# Patient Record
Sex: Male | Born: 1945 | Race: White | Hispanic: Refuse to answer | Smoking: Never smoker
Health system: Southern US, Community
[De-identification: ages and names within clinical notes are randomized; demographics above are authoritative.]

## PROBLEM LIST (undated history)

## (undated) DIAGNOSIS — C61 Malignant neoplasm of prostate: Secondary | ICD-10-CM

## (undated) DIAGNOSIS — C4490 Unspecified malignant neoplasm of skin, unspecified: Secondary | ICD-10-CM

## (undated) DIAGNOSIS — D0321 Melanoma in situ of right ear and external auricular canal: Secondary | ICD-10-CM

## (undated) HISTORY — PX: MOHS SURGERY: SUR867

## (undated) HISTORY — PX: PROSTATECTOMY: SHX69

---

## 2000-09-12 ENCOUNTER — Encounter (INDEPENDENT_AMBULATORY_CARE_PROVIDER_SITE_OTHER): Payer: Self-pay | Admitting: Specialist

## 2000-09-12 ENCOUNTER — Other Ambulatory Visit: Admission: RE | Admit: 2000-09-12 | Discharge: 2000-09-12 | Payer: Self-pay | Admitting: Urology

## 2003-12-28 ENCOUNTER — Ambulatory Visit (HOSPITAL_COMMUNITY): Admission: RE | Admit: 2003-12-28 | Discharge: 2003-12-28 | Payer: Self-pay | Admitting: Gastroenterology

## 2008-01-26 ENCOUNTER — Ambulatory Visit: Admission: RE | Admit: 2008-01-26 | Discharge: 2008-02-25 | Payer: Self-pay | Admitting: Radiation Oncology

## 2008-05-13 ENCOUNTER — Ambulatory Visit: Admission: RE | Admit: 2008-05-13 | Discharge: 2008-08-11 | Payer: Self-pay | Admitting: Radiation Oncology

## 2008-08-15 ENCOUNTER — Ambulatory Visit: Admission: RE | Admit: 2008-08-15 | Discharge: 2008-08-24 | Payer: Self-pay | Admitting: Radiation Oncology

## 2010-10-18 ENCOUNTER — Other Ambulatory Visit (HOSPITAL_COMMUNITY): Payer: Self-pay | Admitting: Otolaryngology

## 2010-10-18 ENCOUNTER — Encounter (HOSPITAL_COMMUNITY)
Admission: RE | Admit: 2010-10-18 | Discharge: 2010-10-18 | Disposition: A | Discharge status: Home / Self Care | Payer: Medicare Other | Source: Ambulatory Visit | Attending: Otolaryngology | Admitting: Otolaryngology

## 2010-10-18 LAB — URINALYSIS, ROUTINE W REFLEX MICROSCOPIC
Bilirubin Urine: NEGATIVE
Glucose, UA: NEGATIVE mg/dL
Hgb urine dipstick: NEGATIVE
Ketones, ur: NEGATIVE mg/dL
Nitrite: NEGATIVE
Protein, ur: NEGATIVE mg/dL
Specific Gravity, Urine: 1.026 (ref 1.005–1.030)
Urobilinogen, UA: 0.2 mg/dL (ref 0.0–1.0)
pH: 6.5 (ref 5.0–8.0)

## 2010-10-18 LAB — BASIC METABOLIC PANEL
BUN: 19 mg/dL (ref 6–23)
CO2: 27 mEq/L (ref 19–32)
Calcium: 8.8 mg/dL (ref 8.4–10.5)
Chloride: 108 mEq/L (ref 96–112)
Creatinine, Ser: 0.85 mg/dL (ref 0.4–1.5)
GFR calc Af Amer: >60 mL/min (ref >60)
GFR calc non Af Amer: >60 mL/min (ref >60)
Glucose, Bld: 123 mg/dL — ABNORMAL HIGH (ref 70–99)
Potassium: 4 mEq/L (ref 3.5–5.1)
Sodium: 138 mEq/L (ref 135–145)

## 2010-10-18 LAB — SURGICAL PCR SCREEN
MRSA, PCR: NEGATIVE
Staphylococcus aureus: NEGATIVE

## 2010-10-18 LAB — CBC
HCT: 40.4 % (ref 39.0–52.0)
Hemoglobin: 13.7 g/dL (ref 13.0–17.0)
MCH: 32.1 pg (ref 26.0–34.0)
MCHC: 33.9 g/dL (ref 30.0–36.0)
MCV: 94.6 fL (ref 78.0–100.0)
Platelets: 210 10*3/uL (ref 150–400)
RBC: 4.27 MIL/uL (ref 4.22–5.81)
RDW: 12.6 % (ref 11.5–15.5)
WBC: 3.6 10*3/uL — ABNORMAL LOW (ref 4.0–10.5)

## 2010-10-18 IMAGING — CR DG CHEST 2V
2 series · 2 of 2 positions shown · non-contrast
Comparison: None

CLINICAL DATA: Preop for nasal polyp biopsy.

CHEST - 2 VIEW

[view not recorded (1 of 2)]
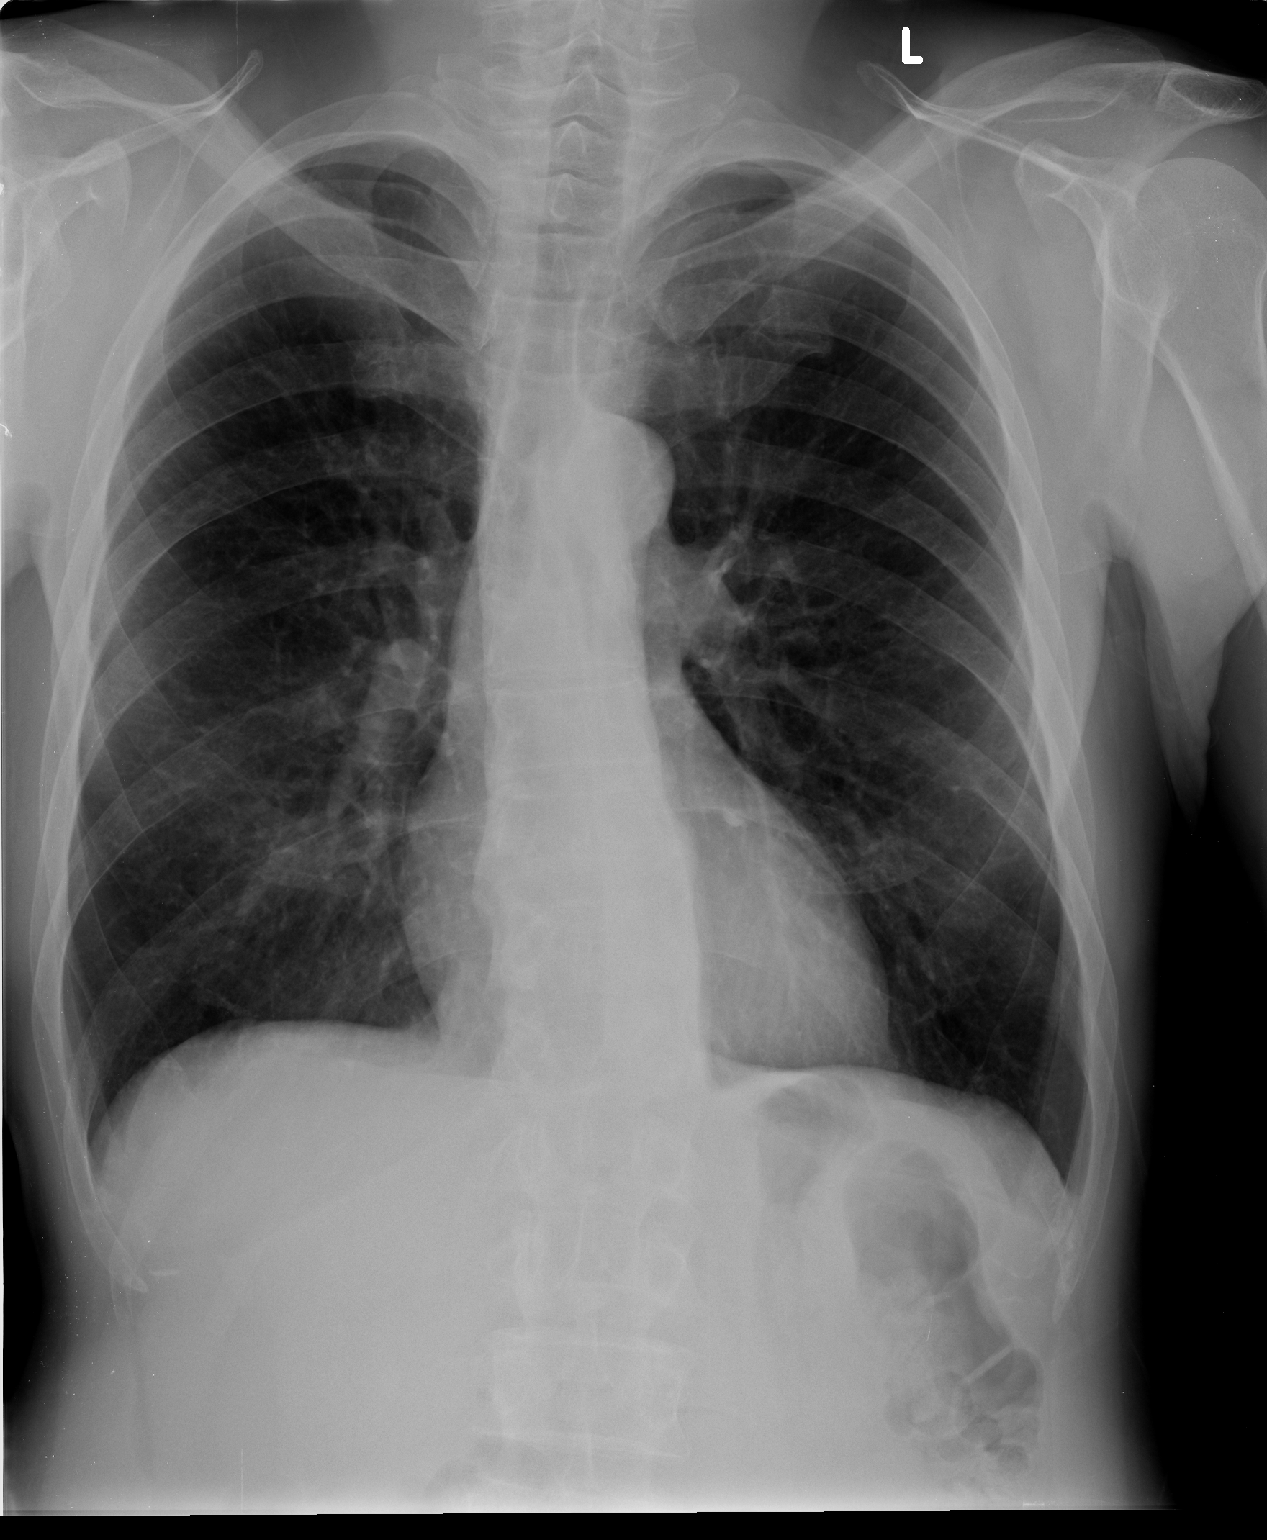

[view not recorded (2 of 2)]
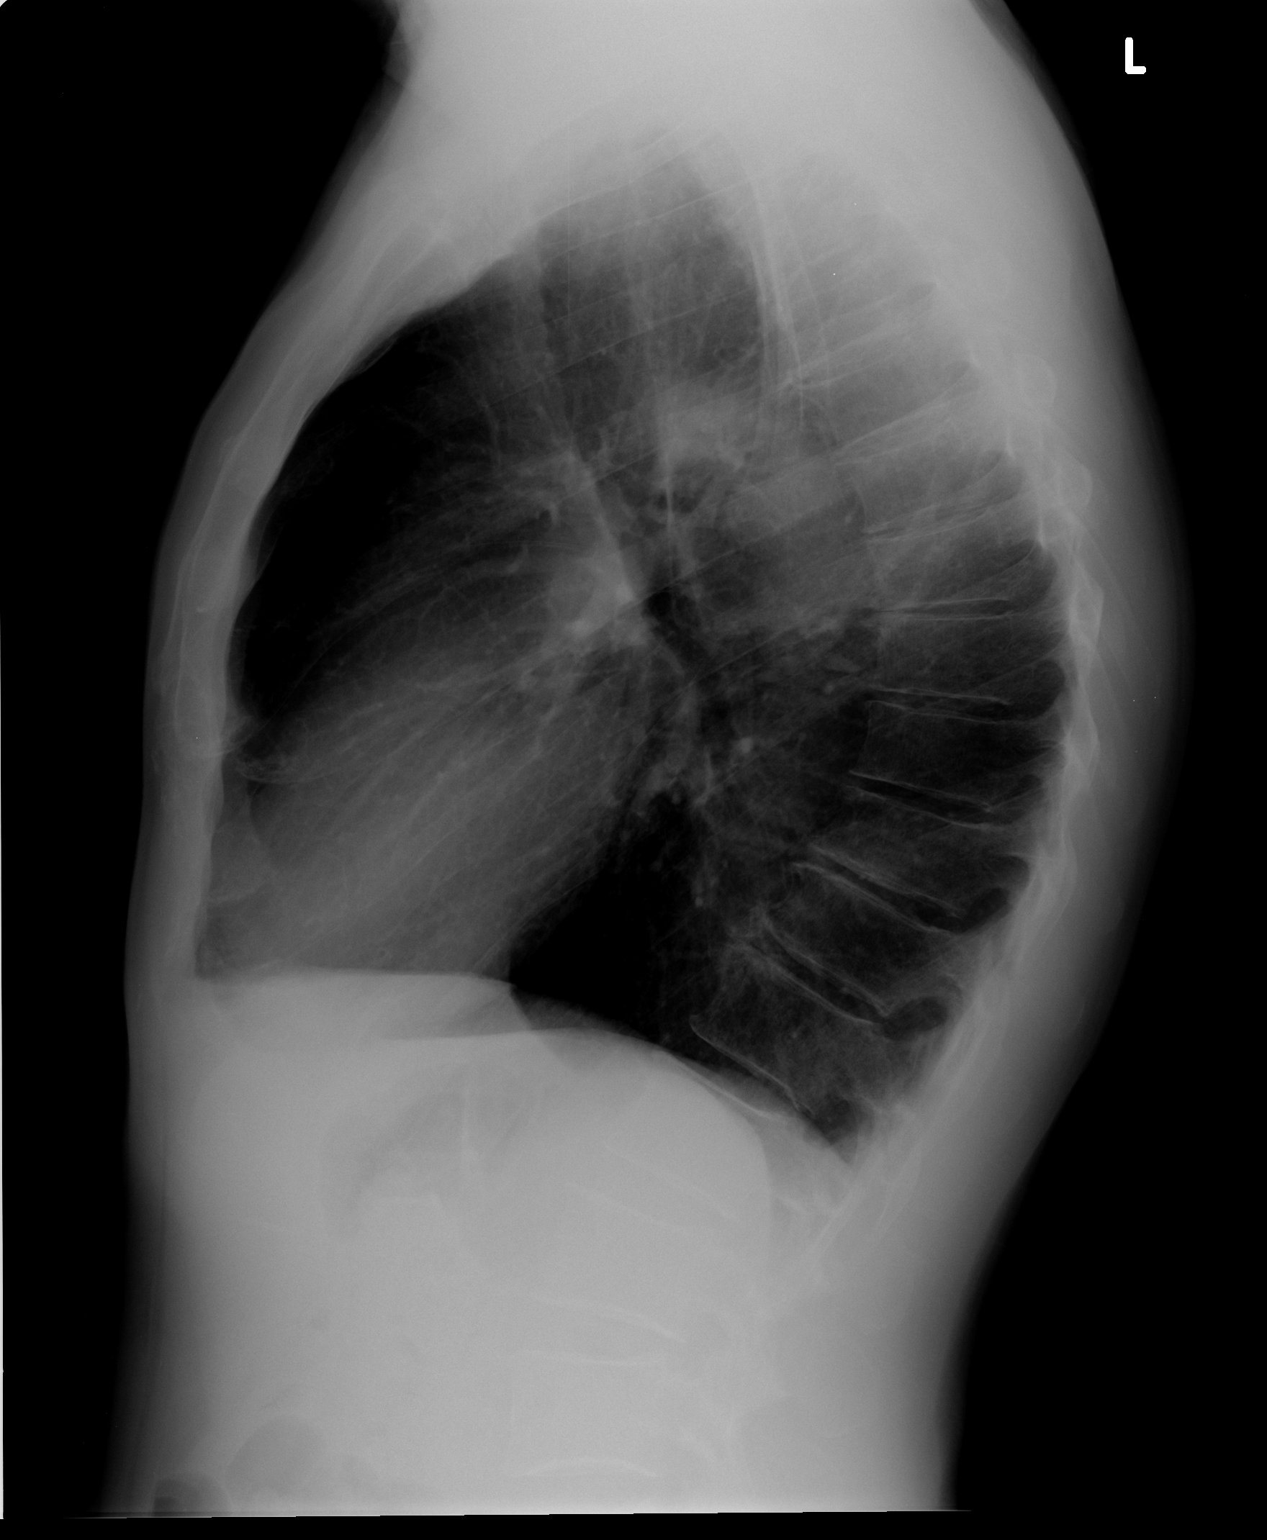

[2 of 2 positions shown; findings below may reference images not displayed]

FINDINGS: The cardiac silhouette, mediastinal and hilar contours
are within normal limits.  The lungs are clear.  The bony thorax is
intact
IMPRESSION: No acute cardiopulmonary findings.

## 2010-10-25 ENCOUNTER — Other Ambulatory Visit: Payer: Self-pay | Admitting: Otolaryngology

## 2010-10-25 ENCOUNTER — Ambulatory Visit (HOSPITAL_COMMUNITY)
Admission: RE | Admit: 2010-10-25 | Discharge: 2010-10-25 | Disposition: A | Discharge status: Home / Self Care | Payer: Medicare Other | Source: Ambulatory Visit | Attending: Otolaryngology | Admitting: Otolaryngology

## 2010-10-25 DIAGNOSIS — Z85840 Personal history of malignant neoplasm of eye: Secondary | ICD-10-CM | POA: Insufficient documentation

## 2010-10-25 DIAGNOSIS — Z01812 Encounter for preprocedural laboratory examination: Secondary | ICD-10-CM | POA: Insufficient documentation

## 2010-10-25 DIAGNOSIS — Z01818 Encounter for other preprocedural examination: Secondary | ICD-10-CM | POA: Insufficient documentation

## 2010-10-25 DIAGNOSIS — J338 Other polyp of sinus: Secondary | ICD-10-CM | POA: Insufficient documentation

## 2010-11-01 NOTE — H&P (Signed)
NAME:  Paul Vasquez, Paul Vasquez NO.:  [id number]  MEDICAL RECORD NO.:  [id number]           PATIENT TYPE:  LOCATION:                                 FACILITY:  PHYSICIAN:  Hermelinda Medicus, M.D.   DATE OF BIRTH:  1945/09/08  DATE OF ADMISSION: DATE OF DISCHARGE:                             HISTORY & PHYSICAL   This patient is a 65 year old male who has had a 2-year history of left maxillary sinusitis.  He had been seen by another ear, nose, and throat physician as well and has been followed there, but he brought his films to me and wanted to see if we can get this problem solved.  He on examination has a very aggressive looking left maxillary ethmoid region, nasal polyp, and on reviewing of his CAT scan, he also had involvement of left maxillary sinus.  There appeared to be erosion of the bone in a 64 unilateral lesion.  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  He did not have a lot of pain, but he was concerned about these sinusitis infections and with the nasal obstruction and with the persistent congestion and sinus infections, I treated him with antibiotics with little improvement, and after reviewing his films, my recommendation is that he would have a left maxillary sinus ostial enlargement with excisional biopsy with frozen section of the nasal maxillary, ethmoid polyp and clearing of this maxillary sinus, would drain it  appropriately.  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  He has not had any other difficulties with this.  He has also had other past surgeries.  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  He also had a prostate surgery for cancer, elbow ORIF on the right, and right hernia. He also has a history of anxiety.  He had apparently some bleeding from his left adrenal gland which has been followed up by his medical  doctor.  PHYSICAL EXAMINATION:  VITAL SIGNS:  Blood pressure of 142/78, pulse 70. The ears are clear. HEENT:  The tympanic membranes look excellent and move well.  External ear canals are also clear.  The right nose is clear.  He has a small spur on the right side, but I felt it was not pertinent to this problem. The left side has the blocked nasal airway with a large irregular- looking nasal polyp which extends into the maxillary sinus causing maxillary sinusitis with the appearance of inverted papilloma.  The nasopharynx is clear of any ulceration, mass, or lesion.  His larynx is clear.  True cords, false cords, epiglottis, base of tongue, lateral pharyngeal walls are completely clear of ulceration, mass, or lesion and true cord mobility, gag reflex, tongue mobility, EOMs, facial nerve are all symmetrical.  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 GENERAL:  Oriented x3. CHEST:  Clear.  No rales, rhonchi, or wheezes. CARDIOVASCULAR:  Normal S1 and S2.  No murmurs or gallops.  INITIAL DIAGNOSES: 1. 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 Plan is to do functional endoscopic sinus surgery, biopsy excision of this lesion, frozen section to rule out and then if it is an inverted papilloma, excise this through endoscopic procedure. 1. Right eye enucleation for melanoma. 2. History of a adrenal tumor, treated. 3. Right elbow and right hernia repair.  Our initial plan is to do the functional endoscopic sinus surgery, biopsy, frozen section, excise the polyp and if it is inverted papilloma, then aggressively remove this via endoscopic.          ______________________________ Hermelinda Medicus, M.D.     JC/MEDQ  D:  10/25/2010  T:  10/26/2010  Job:  [id number]  cc:   Gloriajean Dell. Andrey Campanile, M.D.  Electronically Signed by Hermelinda Medicus M.D. on 11/01/2010 10:23:40 AM

## 2010-11-01 NOTE — Op Note (Signed)
NAME:  Paul Vasquez, Paul Vasquez NO.:  [id number]  MEDICAL RECORD NO.:  [id number]           PATIENT TYPE:  LOCATION:                                 FACILITY:  PHYSICIAN:  Hermelinda Medicus, M.D.   DATE OF BIRTH:  October 24, 1945  DATE OF PROCEDURE: DATE OF DISCHARGE:                              OPERATIVE REPORT   PREOPERATIVE DIAGNOSIS:  Left maxillary sinus polyp involving the nasal vestibule as well as the sinus itself and next to the middle turbinate, ethmoid sinus appears to be clear, and rule-out inverted papilloma.  POSTOPERATIVE DIAGNOSIS:  Left maxillary sinus polyp involving the nasal vestibule as well as the sinus itself and next to the middle turbinate, ethmoid sinus appears to be clear, and rule-out inverted papilloma.  OPERATOR:  Hermelinda Medicus, MD  PROCEDURE:  Functional endoscopic sinus surgery with a frozen section biopsy of left maxillary sinus and nasal polyp with functional endoscopic review of the entire maxillary sinus antrostomy and then excision and removal of this area of the polyp via backbiting forceps and Blakesley-Wilde under the zero 130-degree scope.  ANESTHESIA:  Local MAC, 1% Xylocaine with epinephrine 4 mL topical cocaine 200 mg.  ANESTHESIOLOGIST:  Dr. Jacklynn Bue.  PROCEDURE:  The patient was placed in supine position under local MAC anesthesia, being very careful, and considering he has just one eye the left was functioning and we are operating on this left side.  The nose was anesthetized using 1% Xylocaine with epinephrine and topical cocaine 200 mg.  The polyp was visualized very clearly and this was immediately excised and sent for frozen section to be seen by Dr. Nedra Hai and she called back on frozen section with a diagnosis of inflammatory and fungal, but no evidence of tumor and no evidence of inverted papilloma.  We during this time waiting, we opened the left maxillary sinus ostium using the backbiting forceps and angled  Blakesley-Wilde, we viewed inside the sinus with a zero 130-degree scope and the mucous membrane inside was thickened, but did not appear to have this angry irregular appearance that the medial wall of maxillary sinus showed.  We obviously took more tissue for a permanent section and we did an antrostomy to be able to view the entire sinus and once this was completed we also medialized the middle turbinate on the left and also reduced the both sides of the inferior turbinates and used the awl met at 12 to cauterize the anterior portion of the turbinates.  Once this was completed, we placed a small piece of Gelfoam up by the area of the maxillary sinus and natural ostium and then placed a 12-mm Merocel pack on that left side.  The patient tolerated procedure very well.  His vision is totally normal and he is to be permitted to go home, talk to his wife about the care postop.  He is to return to see me in 1 day and I will remove this packing tomorrow in the office.  This follow up then will be in 1 week, 3 weeks, 6 weeks, 3 months, 6 months, and a year.  ______________________________ Hermelinda Medicus, M.D.     JC/MEDQ  D:  10/25/2010  T:  10/26/2010  Job:  [id number]  cc:   Gloriajean Dell. Andrey Campanile, M.D.  Electronically Signed by Hermelinda Medicus M.D. on 11/01/2010 10:23:44 AM

## 2010-12-28 NOTE — Op Note (Signed)
NAME:  Paul Vasquez, Paul Vasquez                       ACCOUNT NO.:  [id number]   MEDICAL RECORD NO.:  [id number]                   PATIENT TYPE:  AMB   LOCATION:  ENDO                                 FACILITY:  [facility]   PHYSICIAN:  Anselmo Rod, M.D.               DATE OF BIRTH:  [birth year]   DATE OF PROCEDURE:  12/28/2003  DATE OF DISCHARGE:                                 OPERATIVE REPORT   PROCEDURE PERFORMED:  Screening colonoscopy.   ENDOSCOPIST:  Charna Elizabeth, M.D.   INSTRUMENT USED:  Olympus video colonoscope.   INDICATIONS FOR PROCEDURE:  The patient is a 65 year old white male with a  personal history of prostate cancer and melanoma, status post enucleation of  the right eye in the past with a prosthetic right eye at the present time,  undergoing screening colonoscopy to rule out colonic polyps, masses, etc.   PREPROCEDURE PREPARATION:  Informed consent was procured from the patient.  The patient was fasted for eight hours prior to the procedure and prepped  with a bottle of magnesium citrate and a gallon of GoLYTELY the night prior  to the procedure.   PREPROCEDURE PHYSICAL:  The patient had stable vital signs.  Neck supple.  Chest clear to auscultation.  S1 and S2 regular.  Abdomen soft with normal  bowel sounds.   DESCRIPTION OF PROCEDURE:  The patient was placed in left lateral decubitus  position and sedated with 90 mg of Demerol and 9 mg of Versed intravenously.  Once the patient was adequately sedated and maintained on low flow oxygen  and continuous cardiac monitoring, the Olympus video colonoscope was  advanced from the rectum to the cecum.  The appendicular orifice and  ileocecal valve were clearly visualized and photographed.  There was some  residual debris in the colon and multiple washes were done.  The patient's  position was changed from the left lateral to the supine position with  gentle application of abdominal pressure to reach the cecum.  No masses,  polyps, erosions, ulcerations or diverticula were seen.  Small internal  hemorrhoids were seen on retroflexion in the rectum.  The patient tolerated  the procedure well without immediate complications.   IMPRESSION:  Normal colonoscopy up to the cecum except for small internal  hemorrhoids.   RECOMMENDATIONS:  1. Continue high fiber diet with liberal fluid intake.  2. Repeat colorectal cancer screening is recommended in the next five years     unless the patient develops any abnormal symptoms in the interim.  3. Outpatient followup as need arises in the future.                                               Anselmo Rod, M.D.    JNM/MEDQ  D:  12/28/2003  T:  12/28/2003  Job:  [id number]   cc:   Gloriajean Dell. Andrey Campanile, M.D.  [address]  [place]  [place] [address]  Fax: [phone]

## 2015-10-11 DIAGNOSIS — I10 Essential (primary) hypertension: Secondary | ICD-10-CM | POA: Insufficient documentation

## 2015-10-11 DIAGNOSIS — L570 Actinic keratosis: Secondary | ICD-10-CM | POA: Insufficient documentation

## 2015-10-11 DIAGNOSIS — Z0000 Encounter for general adult medical examination without abnormal findings: Secondary | ICD-10-CM | POA: Insufficient documentation

## 2015-10-11 DIAGNOSIS — Q809 Congenital ichthyosis, unspecified: Secondary | ICD-10-CM | POA: Insufficient documentation

## 2015-10-11 DIAGNOSIS — L300 Nummular dermatitis: Secondary | ICD-10-CM | POA: Insufficient documentation

## 2015-10-11 DIAGNOSIS — C61 Malignant neoplasm of prostate: Secondary | ICD-10-CM | POA: Insufficient documentation

## 2015-10-11 DIAGNOSIS — H548 Legal blindness, as defined in USA: Secondary | ICD-10-CM | POA: Insufficient documentation

## 2017-05-22 DIAGNOSIS — Z9001 Acquired absence of eye: Secondary | ICD-10-CM | POA: Insufficient documentation

## 2017-09-08 ENCOUNTER — Other Ambulatory Visit (HOSPITAL_COMMUNITY): Payer: Self-pay | Admitting: Urology

## 2017-09-08 DIAGNOSIS — C61 Malignant neoplasm of prostate: Secondary | ICD-10-CM

## 2017-10-17 ENCOUNTER — Encounter (HOSPITAL_COMMUNITY)
Admission: RE | Admit: 2017-10-17 | Discharge: 2017-10-17 | Disposition: A | Discharge status: Home / Self Care | Payer: Medicare Other | Source: Ambulatory Visit | Attending: Urology | Admitting: Urology

## 2017-10-17 DIAGNOSIS — C61 Malignant neoplasm of prostate: Secondary | ICD-10-CM | POA: Insufficient documentation

## 2017-10-17 IMAGING — NM NM BONE WHOLE BODY
2 series · 2 of 2 positions shown · non-contrast
Comparison: None.

CLINICAL DATA: 71-year-old male with prostate cancer.

EXAM:
NUCLEAR MEDICINE WHOLE BODY BONE SCAN
TECHNIQUE: Whole body anterior and posterior images were obtained approximately
3 hours after intravenous injection of radiopharmaceutical.
RADIOPHARMACEUTICALS:  20.9 mCi [NM] MDP IV

[Series 1: whole body · 2.66mm/px · 1 of 1 slices shown (1 of 2)]
[im 1/1]
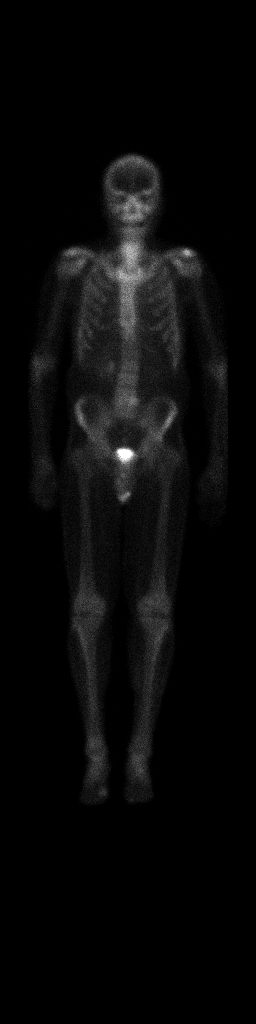

[Series 1: whole body · 2.66mm/px · 1 of 1 slices shown (2 of 2)]
[im 1/1]
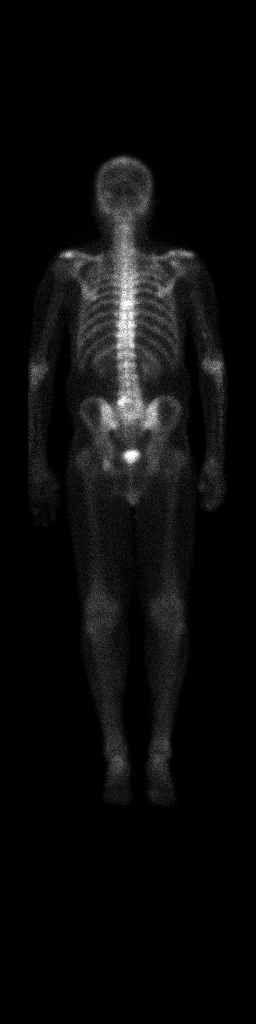

[2 of 2 positions shown; findings below may reference images not displayed]

FINDINGS: Focal increased activity within the posterior LEFT pubis/ischium
noted.

Focal increased activity within the posterior LEFT LOWER lumbar
spine noted.

Focal increased activity in the region of the distal LEFT
clavicle/AC joint noted.

No other focal areas of abnormal bony activity noted.
IMPRESSION: Focal areas of increased activity within the posterior LEFT
pubis/ischium, posterior LEFT LOWER lumbar spine and distal LEFT
clavicle/AC joint. Recommend radiographs of these areas for
evaluation of metastatic disease.

## 2017-10-17 MED ORDER — TECHNETIUM TC 99M MEDRONATE IV KIT
20.9000 | PACK | Freq: Once | INTRAVENOUS | Status: AC | PRN
  Administered 2017-10-17: 20.9 via INTRAVENOUS

## 2018-06-15 ENCOUNTER — Other Ambulatory Visit: Payer: Self-pay | Admitting: Family Medicine

## 2018-06-15 DIAGNOSIS — M810 Age-related osteoporosis without current pathological fracture: Secondary | ICD-10-CM

## 2018-08-20 ENCOUNTER — Ambulatory Visit
Admission: RE | Admit: 2018-08-20 | Discharge: 2018-08-20 | Disposition: A | Discharge status: Home / Self Care | Payer: Medicare Other | Source: Ambulatory Visit | Attending: Family Medicine | Admitting: Family Medicine

## 2018-08-20 DIAGNOSIS — M810 Age-related osteoporosis without current pathological fracture: Secondary | ICD-10-CM

## 2018-10-04 DIAGNOSIS — M810 Age-related osteoporosis without current pathological fracture: Secondary | ICD-10-CM | POA: Insufficient documentation

## 2019-03-31 DIAGNOSIS — D485 Neoplasm of uncertain behavior of skin: Secondary | ICD-10-CM | POA: Insufficient documentation

## 2019-04-29 ENCOUNTER — Encounter: Payer: Self-pay | Admitting: *Deleted

## 2019-04-30 ENCOUNTER — Other Ambulatory Visit: Payer: Self-pay

## 2019-04-30 ENCOUNTER — Ambulatory Visit
Admission: RE | Admit: 2019-04-30 | Discharge: 2019-04-30 | Disposition: A | Discharge status: Home / Self Care | Payer: Medicare Other | Source: Ambulatory Visit | Attending: Radiation Oncology | Admitting: Radiation Oncology

## 2019-04-30 ENCOUNTER — Encounter: Payer: Self-pay | Admitting: Radiation Oncology

## 2019-04-30 VITALS — Ht 67.0 in | Wt 138.0 lb

## 2019-04-30 DIAGNOSIS — C44329 Squamous cell carcinoma of skin of other parts of face: Secondary | ICD-10-CM

## 2019-04-30 DIAGNOSIS — C44320 Squamous cell carcinoma of skin of unspecified parts of face: Secondary | ICD-10-CM

## 2019-04-30 HISTORY — DX: Unspecified malignant neoplasm of skin, unspecified: C44.90

## 2019-04-30 HISTORY — DX: Melanoma in situ of right ear and external auricular canal: D03.21

## 2019-04-30 HISTORY — DX: Malignant neoplasm of prostate: C61

## 2019-04-30 NOTE — Progress Notes (Signed)
See progress note under physician encounter. 

## 2019-04-30 NOTE — Progress Notes (Signed)
Radiation Oncology         [phone] ________________________________  Initial outpatient Consultation - Conducted via Telephone due to current COVID-19 concerns for limiting patient exposure  Name: Paul Vasquez MRN: [id number]  Date: 04/30/2019  DOB: August 04, 1946  CC:[name], MD  [name], MD   REFERRING PHYSICIAN: [name], MD  DIAGNOSIS: 73 y.o. 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 Squamous cell carcinoma of skin of temple region  C44.320   2. Squamous cell carcinoma of face  C44.320     HISTORY OF PRESENT ILLNESS: Paul Vasquez is a 73 y.o. male with a diagnosis of invasive squamous cell skin cancer. He has a history of prostate cancer, s/p radical retroperitoneal prostatectomy with Dr. Cherly Hensen at [facility] in 2003 followed by salvage radiation to the prostate fossa in 2009 here in our cinic.  He has continued in follow up with Dr. Alinda Money since that time and was recently started on Lupron ADT for rising PSA in 05/2018 which he tolerates well. He presented to his PCP with a tender spot on his right temple that continued to flare up over approximately 6 month period. He had a shave biopsy performed on 04/01/2019 which revealed invasive squamous cell carcinoma, extending to deep and lateral margins. He was referred to Dr. Danielle Dess at [place], who performed Mohs surgery on 04/22/2019. Final pathology confirmed pT2b invasive squamous cell carcinoma with perineural invasion but all margins free of tumor.    Of note, the patient reports a history of melanoma behind the right retina approximately 40+ years ago which was treated with surgical removal of the right eye with replacement of a prosthetic eye.  The patient reviewed the biopsy results with his dermatologist and he has kindly been referred today for discussion of potential radiation treatment options.  PREVIOUS RADIATION  THERAPY: Yes  07/2008 - 08/2008: Prostate Fossa / 68.4 Gy in 38 fractions  PAST MEDICAL HISTORY:  Past Medical History:  Diagnosis Date  . Melanoma in situ of ear, right ([place])   . Prostate cancer ([place])   . Skin cancer       PAST SURGICAL HISTORY: Past Surgical History:  Procedure Laterality Date  . MOHS SURGERY    . PROSTATECTOMY      FAMILY HISTORY:  Family History  Problem Relation Age of Onset  . Multiple myeloma Father     SOCIAL HISTORY:  Social History   Socioeconomic History  . Marital status: Married    Spouse name: Not on file  . Number of children: Not on file  . Years of education: Not on file  . Highest education level: Not on file  Occupational History    Comment: retired  Scientific laboratory technician  . Financial resource strain: Not on file  . Food insecurity    Worry: Not on file    Inability: Not on file  . Transportation needs    Medical: Not on file    Non-medical: Not on file  Tobacco Use  . Smoking status: Never Smoker  . Smokeless tobacco: Never Used  Substance and Sexual Activity  . Alcohol use: Not Currently  . Drug use: Never  . Sexual activity: Not Currently  Lifestyle  . Physical activity    Days per week: Not on file    Minutes per session: Not on file  . Stress: Not on file  Relationships  . Social  connections    Talks on phone: Not on file    Gets together: Not on file    Attends religious service: Not on file    Active member of club or organization: Not on file    Attends meetings of clubs or organizations: Not on file    Relationship status: Not on file  . Intimate partner violence    Fear of current or ex partner: Not on file    Emotionally abused: Not on file    Physically abused: Not on file    Forced sexual activity: Not on file  Other Topics Concern  . Not on file  Social History Narrative  . Not on file    ALLERGIES: Amoxicillin and Sulfamethoxazole  MEDICATIONS:  Current Outpatient Medications  Medication Sig  Dispense Refill  . calcium-vitamin D (OSCAL WITH D) 500-200 MG-UNIT tablet Take 1 tablet by mouth.    . NON FORMULARY Raw Calcium daily    . NON FORMULARY Immune stimulator daily    . NON FORMULARY Skeletal strength daily    . NON FORMULARY Growth factor daily    . NON FORMULARY Wheat grass daily    . Probiotic Product (SOLUBLE FIBER/PROBIOTICS PO) Take by mouth.    . vitamin C (ASCORBIC ACID) 500 MG tablet Take 500 mg by mouth 2 (two) times daily.     No current facility-administered medications for this encounter.     REVIEW OF SYSTEMS:  On review of systems, the patient reports that he is doing well overall.  He feels that his surgical incision is healing appropriately and denies pain or active drainage or bleeding.  He does report brow proptosis on the right since the time of surgery.  He denies any chest pain, shortness of breath, cough, fevers, chills, night sweats, or recent unintended weight changes. He denies any bowel disturbances, and denies abdominal pain, nausea or vomiting. He denies any new musculoskeletal or joint aches or pains. A complete review of systems is obtained and is otherwise negative.    PHYSICAL EXAM:  Wt Readings from Last 3 Encounters:  04/30/19 138 lb (62.6 kg)   Temp Readings from Last 3 Encounters:  No data found for Temp   BP Readings from Last 3 Encounters:  No data found for BP   Pulse Readings from Last 3 Encounters:  No data found for Pulse   Pain Assessment Pain Score: 1  Pain Frequency: Occasional Pain Loc: Face(touch of sensitivity right temple s/p  MOH's surgery)/10  Unable to assess due to telephone consult visit format   KPS = 90  100 - Normal; no complaints; no evidence of disease. 90   - Able to carry on normal activity; minor signs or symptoms of disease. 80   - Normal activity with effort; some signs or symptoms of disease. 56   - Cares for self; unable to carry on normal activity or to do active work. 60   - Requires  occasional assistance, but is able to care for most of his personal needs. 50   - Requires considerable assistance and frequent medical care. 30   - Disabled; requires special care and assistance. 36   - Severely disabled; hospital admission is indicated although death not imminent. 73   - Very sick; hospital admission necessary; active supportive treatment necessary. 10   - Moribund; fatal processes progressing rapidly. 0     - Dead  Karnofsky DA, Abelmann WH, Craver LS and Burchenal [facility] [phone]) The use of the nitrogen  mustards in the palliative treatment of carcinoma: with particular reference to bronchogenic carcinoma Cancer 1 [phone]  LABORATORY DATA:  Lab Results  Component Value Date   WBC 3.6 (L) 10/18/2010   HGB 13.7 10/18/2010   HCT 40.4 10/18/2010   MCV 94.6 10/18/2010   PLT 210 10/18/2010   Lab Results  Component Value Date   NA 138 10/18/2010   K 4.0 10/18/2010   CL 108 10/18/2010   CO2 27 10/18/2010   No results found for: ALT, AST, GGT, ALKPHOS, BILITOT   RADIOGRAPHY: No results found.    IMPRESSION/PLAN: 1. 73 y.o. gentleman with Stage T2b invasive, cutaneous squamous cell carcinoma of the right temple.  Today, we talked to the patient about the findings and workup thus far. We discussed the natural history of invasive squamous cell carcinoma and general treatment, highlighting the role of adjuvant radiotherapy in the management. We discussed the available radiation techniques, and focused on the details of logistics and delivery.  The recommendation is to proceed with a 6-week course of daily radiotherapy focused on the surgical site at the right temple once he is 3 to 4 weeks postop.  We reviewed the anticipated acute and late sequelae associated with radiation in this setting. The patient was encouraged to ask questions that were answered to his stated satisfaction.  At the conclusion of our conversation, the patient is interested in proceeding with adjuvant  radiation therapy directed to the surgical site at the right temple. He has provided verbal consent today and is scheduled for CT simulation/treatment planning on 05/11/2019 in anticipation of beginning his daily radiation treatments on 05/18/2019.  He appears to have a good understanding of his disease and our recommendations which are of curative intent.  He is comfortable and in agreement with the stated plan.  We will share our discussion with Dr. Danielle Dess and Dr. Kathryne Eriksson.  He knows to call at anytime with any questions or concerns in the interim.  Given current concerns for patient exposure during the COVID-19 pandemic, this encounter was conducted via telephone. The patient was notified in advance and was offered a MyChart meeting to allow for face to face communication but unfortunately reported that he did not have the appropriate resources/technology to support such a visit and instead preferred to proceed with telephone consult. The patient has given verbal consent for this type of encounter. The time spent during this encounter was 55 minutes. The attendants for this meeting include [name] MD, Ashlyn Bruning PA-C, Katie Larkfield[place], and patient, Camilo "CHARLIE" Lysbeth Galas. During the encounter, [name] MD, Ashlyn Bruning PA-C, and scribe, Wilburn Mylar were located at [place].  Patient, Abanoub "CHARLIE" Malerba was located at home.    [name], PA-C    [name], MD  [facility] Direct Dial: [phone]  Fax: [phone] [email or web address]  Skype  [email or web address]  This document serves as a record of services personally performed by [name], MD and Freeman Caldron, PA-C. It was created on their behalf by Wilburn Mylar, a trained medical scribe. The creation of this record is based on the scribe's personal observations and the provider's statements to them. This document  has been checked and approved by the attending provider.
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
## 2019-05-04 ENCOUNTER — Telehealth: Payer: Self-pay | Admitting: *Deleted

## 2019-05-04 NOTE — Telephone Encounter (Signed)
Faxed copy of consult note to Dr. Janan Ridge of [place] [phone].

## 2019-05-11 ENCOUNTER — Ambulatory Visit
Admission: RE | Admit: 2019-05-11 | Discharge: 2019-05-11 | Disposition: A | Discharge status: Home / Self Care | Payer: Medicare Other | Source: Ambulatory Visit | Attending: Radiation Oncology | Admitting: Radiation Oncology

## 2019-05-11 ENCOUNTER — Other Ambulatory Visit: Payer: Self-pay

## 2019-05-11 DIAGNOSIS — C44320 Squamous cell carcinoma of skin of unspecified parts of face: Secondary | ICD-10-CM | POA: Diagnosis not present

## 2019-05-13 NOTE — Progress Notes (Signed)
  Radiation Oncology         [phone]) [phone] ________________________________  Name: Paul Vasquez MRN: [id number]  Date: 05/11/2019  DOB: 07/06/1946  SIMULATION AND TREATMENT PLANNING NOTE    ICD-10-CM   1. 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  Identity was confirmed.  All relevant records and images related to the planned course of therapy were reviewed.  The patient freely provided informed written consent to proceed with treatment after reviewing the details related to the planned course of therapy. The consent form was witnessed and verified by the simulation staff.  Then, the patient was set-up in a stable reproducible  supine position for radiation therapy.  CT images were obtained.  Surface markings were placed.  The CT images were loaded into the planning software.  Then the target and avoidance structures were contoured.  Treatment planning then occurred.  The radiation prescription was entered and confirmed.  Then, I designed and supervised the construction of a total of 3 medically necessary complex treatment devices with mask, electron cut out and bolus.  I have requested : Isodose Plan.  PLAN:  The patient will receive 60 Gy in 30 fractions.  ________________________________  Sheral Apley Tammi Klippel, M.D.

## 2019-05-17 DIAGNOSIS — C44320 Squamous cell carcinoma of skin of unspecified parts of face: Secondary | ICD-10-CM | POA: Diagnosis not present

## 2019-05-18 ENCOUNTER — Other Ambulatory Visit: Payer: Self-pay

## 2019-05-18 ENCOUNTER — Ambulatory Visit
Admission: RE | Admit: 2019-05-18 | Discharge: 2019-05-18 | Disposition: A | Discharge status: Home / Self Care | Payer: Medicare Other | Source: Ambulatory Visit | Attending: Radiation Oncology | Admitting: Radiation Oncology

## 2019-05-18 DIAGNOSIS — C44320 Squamous cell carcinoma of skin of unspecified parts of face: Secondary | ICD-10-CM | POA: Diagnosis not present

## 2019-05-19 ENCOUNTER — Ambulatory Visit
Admission: RE | Admit: 2019-05-19 | Discharge: 2019-05-19 | Disposition: A | Discharge status: Home / Self Care | Payer: Medicare Other | Source: Ambulatory Visit | Attending: Radiation Oncology | Admitting: Radiation Oncology

## 2019-05-19 ENCOUNTER — Other Ambulatory Visit: Payer: Self-pay

## 2019-05-19 DIAGNOSIS — C44320 Squamous cell carcinoma of skin of unspecified parts of face: Secondary | ICD-10-CM | POA: Diagnosis not present

## 2019-05-20 ENCOUNTER — Ambulatory Visit
Admission: RE | Admit: 2019-05-20 | Discharge: 2019-05-20 | Disposition: A | Discharge status: Home / Self Care | Payer: Medicare Other | Source: Ambulatory Visit | Attending: Radiation Oncology | Admitting: Radiation Oncology

## 2019-05-20 ENCOUNTER — Other Ambulatory Visit: Payer: Self-pay

## 2019-05-20 DIAGNOSIS — C44320 Squamous cell carcinoma of skin of unspecified parts of face: Secondary | ICD-10-CM | POA: Diagnosis not present

## 2019-05-21 ENCOUNTER — Other Ambulatory Visit: Payer: Self-pay

## 2019-05-21 ENCOUNTER — Ambulatory Visit
Admission: RE | Admit: 2019-05-21 | Discharge: 2019-05-21 | Disposition: A | Discharge status: Home / Self Care | Payer: Medicare Other | Source: Ambulatory Visit | Attending: Radiation Oncology | Admitting: Radiation Oncology

## 2019-05-21 DIAGNOSIS — C44320 Squamous cell carcinoma of skin of unspecified parts of face: Secondary | ICD-10-CM | POA: Diagnosis not present

## 2019-05-21 MED ORDER — SONAFINE EX EMUL
1.0000 "application " | Freq: Once | CUTANEOUS | Status: AC
  Administered 2019-05-21: 1 via TOPICAL

## 2019-05-24 ENCOUNTER — Other Ambulatory Visit: Payer: Self-pay

## 2019-05-24 ENCOUNTER — Ambulatory Visit
Admission: RE | Admit: 2019-05-24 | Discharge: 2019-05-24 | Disposition: A | Discharge status: Home / Self Care | Payer: Medicare Other | Source: Ambulatory Visit | Attending: Radiation Oncology | Admitting: Radiation Oncology

## 2019-05-24 DIAGNOSIS — C44320 Squamous cell carcinoma of skin of unspecified parts of face: Secondary | ICD-10-CM | POA: Diagnosis not present

## 2019-05-25 ENCOUNTER — Ambulatory Visit
Admission: RE | Admit: 2019-05-25 | Discharge: 2019-05-25 | Disposition: A | Discharge status: Home / Self Care | Payer: Medicare Other | Source: Ambulatory Visit | Attending: Radiation Oncology | Admitting: Radiation Oncology

## 2019-05-25 ENCOUNTER — Other Ambulatory Visit: Payer: Self-pay

## 2019-05-25 DIAGNOSIS — C44320 Squamous cell carcinoma of skin of unspecified parts of face: Secondary | ICD-10-CM | POA: Diagnosis not present

## 2019-05-26 ENCOUNTER — Other Ambulatory Visit: Payer: Self-pay

## 2019-05-26 ENCOUNTER — Ambulatory Visit
Admission: RE | Admit: 2019-05-26 | Discharge: 2019-05-26 | Disposition: A | Discharge status: Home / Self Care | Payer: Medicare Other | Source: Ambulatory Visit | Attending: Radiation Oncology | Admitting: Radiation Oncology

## 2019-05-26 DIAGNOSIS — C44320 Squamous cell carcinoma of skin of unspecified parts of face: Secondary | ICD-10-CM | POA: Diagnosis not present

## 2019-05-27 ENCOUNTER — Other Ambulatory Visit: Payer: Self-pay

## 2019-05-27 ENCOUNTER — Ambulatory Visit
Admission: RE | Admit: 2019-05-27 | Discharge: 2019-05-27 | Disposition: A | Discharge status: Home / Self Care | Payer: Medicare Other | Source: Ambulatory Visit | Attending: Radiation Oncology | Admitting: Radiation Oncology

## 2019-05-27 DIAGNOSIS — C44320 Squamous cell carcinoma of skin of unspecified parts of face: Secondary | ICD-10-CM | POA: Diagnosis not present

## 2019-05-28 ENCOUNTER — Ambulatory Visit
Admission: RE | Admit: 2019-05-28 | Discharge: 2019-05-28 | Disposition: A | Discharge status: Home / Self Care | Payer: Medicare Other | Source: Ambulatory Visit | Attending: Radiation Oncology | Admitting: Radiation Oncology

## 2019-05-28 ENCOUNTER — Other Ambulatory Visit: Payer: Self-pay

## 2019-05-28 DIAGNOSIS — C44320 Squamous cell carcinoma of skin of unspecified parts of face: Secondary | ICD-10-CM | POA: Diagnosis not present

## 2019-05-31 ENCOUNTER — Other Ambulatory Visit: Payer: Self-pay

## 2019-05-31 ENCOUNTER — Ambulatory Visit
Admission: RE | Admit: 2019-05-31 | Discharge: 2019-05-31 | Disposition: A | Discharge status: Home / Self Care | Payer: Medicare Other | Source: Ambulatory Visit | Attending: Radiation Oncology | Admitting: Radiation Oncology

## 2019-05-31 DIAGNOSIS — C44320 Squamous cell carcinoma of skin of unspecified parts of face: Secondary | ICD-10-CM | POA: Diagnosis not present

## 2019-06-01 ENCOUNTER — Ambulatory Visit
Admission: RE | Admit: 2019-06-01 | Discharge: 2019-06-01 | Disposition: A | Discharge status: Home / Self Care | Payer: Medicare Other | Source: Ambulatory Visit | Attending: Radiation Oncology | Admitting: Radiation Oncology

## 2019-06-01 ENCOUNTER — Other Ambulatory Visit: Payer: Self-pay

## 2019-06-01 DIAGNOSIS — C44320 Squamous cell carcinoma of skin of unspecified parts of face: Secondary | ICD-10-CM | POA: Diagnosis not present

## 2019-06-02 ENCOUNTER — Ambulatory Visit
Admission: RE | Admit: 2019-06-02 | Discharge: 2019-06-02 | Disposition: A | Discharge status: Home / Self Care | Payer: Medicare Other | Source: Ambulatory Visit | Attending: Radiation Oncology | Admitting: Radiation Oncology

## 2019-06-02 ENCOUNTER — Other Ambulatory Visit: Payer: Self-pay

## 2019-06-02 DIAGNOSIS — C44320 Squamous cell carcinoma of skin of unspecified parts of face: Secondary | ICD-10-CM | POA: Diagnosis not present

## 2019-06-03 ENCOUNTER — Other Ambulatory Visit: Payer: Self-pay

## 2019-06-03 ENCOUNTER — Ambulatory Visit
Admission: RE | Admit: 2019-06-03 | Discharge: 2019-06-03 | Disposition: A | Discharge status: Home / Self Care | Payer: Medicare Other | Source: Ambulatory Visit | Attending: Radiation Oncology | Admitting: Radiation Oncology

## 2019-06-03 DIAGNOSIS — C44320 Squamous cell carcinoma of skin of unspecified parts of face: Secondary | ICD-10-CM | POA: Diagnosis not present

## 2019-06-04 ENCOUNTER — Ambulatory Visit
Admission: RE | Admit: 2019-06-04 | Discharge: 2019-06-04 | Disposition: A | Discharge status: Home / Self Care | Payer: Medicare Other | Source: Ambulatory Visit | Attending: Radiation Oncology | Admitting: Radiation Oncology

## 2019-06-04 ENCOUNTER — Other Ambulatory Visit: Payer: Self-pay

## 2019-06-04 DIAGNOSIS — C44320 Squamous cell carcinoma of skin of unspecified parts of face: Secondary | ICD-10-CM | POA: Diagnosis not present

## 2019-06-07 ENCOUNTER — Ambulatory Visit
Admission: RE | Admit: 2019-06-07 | Discharge: 2019-06-07 | Disposition: A | Discharge status: Home / Self Care | Payer: Medicare Other | Source: Ambulatory Visit | Attending: Radiation Oncology | Admitting: Radiation Oncology

## 2019-06-07 ENCOUNTER — Other Ambulatory Visit: Payer: Self-pay

## 2019-06-07 DIAGNOSIS — C44320 Squamous cell carcinoma of skin of unspecified parts of face: Secondary | ICD-10-CM | POA: Diagnosis not present

## 2019-06-08 ENCOUNTER — Ambulatory Visit
Admission: RE | Admit: 2019-06-08 | Discharge: 2019-06-08 | Disposition: A | Discharge status: Home / Self Care | Payer: Medicare Other | Source: Ambulatory Visit | Attending: Radiation Oncology | Admitting: Radiation Oncology

## 2019-06-08 ENCOUNTER — Other Ambulatory Visit: Payer: Self-pay

## 2019-06-08 DIAGNOSIS — C44320 Squamous cell carcinoma of skin of unspecified parts of face: Secondary | ICD-10-CM | POA: Diagnosis not present

## 2019-06-09 ENCOUNTER — Ambulatory Visit
Admission: RE | Admit: 2019-06-09 | Discharge: 2019-06-09 | Disposition: A | Discharge status: Home / Self Care | Payer: Medicare Other | Source: Ambulatory Visit | Attending: Radiation Oncology | Admitting: Radiation Oncology

## 2019-06-09 ENCOUNTER — Other Ambulatory Visit: Payer: Self-pay

## 2019-06-09 DIAGNOSIS — C44320 Squamous cell carcinoma of skin of unspecified parts of face: Secondary | ICD-10-CM | POA: Diagnosis not present

## 2019-06-10 ENCOUNTER — Ambulatory Visit
Admission: RE | Admit: 2019-06-10 | Discharge: 2019-06-10 | Disposition: A | Discharge status: Home / Self Care | Payer: Medicare Other | Source: Ambulatory Visit | Attending: Radiation Oncology | Admitting: Radiation Oncology

## 2019-06-10 ENCOUNTER — Other Ambulatory Visit: Payer: Self-pay

## 2019-06-10 DIAGNOSIS — C44320 Squamous cell carcinoma of skin of unspecified parts of face: Secondary | ICD-10-CM | POA: Diagnosis not present

## 2019-06-11 ENCOUNTER — Other Ambulatory Visit: Payer: Self-pay

## 2019-06-11 ENCOUNTER — Ambulatory Visit
Admission: RE | Admit: 2019-06-11 | Discharge: 2019-06-11 | Disposition: A | Discharge status: Home / Self Care | Payer: Medicare Other | Source: Ambulatory Visit | Attending: Radiation Oncology | Admitting: Radiation Oncology

## 2019-06-11 DIAGNOSIS — C44320 Squamous cell carcinoma of skin of unspecified parts of face: Secondary | ICD-10-CM | POA: Diagnosis not present

## 2019-06-14 ENCOUNTER — Other Ambulatory Visit: Payer: Self-pay

## 2019-06-14 ENCOUNTER — Ambulatory Visit
Admission: RE | Admit: 2019-06-14 | Discharge: 2019-06-14 | Disposition: A | Discharge status: Home / Self Care | Payer: Medicare Other | Source: Ambulatory Visit | Attending: Radiation Oncology | Admitting: Radiation Oncology

## 2019-06-14 DIAGNOSIS — C44320 Squamous cell carcinoma of skin of unspecified parts of face: Secondary | ICD-10-CM | POA: Insufficient documentation

## 2019-06-15 ENCOUNTER — Ambulatory Visit
Admission: RE | Admit: 2019-06-15 | Discharge: 2019-06-15 | Disposition: A | Discharge status: Home / Self Care | Payer: Medicare Other | Source: Ambulatory Visit | Attending: Radiation Oncology | Admitting: Radiation Oncology

## 2019-06-15 ENCOUNTER — Other Ambulatory Visit: Payer: Self-pay

## 2019-06-15 DIAGNOSIS — C44320 Squamous cell carcinoma of skin of unspecified parts of face: Secondary | ICD-10-CM | POA: Diagnosis not present

## 2019-06-16 ENCOUNTER — Other Ambulatory Visit: Payer: Self-pay

## 2019-06-16 ENCOUNTER — Ambulatory Visit
Admission: RE | Admit: 2019-06-16 | Discharge: 2019-06-16 | Disposition: A | Discharge status: Home / Self Care | Payer: Medicare Other | Source: Ambulatory Visit | Attending: Radiation Oncology | Admitting: Radiation Oncology

## 2019-06-16 DIAGNOSIS — C44320 Squamous cell carcinoma of skin of unspecified parts of face: Secondary | ICD-10-CM | POA: Diagnosis not present

## 2019-06-17 ENCOUNTER — Other Ambulatory Visit: Payer: Self-pay

## 2019-06-17 ENCOUNTER — Ambulatory Visit
Admission: RE | Admit: 2019-06-17 | Discharge: 2019-06-17 | Disposition: A | Discharge status: Home / Self Care | Payer: Medicare Other | Source: Ambulatory Visit | Attending: Radiation Oncology | Admitting: Radiation Oncology

## 2019-06-17 DIAGNOSIS — C44320 Squamous cell carcinoma of skin of unspecified parts of face: Secondary | ICD-10-CM | POA: Diagnosis not present

## 2019-06-18 ENCOUNTER — Other Ambulatory Visit: Payer: Self-pay

## 2019-06-18 ENCOUNTER — Ambulatory Visit
Admission: RE | Admit: 2019-06-18 | Discharge: 2019-06-18 | Disposition: A | Discharge status: Home / Self Care | Payer: Medicare Other | Source: Ambulatory Visit | Attending: Radiation Oncology | Admitting: Radiation Oncology

## 2019-06-18 DIAGNOSIS — C44320 Squamous cell carcinoma of skin of unspecified parts of face: Secondary | ICD-10-CM | POA: Diagnosis not present

## 2019-06-21 ENCOUNTER — Ambulatory Visit
Admission: RE | Admit: 2019-06-21 | Discharge: 2019-06-21 | Disposition: A | Discharge status: Home / Self Care | Payer: Medicare Other | Source: Ambulatory Visit | Attending: Radiation Oncology | Admitting: Radiation Oncology

## 2019-06-21 ENCOUNTER — Other Ambulatory Visit: Payer: Self-pay

## 2019-06-21 DIAGNOSIS — C44320 Squamous cell carcinoma of skin of unspecified parts of face: Secondary | ICD-10-CM | POA: Diagnosis not present

## 2019-06-22 ENCOUNTER — Other Ambulatory Visit: Payer: Self-pay

## 2019-06-22 ENCOUNTER — Ambulatory Visit
Admission: RE | Admit: 2019-06-22 | Discharge: 2019-06-22 | Disposition: A | Discharge status: Home / Self Care | Payer: Medicare Other | Source: Ambulatory Visit | Attending: Radiation Oncology | Admitting: Radiation Oncology

## 2019-06-22 DIAGNOSIS — C44320 Squamous cell carcinoma of skin of unspecified parts of face: Secondary | ICD-10-CM | POA: Diagnosis not present

## 2019-06-23 ENCOUNTER — Ambulatory Visit
Admission: RE | Admit: 2019-06-23 | Discharge: 2019-06-23 | Disposition: A | Discharge status: Home / Self Care | Payer: Medicare Other | Source: Ambulatory Visit | Attending: Radiation Oncology | Admitting: Radiation Oncology

## 2019-06-23 ENCOUNTER — Other Ambulatory Visit: Payer: Self-pay

## 2019-06-23 DIAGNOSIS — C44320 Squamous cell carcinoma of skin of unspecified parts of face: Secondary | ICD-10-CM | POA: Diagnosis not present

## 2019-06-24 ENCOUNTER — Ambulatory Visit
Admission: RE | Admit: 2019-06-24 | Discharge: 2019-06-24 | Disposition: A | Discharge status: Home / Self Care | Payer: Medicare Other | Source: Ambulatory Visit | Attending: Radiation Oncology | Admitting: Radiation Oncology

## 2019-06-24 ENCOUNTER — Other Ambulatory Visit: Payer: Self-pay

## 2019-06-24 DIAGNOSIS — C44320 Squamous cell carcinoma of skin of unspecified parts of face: Secondary | ICD-10-CM | POA: Diagnosis not present

## 2019-06-25 ENCOUNTER — Ambulatory Visit
Admission: RE | Admit: 2019-06-25 | Discharge: 2019-06-25 | Disposition: A | Discharge status: Home / Self Care | Payer: Medicare Other | Source: Ambulatory Visit | Attending: Radiation Oncology | Admitting: Radiation Oncology

## 2019-06-25 ENCOUNTER — Telehealth: Payer: Self-pay | Admitting: *Deleted

## 2019-06-25 ENCOUNTER — Other Ambulatory Visit: Payer: Self-pay

## 2019-06-25 DIAGNOSIS — C44320 Squamous cell carcinoma of skin of unspecified parts of face: Secondary | ICD-10-CM | POA: Diagnosis not present

## 2019-06-25 NOTE — Telephone Encounter (Signed)
CALLED PATIENT TO INFORM THAT HIS FU WITH ASHLYN Badger WILL BE IN-PERSON ON 07-28-19 @ 3 PM, SPOKE WITH PATIENT AND HE VERIFIED UNDERSTANDING THIS

## 2019-06-28 ENCOUNTER — Encounter: Payer: Self-pay | Admitting: Radiation Oncology

## 2019-06-28 ENCOUNTER — Other Ambulatory Visit: Payer: Self-pay

## 2019-06-28 ENCOUNTER — Ambulatory Visit
Admission: RE | Admit: 2019-06-28 | Discharge: 2019-06-28 | Disposition: A | Discharge status: Home / Self Care | Payer: Medicare Other | Source: Ambulatory Visit | Attending: Radiation Oncology | Admitting: Radiation Oncology

## 2019-06-28 DIAGNOSIS — C44320 Squamous cell carcinoma of skin of unspecified parts of face: Secondary | ICD-10-CM | POA: Diagnosis not present

## 2019-07-28 ENCOUNTER — Ambulatory Visit: Payer: Medicare Other | Admitting: Radiation Oncology

## 2019-07-28 ENCOUNTER — Telehealth: Payer: Self-pay | Admitting: *Deleted

## 2019-07-28 NOTE — Telephone Encounter (Signed)
CALLED PATIENT TO INFORM THAT FU APPT. HAS BEEN MOVED TO 10 AM ON 07/30/19, LVM FOR A RETURN CALL

## 2019-07-28 NOTE — Telephone Encounter (Signed)
Called patient to inform that fu for 07-28-19 has been moved to 07-30-19 @ 4 pm per Dr. Tammi Klippel request, spoke with patient and he is aware of this appt. change and is good with this appt. change

## 2019-07-30 ENCOUNTER — Ambulatory Visit
Admission: RE | Admit: 2019-07-30 | Discharge: 2019-07-30 | Disposition: A | Discharge status: Home / Self Care | Payer: Medicare Other | Source: Ambulatory Visit | Attending: Radiation Oncology | Admitting: Radiation Oncology

## 2019-07-30 ENCOUNTER — Encounter: Payer: Self-pay | Admitting: Radiation Oncology

## 2019-07-30 ENCOUNTER — Other Ambulatory Visit: Payer: Self-pay

## 2019-07-30 VITALS — BP 137/74 | HR 73 | Temp 98.2°F | Resp 18 | Ht 67.0 in | Wt 145.6 lb

## 2019-07-30 DIAGNOSIS — C44320 Squamous cell carcinoma of skin of unspecified parts of face: Secondary | ICD-10-CM

## 2019-07-30 NOTE — Progress Notes (Signed)
  Radiation Oncology         [phone]) [phone] ________________________________  Name: Paul Vasquez MRN: [id number]  Date: 06/28/2019  DOB: 09-27-1945  End of Treatment Note  Diagnosis:   73 y.o. 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 He experienced hyperpigmentation and alopecia to the treatment field, as well as an area of broken skin toward the back of the treatment field. He was also noted to have right eyelid swelling and drooping. He reported mild fatigue, and denied headaches and pain.   Plan: The patient has completed radiation treatment. The patient will return to radiation oncology clinic for routine followup in one month. I advised him to call or return sooner if he has any questions or concerns related to his recovery or treatment. ________________________________  Sheral Apley. Tammi Klippel, M.D.   This document serves as a record of services personally performed by [name], MD. It was created on his behalf by Wilburn Mylar, a trained medical scribe. The creation of this record is based on the scribe's personal observations and the provider's statements to them. This document has been checked and approved by the attending provider.

## 2019-07-30 NOTE — Progress Notes (Signed)
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 Squamous cell carcinoma of face  C44.320     Interval Since Last Radiation:  1 month 05/18/2019 - 06/28/2019: Right Temple / 60 Gy in 30 fractions  Narrative:  The patient returns today for routine follow-up.  He tolerated his treatment well. He denied pain. He did develop hyperpigmentation and alopecia noted in the treatment field with a 1 cm area of broken skin toward the back of the treatment field managed with neosporin and sonafine. He denied headaches or visual changes. The lid of the right eye was noted to be swollen and drooping.He did have mild fatigue but reported that it did not impair daily activities.  On review of systems, the patient reports that he is doing well overall. He has continued using the Sonafine cream daily and continues with redness and itching in the treatment field but feels this is gradually improving.  He has not had any open skin wounds or drainage/bleeding. In general, he feels well and is pleased with his progress to date.  ALLERGIES:  is allergic to amoxicillin and sulfamethoxazole.  Meds: Current Outpatient Medications  Medication Sig Dispense Refill  . calcium-vitamin D (OSCAL WITH D) 500-200 MG-UNIT tablet Take 1 tablet by mouth.    . NON FORMULARY Raw Calcium daily    . NON FORMULARY Immune stimulator daily    . NON FORMULARY Skeletal strength daily    . NON FORMULARY Growth factor daily    . NON FORMULARY Wheat grass daily    . Probiotic Product (SOLUBLE FIBER/PROBIOTICS PO) Take by mouth.    . vitamin C (ASCORBIC ACID) 500 MG tablet Take 500 mg by mouth 2 (two) times daily.     No current  facility-administered medications for this encounter.    Physical Findings: The patient is in no acute distress. Patient is alert and oriented.  height is 5\' 7"  (1.702 m) and weight is 145 lb 9.6 oz (66 kg). His temperature is 98.2 F (36.8 C). His blood pressure is 137/74 and his pulse is 73. His respiration is 18 and oxygen saturation is 99%. .  In general this is a well appearing caucasian male in no acute distress. He's alert and oriented x4 and appropriate throughout the examination. Cardiopulmonary assessment is negative for acute distress and he exhibits normal effort.  He continues with erythema in the treatment field with only very mild, dry desquamation at the outer edges but overall, no significant changes. Appears to be healing well.  Continues with ptosis of the right eyelid and mild swelling.  Lab Findings: Lab Results  Component Value Date   WBC 3.6 (L) 10/18/2010   HGB 13.7 10/18/2010   HCT 40.4 10/18/2010   PLT 210 10/18/2010    Lab Results  Component Value Date   NA 138 10/18/2010   K 4.0 10/18/2010   CO2 27 10/18/2010   GLUCOSE 123 (H) 10/18/2010   BUN 19 10/18/2010   CREATININE 0.85 10/18/2010   CALCIUM 8.8 10/18/2010    Radiographic Findings: No results found.  Impression:  The patient is recovering from the effects of radiation.    Plan: We discussed increasing  the use of this on a fine cream to the treatment field twice daily and he was given another tube today in the office.  He will continue to keep this area protected from the sun.  We discussed scheduling a follow-up with dermatology to establish care and follow-up going forward.  He is interested in seeing Dr. Wilhemina Bonito in [place] and will call for an appointment.  He is scheduled for a follow-[facility] so we will plan to see him back for follow-up in February 2021.  He is comfortable and in agreement with the stated  plan.  _____________________________________  Sheral Apley. Tammi Klippel, M.D.   This document serves as a record of services personally performed by [name], MD. It was created on his behalf by Wilburn Mylar, a trained medical scribe. The creation of this record is based on the scribe's personal observations and the provider's statements to them. This document has been checked and approved by the attending provider.

## 2019-07-30 NOTE — Progress Notes (Signed)
Received patient in the clinic today for one month follow up s/p radiation to skin of right temple. Weight and vitals stable. Patient denies pain. Hyperpigmentation without desquamation of treatment field noted. Reports he continues to use Sonafine but "probably not as often as I should." Reinforced need of sonafine and he verbalized understanding. Minimal alopecia noted at treatment site. Patient denies headache, dizziness, nausea, vomiting, or tinnitus. Patient scheduled to follow up at the [facility] in [place] the third week of January to have his drooping right eyelid tacked up. Edema noted outter crease of right eye. Patient denies establishing a primary dermatologist to manage him moving forward. With patient's permission image below was obtained.

## 2019-09-01 ENCOUNTER — Ambulatory Visit: Payer: Medicare PPO | Attending: Internal Medicine

## 2019-09-01 DIAGNOSIS — Z23 Encounter for immunization: Secondary | ICD-10-CM

## 2019-09-01 NOTE — Progress Notes (Signed)
   Covid-19 Vaccination Clinic  Name:  Paul Vasquez    MRN: [id number] DOB: 09-24-1945  09/01/2019  [name] was observed post Covid-19 immunization for 15 minutes without incidence. He was provided with Vaccine Information Sheet and instruction to access the V-Safe system.   [name] was instructed to call 911 with any severe reactions post vaccine: Marland Kitchen Difficulty breathing  . Swelling of your face and throat  . A fast heartbeat  . A bad rash all over your body  . Dizziness and weakness    Immunizations Administered    Name Date Dose VIS Date Route   Pfizer COVID-19 Vaccine 09/01/2019 12:01 PM 0.3 mL 07/23/2019 Intramuscular   Manufacturer: [employer]   Lot: [id number]   [place]: [id number]

## 2019-09-07 ENCOUNTER — Encounter: Payer: Self-pay | Admitting: Urology

## 2019-09-20 ENCOUNTER — Ambulatory Visit: Payer: Medicare PPO | Attending: Internal Medicine

## 2019-09-20 DIAGNOSIS — Z23 Encounter for immunization: Secondary | ICD-10-CM

## 2019-09-20 NOTE — Progress Notes (Signed)
   Covid-19 Vaccination Clinic  Name:  Paul Vasquez    MRN: [id number] DOB: 1946-02-01  09/20/2019  Paul Vasquez was observed post Covid-19 immunization for 15 minutes without incidence. He was provided with Vaccine Information Sheet and instruction to access the V-Safe system.   Paul Vasquez was instructed to call 911 with any severe reactions post vaccine: Marland Kitchen Difficulty breathing  . Swelling of your face and throat  . A fast heartbeat  . A bad rash all over your body  . Dizziness and weakness    Immunizations Administered    Name Date Dose VIS Date Route   Pfizer COVID-19 Vaccine 09/20/2019  8:58 AM 0.3 mL 07/23/2019 Intramuscular   Manufacturer: [employer]   Lot: [id number]   [place]: [id number]

## 2019-09-24 ENCOUNTER — Telehealth: Payer: Self-pay | Admitting: *Deleted

## 2019-09-24 NOTE — Telephone Encounter (Signed)
Called patient to inform that fu appt. on 09-28-19 has been moved to 10-01-19 @ 9 am, spoke with patient and he is aware of this appt. change and is good with it.

## 2019-09-28 ENCOUNTER — Ambulatory Visit: Payer: Medicare Other

## 2019-09-28 ENCOUNTER — Ambulatory Visit: Payer: Self-pay | Admitting: Urology

## 2019-10-01 ENCOUNTER — Ambulatory Visit: Payer: Medicare PPO | Admitting: Urology

## 2019-10-08 ENCOUNTER — Other Ambulatory Visit: Payer: Self-pay

## 2019-10-08 ENCOUNTER — Ambulatory Visit
Admission: RE | Admit: 2019-10-08 | Discharge: 2019-10-08 | Disposition: A | Discharge status: Home / Self Care | Payer: Medicare PPO | Source: Ambulatory Visit | Attending: Urology | Admitting: Urology

## 2019-10-08 ENCOUNTER — Encounter: Payer: Self-pay | Admitting: Urology

## 2019-10-08 ENCOUNTER — Encounter: Payer: Self-pay | Admitting: *Deleted

## 2019-10-08 VITALS — BP 135/67 | HR 73 | Temp 98.5°F | Resp 20 | Wt 146.2 lb

## 2019-10-08 DIAGNOSIS — C44320 Squamous cell carcinoma of skin of unspecified parts of face: Secondary | ICD-10-CM | POA: Diagnosis not present

## 2019-10-08 DIAGNOSIS — Z923 Personal history of irradiation: Secondary | ICD-10-CM | POA: Insufficient documentation

## 2019-10-08 NOTE — Progress Notes (Signed)
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 Squamous cell carcinoma of face  C44.320     Interval Since Last Radiation:  3 months 05/18/2019 - 06/28/2019: Right Temple / 60 Gy in 30 fractions  Narrative:  The patient returns today for routine follow-up.  He tolerated his treatment well. He denied pain. He did develop hyperpigmentation and alopecia noted in the treatment field with a 1 cm area of broken skin toward the back of the treatment field managed with neosporin and sonafine. He denied headaches or visual changes. The lid of the right eye was noted to be swollen and drooping and he did experience mild fatigue but reported that it did not impair daily activities. Since we last saw him in 07/2019,  he has had a follow up visit with Dr. 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 He has recovered well from this procedure and is pleased with the results.  He has also established care with Dr. Wilhemina Bonito at [facility] dermatology and was started on Efudex for local treatment of precancerous lesions on his scalp and forehead. He has a follow up visit with Dr. Ronnald Ramp on 11/12/19.  On review of systems, the patient reports that he is doing well overall. He has discontinued use of the Sonafine cream and reports continued gradual improvement in the redness and itching in the treatment field. He is currently using Vitamin E oil in the treatment field and massaging this along the incision.   He has not had any open skin wounds or  drainage/bleeding. 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  In general, he feels well and is pleased with his progress to date.   ALLERGIES:  is allergic to amoxicillin and sulfamethoxazole.  Meds: Current Outpatient Medications  Medication Sig Dispense Refill  . calcium-vitamin D (OSCAL WITH D) 500-200 MG-UNIT tablet Take 1 tablet by mouth.    . NON FORMULARY Raw Calcium daily    . NON FORMULARY Immune stimulator daily    . NON FORMULARY Skeletal strength daily    . NON FORMULARY Growth factor daily    . NON FORMULARY Wheat grass daily    . Probiotic Product (SOLUBLE FIBER/PROBIOTICS PO) Take by mouth.    . vitamin C (ASCORBIC ACID) 500 MG tablet Take 500 mg by mouth 2 (two) times daily.     No current facility-administered medications for this encounter.    Physical Findings:  weight is 146 lb 3.2 oz (66.3 kg). His temperature is 98.5 F (36.9 C). His blood pressure is 135/67 and his pulse is 73. His respiration is 20 and oxygen saturation is 100%. .  In general this is a well appearing caucasian male in no acute distress. He's alert and oriented x4 and appropriate throughout the examination. Cardiopulmonary assessment is negative for acute distress and he exhibits normal  effort.  He continues with mild erythema in the treatment field which is improved from prior exam. Appears to be healing well.  He is s/p right brow pexy with Dr. Levada Dy on 09/06/19 for correction of the ptosis of the right eyelid and the small incision appears well healed and ptosis much improved. There is no palpable nodularity along his incision and throughout the treatment field and no tenderness to palpation. No cervical lymphadenopathy.   Lab Findings: Lab Results  Component Value Date   WBC 3.6 (L) 10/18/2010   HGB 13.7 10/18/2010   HCT 40.4 10/18/2010   PLT 210  10/18/2010    Lab Results  Component Value Date   NA 138 10/18/2010   K 4.0 10/18/2010   CO2 27 10/18/2010   GLUCOSE 123 (H) 10/18/2010   BUN 19 10/18/2010   CREATININE 0.85 10/18/2010   CALCIUM 8.8 10/18/2010    Radiographic Findings: No results found.  Impression:  The patient is recovering from the effects of radiation.    Plan: He appears to be recovering well from the effects of his recent radiotherapy and recent brow pexy procedure.  We discussed the recommendation to see him back in 6 months for physical exam. He will continue in routine follow up under the care and direction of Dr. Wilhemina Bonito at [facility] dermatology for continued close observation and skin survey. He is comfortable and in agreement with the stated plan.   [name], PA-C    [name], MD  [facility] Direct Dial: [phone]  Fax: [phone] [email or web address]  Skype  LinkedIn

## 2019-10-11 ENCOUNTER — Telehealth: Payer: Self-pay | Admitting: *Deleted

## 2019-10-11 NOTE — Telephone Encounter (Signed)
CALLED PATIENT TO INFORM OF FU APPT. ON 04/07/20 @ 10 AM WITH ASHLYN BRUNING, SPOKE WITH PATIENT AND HE IS AWARE OF THIS APPT.

## 2019-10-20 ENCOUNTER — Telehealth: Payer: Self-pay | Admitting: Radiation Oncology

## 2019-10-20 NOTE — Telephone Encounter (Signed)
During follow up appointment patient revealed understandable grief reference the lose of his wife. I emailed his today to tell him about the book series [place] and the book OK NOT TO BE OK in the hope they might help.

## 2019-11-25 ENCOUNTER — Telehealth: Payer: Self-pay | Admitting: Radiation Oncology

## 2019-11-25 NOTE — Telephone Encounter (Signed)
Received voicemail message from patient concerned about continued right temple/treatment field tenderness. Also, patient concerned about pain in jaw joint on the right side when he opens his mouth very wide. Patient requesting a return call.   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 Johny Shears next follow up with this patient won't be until February 2021.

## 2019-11-25 NOTE — Telephone Encounter (Signed)
These symptoms should continue to gradually improve over the next 3-6 months but some of the tenderness in the treatment field may be secondary to some residual nerve damage/irritation from surgery. I'm not entirely sure about the pain in the jaw joint but suspect some of this may be associated with scar tissue formation causing tightness.  He should continue to monitor the symptoms and let us know if they are progressively worsening.  Otherwise, we can reassess at the time of his scheduled follow up. -Alawna Graybeal

## 2019-11-26 ENCOUNTER — Telehealth: Payer: Self-pay | Admitting: Radiation Oncology

## 2019-11-26 NOTE — Telephone Encounter (Signed)
Phoned patient back. Explained the following as requested by Freeman Caldron, PA-C:These symptoms should continue to gradually improve over the next 3-6 months but some of the tenderness in the treatment field may be secondary to some residual nerve damage/irritation from surgery. We aren't entirely sure about the pain in the jaw joint but suspect some of this may be associated with scar tissue formation causing tightness. Please continue to monitor the symptoms and let us know if they are progressively worsening.  Otherwise, we can reassess at the time of his scheduled follow up in August. Patient verbalized understanding. Provided patient with my direct number for future needs.

## 2019-12-02 ENCOUNTER — Other Ambulatory Visit: Payer: Self-pay

## 2019-12-02 ENCOUNTER — Ambulatory Visit
Admission: RE | Admit: 2019-12-02 | Discharge: 2019-12-02 | Disposition: A | Discharge status: Home / Self Care | Payer: Medicare PPO | Source: Ambulatory Visit | Attending: Family Medicine | Admitting: Family Medicine

## 2019-12-02 ENCOUNTER — Other Ambulatory Visit: Payer: Self-pay | Admitting: Family Medicine

## 2019-12-02 DIAGNOSIS — M25471 Effusion, right ankle: Secondary | ICD-10-CM

## 2019-12-02 IMAGING — CR DG ANKLE COMPLETE 3+V*R*
3 series · 3 of 3 positions shown · non-contrast
Comparison: None.

CLINICAL DATA: Right foot and ankle pain and swelling for 1 week.
No known injury.

EXAM:
RIGHT ANKLE - COMPLETE 3+ VIEW

[x ankle ap right]
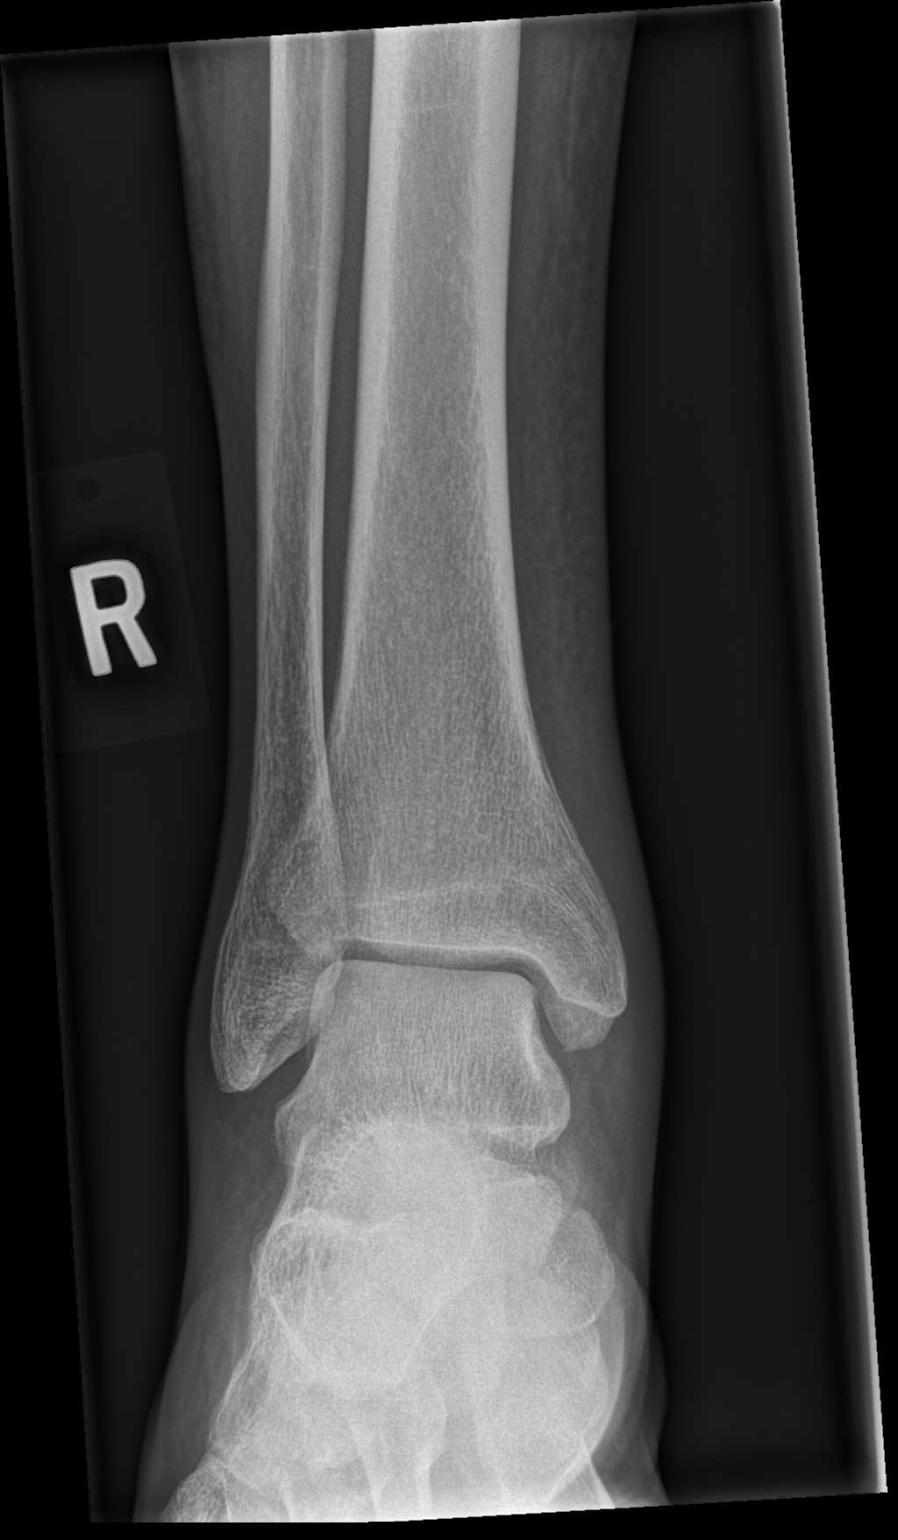

[x ankle obl right]
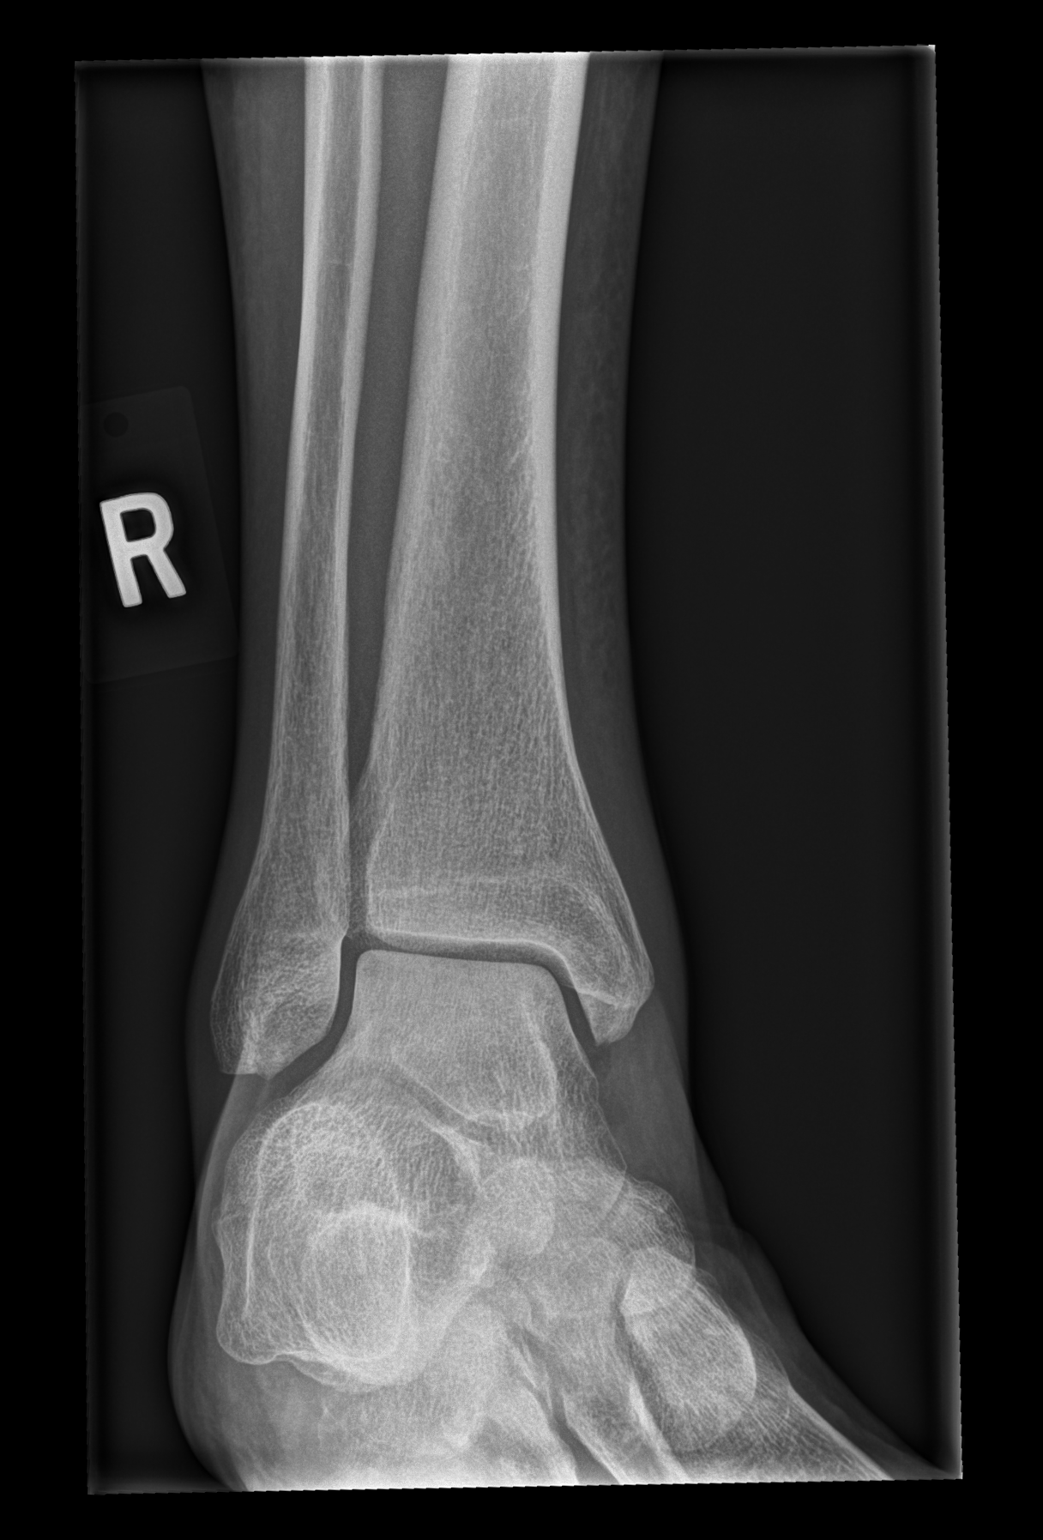

[x ankle lat right]
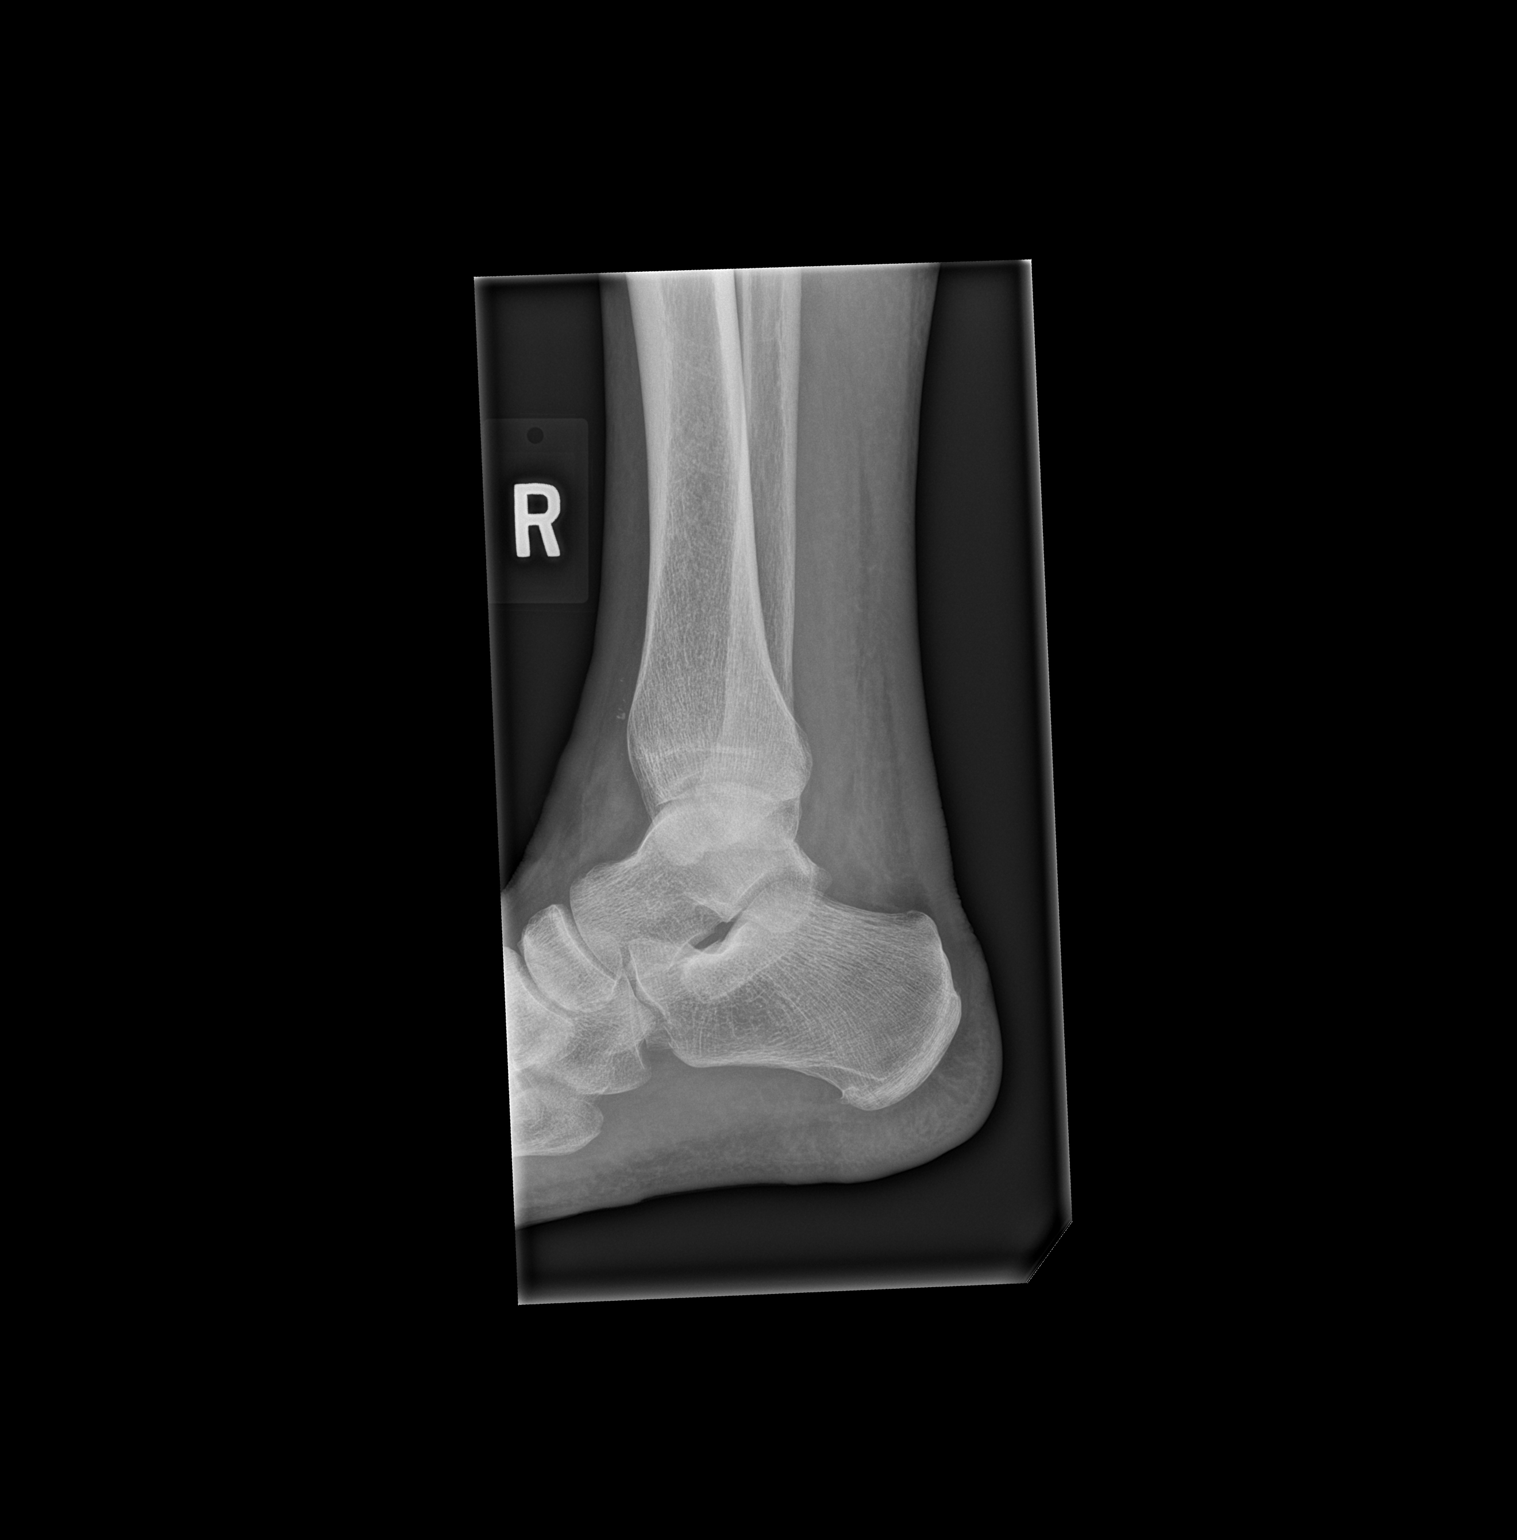

[3 of 3 positions shown; findings below may reference images not displayed]

FINDINGS: There is no evidence of fracture, dislocation, or joint effusion.
There is no evidence of arthropathy or other focal bone abnormality.
Mild soft tissue swelling is seen about the medial aspect of the
ankle.
IMPRESSION: Mild medial soft tissue swelling.  Otherwise negative.

## 2019-12-03 ENCOUNTER — Ambulatory Visit (INDEPENDENT_AMBULATORY_CARE_PROVIDER_SITE_OTHER): Payer: Medicare PPO

## 2019-12-03 ENCOUNTER — Ambulatory Visit: Payer: Medicare PPO | Admitting: Podiatry

## 2019-12-03 ENCOUNTER — Encounter: Payer: Self-pay | Admitting: Podiatry

## 2019-12-03 VITALS — Temp 97.3°F

## 2019-12-03 DIAGNOSIS — M778 Other enthesopathies, not elsewhere classified: Secondary | ICD-10-CM | POA: Diagnosis not present

## 2019-12-03 DIAGNOSIS — M779 Enthesopathy, unspecified: Secondary | ICD-10-CM

## 2019-12-03 DIAGNOSIS — M79671 Pain in right foot: Secondary | ICD-10-CM

## 2019-12-03 DIAGNOSIS — R600 Localized edema: Secondary | ICD-10-CM | POA: Diagnosis not present

## 2019-12-06 NOTE — Progress Notes (Signed)
Subjective:   Patient ID: Paul Vasquez, male   DOB: 74 y.o.   MRN: [id number]   HPI Patient presents with quite a bit of discomfort in the right ankle on the medial side and states that he does not remember specific injury but it is been bothersome and has had quite a bit of swelling associated with it.  Patient has had no other health problems recently associated with this and patient does not currently smoke and would like to be active   Review of Systems  All other systems reviewed and are negative.       Objective:  Physical Exam Vitals and nursing note reviewed.  Constitutional:      Appearance: He is well-developed.  Pulmonary:     Effort: Pulmonary effort is normal.  Musculoskeletal:        General: Normal range of motion.  Skin:    General: Skin is warm.  Neurological:     Mental Status: He is alert.     Neurovascular status was found to be intact with muscle strength adequate.  I did note negative Bevelyn Buckles' sign and range of motion that is mildly diminished but I think it is more from splinting due to the pain he is experiencing with quite a bit of discomfort in the medial ankle along the posterior tibial as it gets close to its insertion navicular but I was unable to determine whether or not there could be a tear of the tendon as it does appear to be functioning normally.  Patient has +1 +2 pitting edema in the midfoot and into the ankle but no edema into the lower leg     Assessment:  Probability for an inflammatory condition with fluid buildup and tendinitis     Plan:  8 NP reviewed condition and at this point I have recommended that this patient have a careful injection with sterile prep applied and injected the posterior tibial tendon 3 mg Dexasone Kenalog 5 mg Xylocaine and due to the edema present in the midfoot and into the ankle I did apply Unna boot and gave strict instructions of any changes were to occur or any pathology he is to let us know  immediately.  Patient will be checked back in the next several weeks or earlier if needed  X-rays indicate there is some depression of the arch with mild arthritis but no acute pathology noted currently or indications of fracture

## 2019-12-16 ENCOUNTER — Ambulatory Visit: Payer: Medicare PPO | Admitting: Podiatry

## 2019-12-16 ENCOUNTER — Other Ambulatory Visit: Payer: Self-pay

## 2019-12-16 ENCOUNTER — Encounter: Payer: Self-pay | Admitting: Podiatry

## 2019-12-16 VITALS — Temp 97.1°F

## 2019-12-16 DIAGNOSIS — L600 Ingrowing nail: Secondary | ICD-10-CM | POA: Diagnosis not present

## 2019-12-16 DIAGNOSIS — R600 Localized edema: Secondary | ICD-10-CM | POA: Diagnosis not present

## 2019-12-16 DIAGNOSIS — M76821 Posterior tibial tendinitis, right leg: Secondary | ICD-10-CM | POA: Diagnosis not present

## 2019-12-16 MED ORDER — NEOMYCIN-POLYMYXIN-HC 3.5-10000-1 OT SOLN: OTIC | 0 refills | Status: DC

## 2019-12-16 NOTE — Patient Instructions (Signed)

## 2019-12-17 ENCOUNTER — Telehealth: Payer: Self-pay | Admitting: *Deleted

## 2019-12-17 DIAGNOSIS — R600 Localized edema: Secondary | ICD-10-CM

## 2019-12-17 NOTE — Telephone Encounter (Signed)
Dr. Paulla Dolly ordered right venous doppler possible DVT due to presents of localized edema. Faxed orders to [facility].

## 2019-12-19 NOTE — Progress Notes (Signed)
Subjective:   Patient ID: Paul Vasquez, male   DOB: 74 y.o.   MRN: [id number]   HPI Patient states his foot is feeling some better but he still gets some swelling in his midfoot and ankle and also states that he is having trouble with an ingrown toenail on his right big toe as it hits against the second toe.  States that the swelling is still bothersome but he has no other systemic signs of issues associated with it   ROS      Objective:  Physical Exam  Neurovascular status intact with patient found to have localized edema around the ankle with significant diminishment of discomfort in the posterior tibial region with negative Bevelyn Buckles' sign noted currently but edema still present with an incurvated right hallux lateral border that is painful     Assessment:  Possibility that we may be dealing with a low level DVT versus a localized process along with chronic ingrown toenail deformity right     Plan:  H&P reviewed all conditions and discussed possibility for DVT.  I did dispensed ankle compression stocking to help control swelling and gave him strict instructions for signs to look for.  At this point working to correct the ingrown toenail that is painful and I explained procedure and allowed him to sign consent form understanding risk.  I infiltrated the hallux 60 mg like Marcaine mixture sterile prep applied and using sterile instrumentation I removed the lateral border exposed matrix applied phenol 3 applications 30 seconds followed by alcohol lavage and sterile dressing.  Gave instructions on soaks and reappoint for [place] to recheck again and encouraged him to call if any changes were to occur or any signs of blood clot or lung issues were to occur

## 2019-12-21 ENCOUNTER — Ambulatory Visit (HOSPITAL_COMMUNITY)
Admission: RE | Admit: 2019-12-21 | Discharge: 2019-12-21 | Disposition: A | Discharge status: Home / Self Care | Payer: Medicare PPO | Source: Ambulatory Visit | Attending: Cardiology | Admitting: Cardiology

## 2019-12-21 ENCOUNTER — Other Ambulatory Visit: Payer: Self-pay

## 2019-12-21 DIAGNOSIS — R600 Localized edema: Secondary | ICD-10-CM

## 2019-12-23 ENCOUNTER — Other Ambulatory Visit: Payer: Self-pay | Admitting: Podiatry

## 2019-12-23 DIAGNOSIS — M779 Enthesopathy, unspecified: Secondary | ICD-10-CM

## 2019-12-28 ENCOUNTER — Telehealth: Payer: Self-pay | Admitting: Podiatry

## 2019-12-28 NOTE — Telephone Encounter (Signed)
Pt has questions regarding treatment and would like to talk to you. Please advise.

## 2019-12-28 NOTE — Telephone Encounter (Signed)
I spoke with pt and told him he should continue the soaking and drops until the scabs in the corners are gone because they are deeper pockets. I told pt not to sleep in the compression sock only wear it during the day, and could try going without if foot or ankle became painful or swollen then could go back in to it. Pt states understanding.

## 2019-12-28 NOTE — Telephone Encounter (Signed)
Pt returned my call states he is continuing to perform the once a day soaking and drops, wanted to know how much longer to continue, has scabby areas in the corners. Pt asked when he could come out of the compression sock, he had slept in it a couple of nights then went without during the day and only had a few areas of tenderness.

## 2019-12-29 ENCOUNTER — Emergency Department (HOSPITAL_COMMUNITY): Payer: Medicare PPO

## 2019-12-29 ENCOUNTER — Other Ambulatory Visit: Payer: Self-pay

## 2019-12-29 ENCOUNTER — Encounter (HOSPITAL_COMMUNITY): Payer: Self-pay | Admitting: Emergency Medicine

## 2019-12-29 ENCOUNTER — Inpatient Hospital Stay (HOSPITAL_COMMUNITY)
Admission: EM | Admit: 2019-12-29 | Discharge: 2019-12-31 | DRG: 066 | Disposition: A | Discharge status: Home Health | Payer: Medicare PPO | Attending: Internal Medicine | Admitting: Internal Medicine

## 2019-12-29 DIAGNOSIS — Z882 Allergy status to sulfonamides status: Secondary | ICD-10-CM

## 2019-12-29 DIAGNOSIS — G8311 Monoplegia of lower limb affecting right dominant side: Secondary | ICD-10-CM | POA: Diagnosis present

## 2019-12-29 DIAGNOSIS — H4920 Sixth [abducent] nerve palsy, unspecified eye: Secondary | ICD-10-CM | POA: Diagnosis present

## 2019-12-29 DIAGNOSIS — R29701 NIHSS score 1: Secondary | ICD-10-CM | POA: Diagnosis present

## 2019-12-29 DIAGNOSIS — I444 Left anterior fascicular block: Secondary | ICD-10-CM | POA: Diagnosis present

## 2019-12-29 DIAGNOSIS — Z8546 Personal history of malignant neoplasm of prostate: Secondary | ICD-10-CM

## 2019-12-29 DIAGNOSIS — Z923 Personal history of irradiation: Secondary | ICD-10-CM

## 2019-12-29 DIAGNOSIS — Z9079 Acquired absence of other genital organ(s): Secondary | ICD-10-CM

## 2019-12-29 DIAGNOSIS — Z825 Family history of asthma and other chronic lower respiratory diseases: Secondary | ICD-10-CM

## 2019-12-29 DIAGNOSIS — R29898 Other symptoms and signs involving the musculoskeletal system: Secondary | ICD-10-CM

## 2019-12-29 DIAGNOSIS — I10 Essential (primary) hypertension: Secondary | ICD-10-CM | POA: Diagnosis present

## 2019-12-29 DIAGNOSIS — I639 Cerebral infarction, unspecified: Secondary | ICD-10-CM | POA: Diagnosis present

## 2019-12-29 DIAGNOSIS — Z880 Allergy status to penicillin: Secondary | ICD-10-CM

## 2019-12-29 DIAGNOSIS — Z20822 Contact with and (suspected) exposure to covid-19: Secondary | ICD-10-CM | POA: Diagnosis present

## 2019-12-29 DIAGNOSIS — R200 Anesthesia of skin: Secondary | ICD-10-CM

## 2019-12-29 DIAGNOSIS — Z85820 Personal history of malignant melanoma of skin: Secondary | ICD-10-CM

## 2019-12-29 DIAGNOSIS — I63312 Cerebral infarction due to thrombosis of left middle cerebral artery: Secondary | ICD-10-CM

## 2019-12-29 DIAGNOSIS — L03031 Cellulitis of right toe: Secondary | ICD-10-CM | POA: Diagnosis present

## 2019-12-29 DIAGNOSIS — Z807 Family history of other malignant neoplasms of lymphoid, hematopoietic and related tissues: Secondary | ICD-10-CM

## 2019-12-29 DIAGNOSIS — E785 Hyperlipidemia, unspecified: Secondary | ICD-10-CM | POA: Diagnosis present

## 2019-12-29 DIAGNOSIS — E876 Hypokalemia: Secondary | ICD-10-CM | POA: Diagnosis present

## 2019-12-29 DIAGNOSIS — Z8249 Family history of ischemic heart disease and other diseases of the circulatory system: Secondary | ICD-10-CM

## 2019-12-29 DIAGNOSIS — Z85828 Personal history of other malignant neoplasm of skin: Secondary | ICD-10-CM

## 2019-12-29 LAB — URINALYSIS, ROUTINE W REFLEX MICROSCOPIC
Bilirubin Urine: NEGATIVE
Glucose, UA: NEGATIVE mg/dL
Hgb urine dipstick: NEGATIVE
Ketones, ur: 5 mg/dL — AB
Leukocytes,Ua: NEGATIVE
Nitrite: NEGATIVE
Protein, ur: NEGATIVE mg/dL
Specific Gravity, Urine: 1.017 (ref 1.005–1.030)
pH: 5 (ref 5.0–8.0)

## 2019-12-29 LAB — BASIC METABOLIC PANEL
Anion gap: 11 (ref 5–15)
BUN: 20 mg/dL (ref 8–23)
CO2: 27 mmol/L (ref 22–32)
Calcium: 9.2 mg/dL (ref 8.9–10.3)
Chloride: 102 mmol/L (ref 98–111)
Creatinine, Ser: 0.86 mg/dL (ref 0.61–1.24)
GFR calc Af Amer: >60 mL/min (ref >60)
GFR calc non Af Amer: >60 mL/min (ref >60)
Glucose, Bld: 143 mg/dL — ABNORMAL HIGH (ref 70–99)
Potassium: 4 mmol/L (ref 3.5–5.1)
Sodium: 140 mmol/L (ref 135–145)

## 2019-12-29 LAB — CBC
HCT: 43.8 % (ref 39.0–52.0)
Hemoglobin: 14.6 g/dL (ref 13.0–17.0)
MCH: 32 pg (ref 26.0–34.0)
MCHC: 33.3 g/dL (ref 30.0–36.0)
MCV: 96.1 fL (ref 80.0–100.0)
Platelets: 239 10*3/uL (ref 150–400)
RBC: 4.56 MIL/uL (ref 4.22–5.81)
RDW: 12.7 % (ref 11.5–15.5)
WBC: 5.3 10*3/uL (ref 4.0–10.5)
nRBC: 0 % (ref 0.0–0.2)

## 2019-12-29 LAB — CBG MONITORING, ED: Glucose-Capillary: 110 mg/dL — ABNORMAL HIGH (ref 70–99)

## 2019-12-29 IMAGING — CT CT HEAD W/O CM
3 series · 16 of 47 positions shown, 19 images · non-contrast
Comparison: None.

CLINICAL DATA: Right lower extremity numbness and weakness

EXAM:
CT HEAD WITHOUT CONTRAST
TECHNIQUE: Contiguous axial images were obtained from the base of the skull
through the vertex without intravenous contrast.

[Series 2: head wo · axial · 0.47mm/px · z∈[-90,+50]mm · 10 of 34 slices shown, 13 images]
[im 3/34  brain]
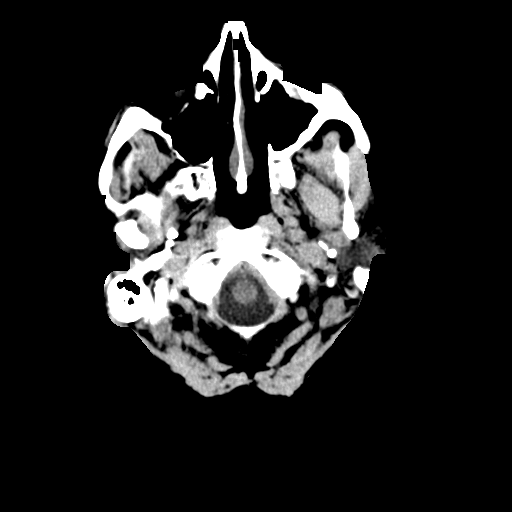
[im 3/34  bone]
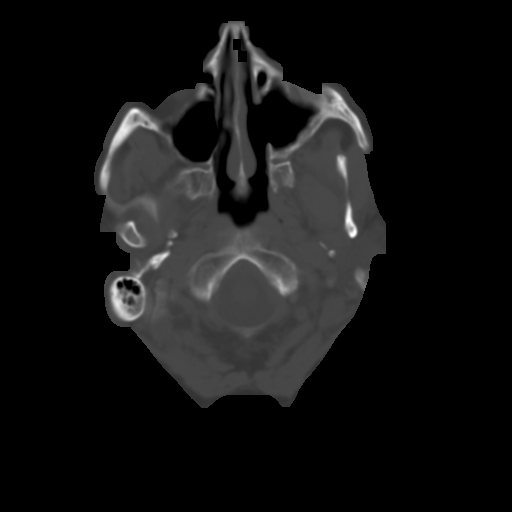
[im 6/34  brain]
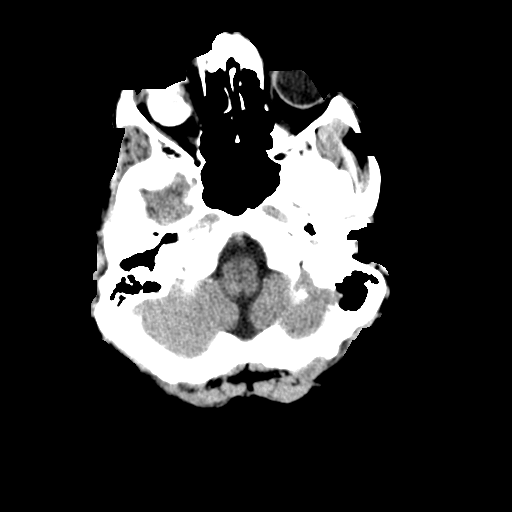
[im 10/34  brain]
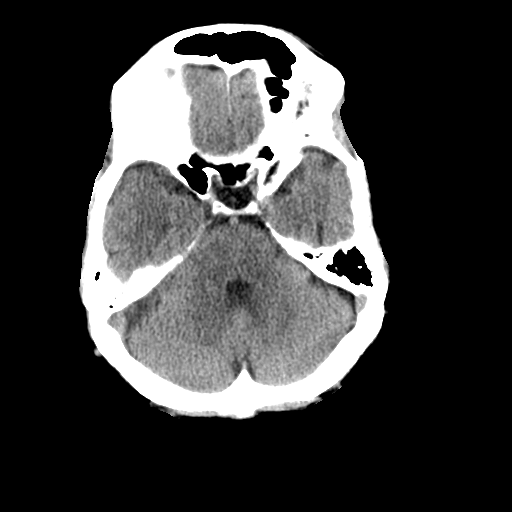
[im 12/34  brain]
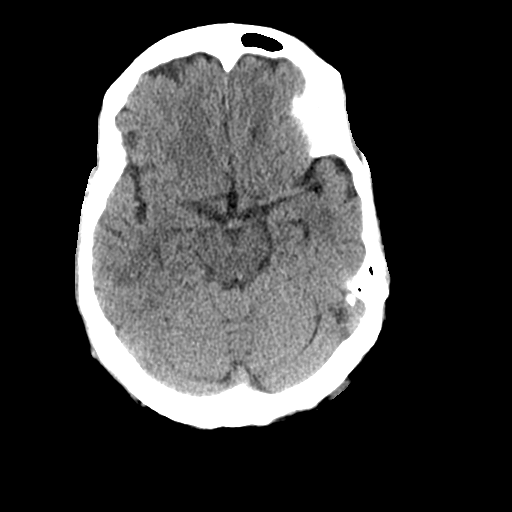
[im 15/34  brain]
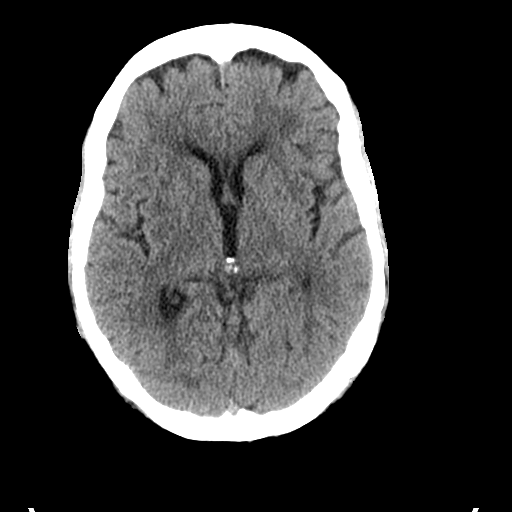
[im 15/34  bone]
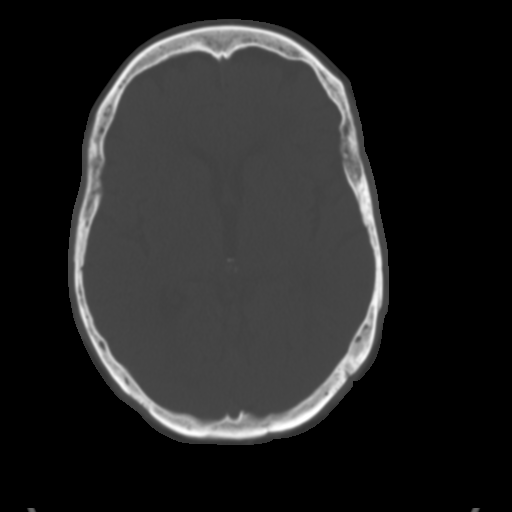
[im 19/34  brain]
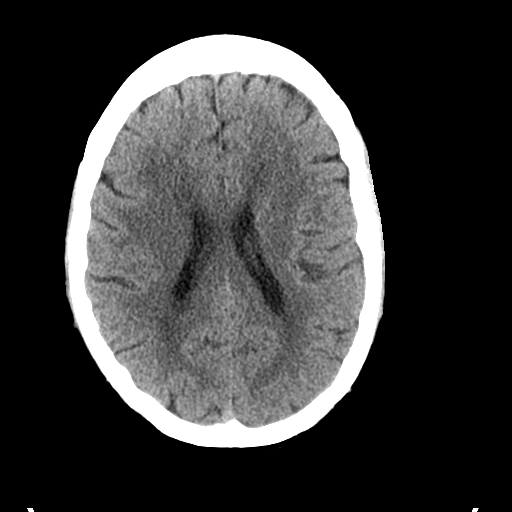
[im 22/34  brain]
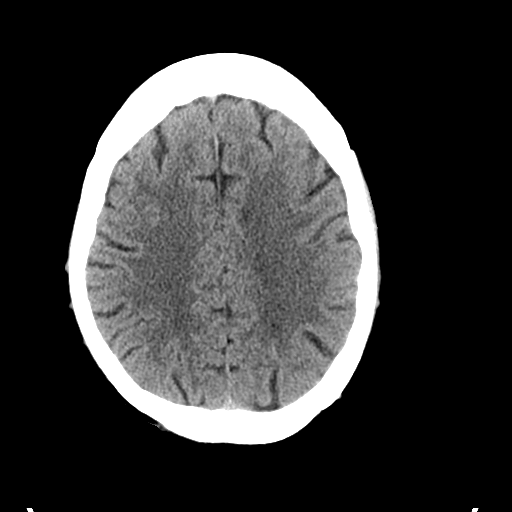
[im 26/34  brain]
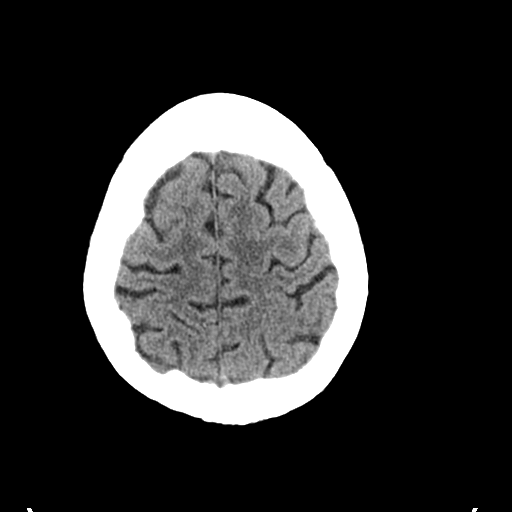
[im 28/34  brain]
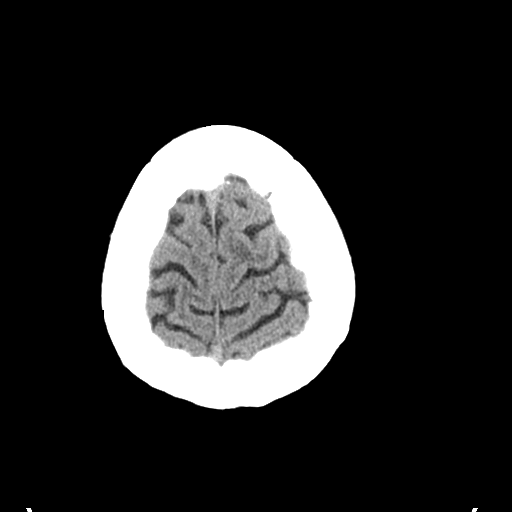
[im 28/34  bone]
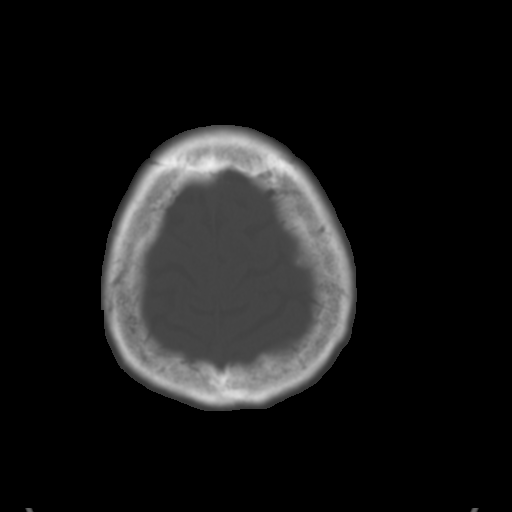
[im 31/34  brain]
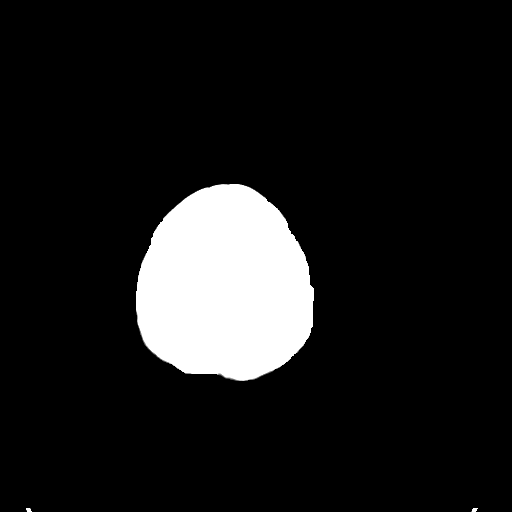

[Series 4: coronal soft tissue · coronal · 0.31mm/px · 3 of 68 slices shown]
[im 23/68  brain]
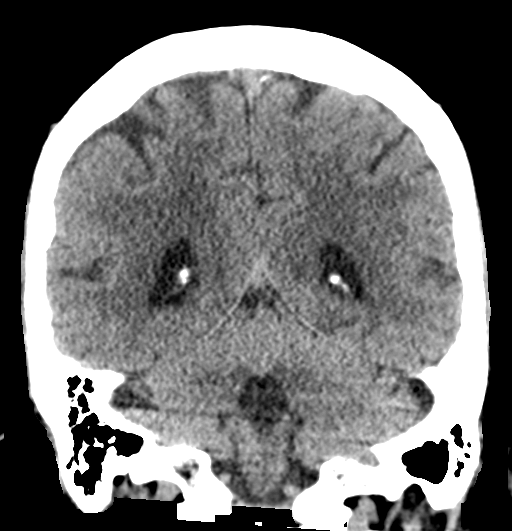
[im 30/68  brain]
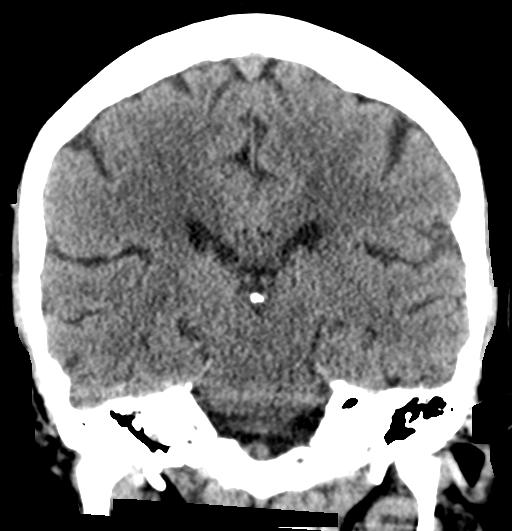
[im 38/68  brain]
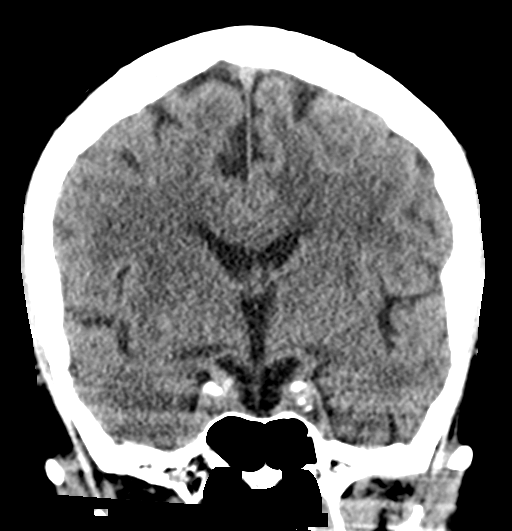

[Series 5: sagittal soft tissue · sagittal · 0.32mm/px · 3 of 53 slices shown]
[im 18/53  brain]
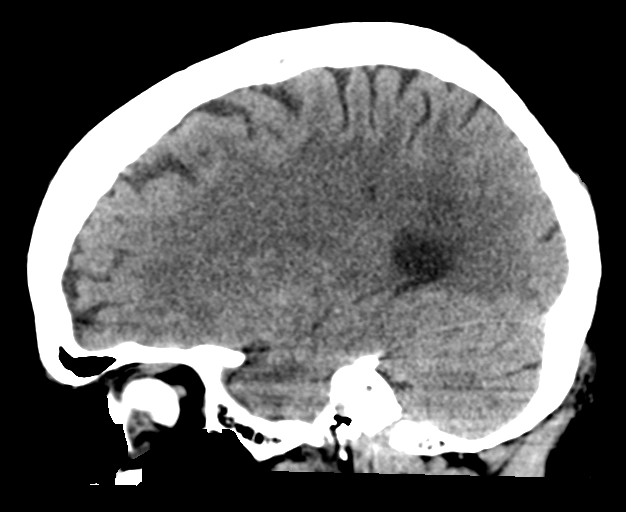
[im 27/53  brain]
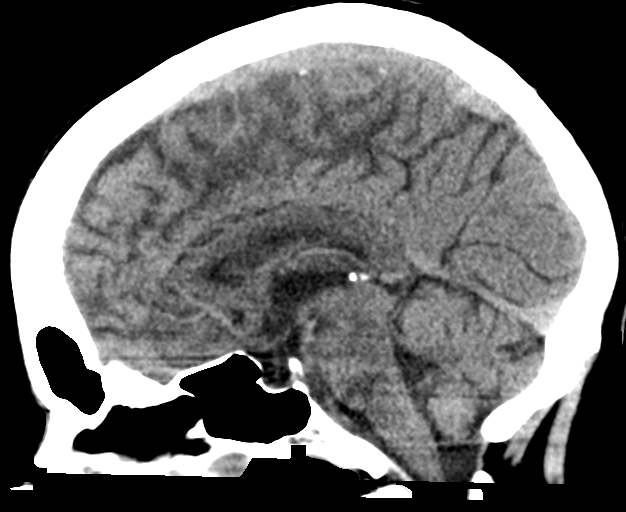
[im 35/53  brain]
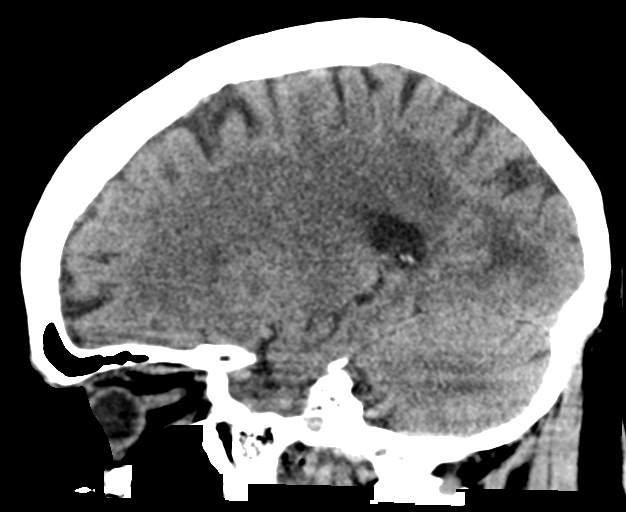

[16 of 47 positions shown; findings below may reference images not displayed]

FINDINGS: Brain: No acute territorial infarction, hemorrhage or intracranial
mass. Scattered hypodense foci within the periventricular,
subcortical and deep white matter, most likely chronic small vessel
ischemic change. Ventricles are nonenlarged.

Vascular: No hyperdense vessels.  No unexpected calcification.

Skull: No fracture. Focal sclerotic lesion in the left superolateral
orbital rim.

Sinuses/Orbits: No acute finding. Mucosal thickening left maxillary
sinus. Postsurgical change of left maxillary sinus

Other: None
IMPRESSION: 1. No definite CT evidence for acute intracranial abnormality.
Atrophy and presumed chronic small vessel ischemic change of the
white matter
2. Small focus of sclerosis in the left superolateral orbital rim,
indeterminate for metastatic focus given history of prostate cancer.

## 2019-12-29 MED ORDER — SODIUM CHLORIDE 0.9% FLUSH: 3.0000 mL | Freq: Once | INTRAVENOUS | Status: DC

## 2019-12-29 NOTE — ED Provider Notes (Signed)
TIME SEEN: 11:35 PM  CHIEF COMPLAINT: Right-sided numbness and weakness  HPI: Patient is a 74 year old male with history of previous melanoma, prostate cancer who presents to the emergency department with right hand numbness that has been intermittent today and right lower extremity weakness and numbness today.  Patient reports was last normal when he went to bed around 9 PM Tuesday, May 18.  States he got up between 2 and 3 AM on the 19th to go to the bathroom and felt like he was "staggering".  He felt like he could not move his right leg normally.  He states he had to purposely pick up his leg to walk.  He denies any headache or head injury.  He is not on blood thinners.  He reports that he has had 2 episodes of numbness in the right hand that have been transient.  No history of previous stroke or intracranial hemorrhage.  Denies history of hypertension, diabetes, hyperlipidemia, A. fib.  He denies any neck or back pain.  ROS: See HPI Constitutional: no fever  Eyes: no drainage  ENT: no runny nose   Cardiovascular:  no chest pain  Resp: no SOB  GI: no vomiting GU: no dysuria Integumentary: no rash  Allergy: no hives  Musculoskeletal: no leg swelling  Neurological: no slurred speech ROS otherwise negative  PAST MEDICAL HISTORY/PAST SURGICAL HISTORY:  Past Medical History:  Diagnosis Date  . Melanoma in situ of ear, right ([place])   . Prostate cancer ([place])   . Skin cancer     MEDICATIONS:  Prior to Admission medications   Medication Sig Start Date End Date Taking? Authorizing Provider  calcium-vitamin D (OSCAL WITH D) 500-200 MG-UNIT tablet Take 1 tablet by mouth.   Yes [provider]  neomycin-polymyxin-hydrocortisone (CORTISPORIN) OTIC solution Apply 1-2 drops to toe after soaking twice a day 12/16/19  Yes Regal, Tamala Fothergill, DPM  NON FORMULARY Raw Calcium daily   Yes [provider]  NON FORMULARY Immune stimulator daily   Yes [provider]  NON FORMULARY  Skeletal strength daily   Yes [provider]  NON FORMULARY Growth factor daily   Yes [provider]  NON FORMULARY Wheat grass daily   Yes [provider]  Probiotic Product (SOLUBLE FIBER/PROBIOTICS PO) Take by mouth.   Yes [provider]  vitamin C (ASCORBIC ACID) 500 MG tablet Take 500 mg by mouth 2 (two) times daily.   Yes [provider]    ALLERGIES:  Allergies  Allergen Reactions  . Amoxicillin Other (See Comments)    un  . Sulfamethoxazole Other (See Comments)    un    SOCIAL HISTORY:  Social History   Tobacco Use  . Smoking status: Never Smoker  . Smokeless tobacco: Never Used  Substance Use Topics  . Alcohol use: Not Currently    FAMILY HISTORY: Family History  Problem Relation Age of Onset  . Multiple myeloma Father     EXAM: BP 125/75   Pulse 60   Temp 97.9 F (36.6 C) (Oral)   Resp 19   Wt 64.5 kg   SpO2 99%   BMI 22.29 kg/m  CONSTITUTIONAL: Alert and oriented and responds appropriately to questions. Well-appearing; well-nourished HEAD: Normocephalic EYES: Conjunctivae clear, patient has a right prosthetic eye, left pupil is 4 mm and reactive, left EOMI ENT: normal nose; moist mucous membranes NECK: Supple, normal ROM CARD: RRR; S1 and S2 appreciated; no murmurs, no clicks, no rubs, no gallops RESP: Normal chest  excursion without splinting or tachypnea; breath sounds clear and equal bilaterally; no wheezes, no rhonchi, no rales, no hypoxia or respiratory distress, speaking full sentences ABD/GI: Normal bowel sounds; non-distended; soft, non-tender, no rebound, no guarding, no peritoneal signs, no hepatosplenomegaly BACK:  The back appears normal, no midline neck or back tenderness, step-off or deformity EXT: Normal ROM in all joints; no deformity noted, no edema; no cyanosis SKIN: Normal color for age and race; warm; no rash on exposed skin NEURO: Moves all extremities equally, reports diminished  sensation throughout the entire right leg compared to the left but otherwise sensation to light touch is intact diffusely in the arms and face, slightly diminished strength in the dorsi and plantar flexion of the right foot but no foot drop, otherwise strength throughout all muscle groups is 5/5, cranial nerves II through XII intact other than ptosis of the right eyelid and lack of movement of the right forehead he reports is chronic after a Mohs procedure to have melanoma removed years, patient seems to have to purposely pick up his right leg to walk normally and is slightly ataxic PSYCH: The patient's mood and manner are appropriate.   MEDICAL DECISION MAKING: Patient here with complaints of right lower extremity numbness, weakness and intermittent right hand numbness.  He states he feels like he is having to pick his foot up to walk.  Do not appreciate a significant foot drop on exam.  He does also report intermittent right hand numbness and this makes me concerned that he may have had a stroke.  Labs reviewed/interpreted and show no significant abnormality.  Will obtain CT of the head.  Differential also includes brain mass given previous history of prostate cancer as well as melanoma, intracranial hemorrhage although hemorrhage felt less likely.  He has no headache at this time.  Anticipate after CT has been completed that we will discuss with neurology on-call for further recommendations but I feel he will likely need transfer to [facility] for MRI of the head and possibly also the lumbar spine.  He is not a TPA candidate given his symptoms have been present for over 24 hours.  EKG reviewed/interpreted and shows normal sinus rhythm, left anterior fascicular block with no significant change compared to previous.  ED PROGRESS: CT of the head shows no acute intracranial abnormality but he does have a small focus of sclerosis within the left perilateral orbital rim that is concerning for possible  metastatic focus.  Discussed the case with Dr. Cheral Marker on-call for neurology.  Given patient's not a TPA candidate, he feels it is reasonable to obtain MRI of the brain with and without contrast here in the morning and admit to the hospitalist service.  If MRI brain does not show anything to explain patient's symptoms he recommends at that time obtaining MRI of the thoracic and lumbar spine with and without contrast.  He request that the hospitalist service place a formal consult to neurologist if there is an acute abnormality seen on imaging.   3:19 AM Discussed patient's case with hospitalist, Dr. Vallery Ridge.  I have recommended admission and patient (and family if present) agree with this plan. Admitting physician will place admission orders.   I reviewed all nursing notes, vitals, pertinent previous records and reviewed/interpreted all EKGs, lab and urine results, imaging (as available).    EKG Interpretation  Date/Time:  Wednesday Dec 29 2019 17:23:24 EDT Ventricular Rate:  97 PR Interval:  138 QRS Duration: 80 QT Interval:  340 QTC  Calculation: 431 R Axis:   -68 Text Interpretation: Normal sinus rhythm Left anterior fascicular block Cannot rule out Anterior infarct , age undetermined Abnormal ECG No significant change since last tracing Artifact Confirmed by Pryor Curia [phone]) on 12/29/2019 11:35:46 PM         Paul Vasquez was evaluated in Emergency Department on 12/29/2019 for the symptoms described in the history of present illness. He was evaluated in the context of the global COVID-19 pandemic, which necessitated consideration that the patient might be at risk for infection with the SARS-CoV-2 virus that causes COVID-19. Institutional protocols and algorithms that pertain to the evaluation of patients at risk for COVID-19 are in a state of rapid change based on information released by regulatory bodies including the CDC and federal and state organizations. These policies and  algorithms were followed during the patient's care in the ED.      Kashayla Ungerer, Delice Bison, DO 12/30/19 [phone]

## 2019-12-29 NOTE — ED Triage Notes (Signed)
Pt states that when he woke up today around 7am he noticed his balance was off when ambulating and right leg was weak. reports has ankle problem and didn't have his compression sock on that right leg this morning. Pt adds that he is grieving the loss of his wife (died 2019-02-24) and friends brought him here and dropped him off.

## 2019-12-30 ENCOUNTER — Observation Stay (HOSPITAL_COMMUNITY): Payer: Medicare PPO

## 2019-12-30 ENCOUNTER — Observation Stay (HOSPITAL_COMMUNITY)
Admit: 2019-12-30 | Discharge: 2019-12-30 | Disposition: A | Discharge status: Home / Self Care | Payer: Medicare PPO | Attending: Internal Medicine | Admitting: Internal Medicine

## 2019-12-30 ENCOUNTER — Inpatient Hospital Stay (HOSPITAL_COMMUNITY): Payer: Medicare PPO

## 2019-12-30 ENCOUNTER — Encounter (HOSPITAL_COMMUNITY): Payer: Self-pay | Admitting: Internal Medicine

## 2019-12-30 DIAGNOSIS — G8311 Monoplegia of lower limb affecting right dominant side: Secondary | ICD-10-CM | POA: Diagnosis present

## 2019-12-30 DIAGNOSIS — Z9079 Acquired absence of other genital organ(s): Secondary | ICD-10-CM | POA: Diagnosis not present

## 2019-12-30 DIAGNOSIS — R29898 Other symptoms and signs involving the musculoskeletal system: Secondary | ICD-10-CM | POA: Diagnosis present

## 2019-12-30 DIAGNOSIS — I6389 Other cerebral infarction: Secondary | ICD-10-CM

## 2019-12-30 DIAGNOSIS — I639 Cerebral infarction, unspecified: Principal | ICD-10-CM

## 2019-12-30 DIAGNOSIS — L03031 Cellulitis of right toe: Secondary | ICD-10-CM | POA: Diagnosis present

## 2019-12-30 DIAGNOSIS — Z85828 Personal history of other malignant neoplasm of skin: Secondary | ICD-10-CM | POA: Diagnosis not present

## 2019-12-30 DIAGNOSIS — Z880 Allergy status to penicillin: Secondary | ICD-10-CM | POA: Diagnosis not present

## 2019-12-30 DIAGNOSIS — I63312 Cerebral infarction due to thrombosis of left middle cerebral artery: Secondary | ICD-10-CM

## 2019-12-30 DIAGNOSIS — Z807 Family history of other malignant neoplasms of lymphoid, hematopoietic and related tissues: Secondary | ICD-10-CM | POA: Diagnosis not present

## 2019-12-30 DIAGNOSIS — Z20822 Contact with and (suspected) exposure to covid-19: Secondary | ICD-10-CM | POA: Diagnosis present

## 2019-12-30 DIAGNOSIS — I444 Left anterior fascicular block: Secondary | ICD-10-CM | POA: Diagnosis present

## 2019-12-30 DIAGNOSIS — E876 Hypokalemia: Secondary | ICD-10-CM | POA: Diagnosis present

## 2019-12-30 DIAGNOSIS — R29701 NIHSS score 1: Secondary | ICD-10-CM | POA: Diagnosis present

## 2019-12-30 DIAGNOSIS — I10 Essential (primary) hypertension: Secondary | ICD-10-CM | POA: Diagnosis present

## 2019-12-30 DIAGNOSIS — H4920 Sixth [abducent] nerve palsy, unspecified eye: Secondary | ICD-10-CM | POA: Diagnosis present

## 2019-12-30 DIAGNOSIS — Z882 Allergy status to sulfonamides status: Secondary | ICD-10-CM | POA: Diagnosis not present

## 2019-12-30 DIAGNOSIS — Z8249 Family history of ischemic heart disease and other diseases of the circulatory system: Secondary | ICD-10-CM | POA: Diagnosis not present

## 2019-12-30 DIAGNOSIS — Z85820 Personal history of malignant melanoma of skin: Secondary | ICD-10-CM | POA: Diagnosis not present

## 2019-12-30 DIAGNOSIS — Z8546 Personal history of malignant neoplasm of prostate: Secondary | ICD-10-CM | POA: Diagnosis not present

## 2019-12-30 DIAGNOSIS — Z825 Family history of asthma and other chronic lower respiratory diseases: Secondary | ICD-10-CM | POA: Diagnosis not present

## 2019-12-30 DIAGNOSIS — E785 Hyperlipidemia, unspecified: Secondary | ICD-10-CM | POA: Diagnosis present

## 2019-12-30 DIAGNOSIS — Z923 Personal history of irradiation: Secondary | ICD-10-CM | POA: Diagnosis not present

## 2019-12-30 LAB — RAPID URINE DRUG SCREEN, HOSP PERFORMED
Amphetamines: NOT DETECTED
Barbiturates: NOT DETECTED
Benzodiazepines: NOT DETECTED
Cocaine: NOT DETECTED
Opiates: NOT DETECTED
Tetrahydrocannabinol: NOT DETECTED

## 2019-12-30 LAB — LIPID PANEL
Cholesterol: 169 mg/dL (ref 0–200)
HDL: 57 mg/dL (ref >40)
LDL Cholesterol: 105 mg/dL — ABNORMAL HIGH (ref 0–99)
Total CHOL/HDL Ratio: 3 RATIO
Triglycerides: 35 mg/dL (ref <150)
VLDL: 7 mg/dL (ref 0–40)

## 2019-12-30 LAB — ECHOCARDIOGRAM COMPLETE
Height: 67 in
Weight: 2197.55 oz

## 2019-12-30 LAB — COMPREHENSIVE METABOLIC PANEL
ALT: 13 U/L (ref 0–44)
AST: 14 U/L — ABNORMAL LOW (ref 15–41)
Albumin: 3.4 g/dL — ABNORMAL LOW (ref 3.5–5.0)
Alkaline Phosphatase: 56 U/L (ref 38–126)
Anion gap: 9 (ref 5–15)
BUN: 15 mg/dL (ref 8–23)
CO2: 25 mmol/L (ref 22–32)
Calcium: 8 mg/dL — ABNORMAL LOW (ref 8.9–10.3)
Chloride: 105 mmol/L (ref 98–111)
Creatinine, Ser: 0.64 mg/dL (ref 0.61–1.24)
GFR calc Af Amer: >60 mL/min (ref >60)
GFR calc non Af Amer: >60 mL/min (ref >60)
Glucose, Bld: 102 mg/dL — ABNORMAL HIGH (ref 70–99)
Potassium: 3.4 mmol/L — ABNORMAL LOW (ref 3.5–5.1)
Sodium: 139 mmol/L (ref 135–145)
Total Bilirubin: 0.9 mg/dL (ref 0.3–1.2)
Total Protein: 5.5 g/dL — ABNORMAL LOW (ref 6.5–8.1)

## 2019-12-30 LAB — HEMOGLOBIN A1C
Hgb A1c MFr Bld: 6.1 % — ABNORMAL HIGH (ref 4.8–5.6)
Mean Plasma Glucose: 128.37 mg/dL

## 2019-12-30 LAB — ETHANOL: Alcohol, Ethyl (B): <10 mg/dL (ref <10)

## 2019-12-30 LAB — PROTIME-INR
INR: 1 (ref 0.8–1.2)
Prothrombin Time: 13.1 seconds (ref 11.4–15.2)

## 2019-12-30 LAB — APTT: aPTT: 29 seconds (ref 24–36)

## 2019-12-30 LAB — SARS CORONAVIRUS 2 BY RT PCR (HOSPITAL ORDER, PERFORMED IN ~~LOC~~ HOSPITAL LAB): SARS Coronavirus 2: NEGATIVE

## 2019-12-30 LAB — PSA: Prostatic Specific Antigen: 0.1 ng/mL (ref 0.00–4.00)

## 2019-12-30 LAB — TSH: TSH: 1.605 u[IU]/mL (ref 0.350–4.500)

## 2019-12-30 IMAGING — MR MR HEAD WO/W CM
11 of 13 series · 33 of 48 positions shown · IV contrast (gadavist)
Comparison: Head CT same day

CLINICAL DATA: Right lower extremity weakness and numbness.
Intermittent right hand numbness. History of prostate cancer and
melanoma.

EXAM:
MRI HEAD WITHOUT AND WITH CONTRAST
TECHNIQUE: Multiplanar, multiecho pulse sequences of the brain and surrounding
structures were obtained without and with intravenous contrast.
CONTRAST:  6mL GADAVIST GADOBUTROL 1 MMOL/ML IV SOLN

[Series 8: T2 · sagittal · 5.0mm · 0.47mm/px · 3 of 24 slices shown (1 of 2)]
[im 1/24]
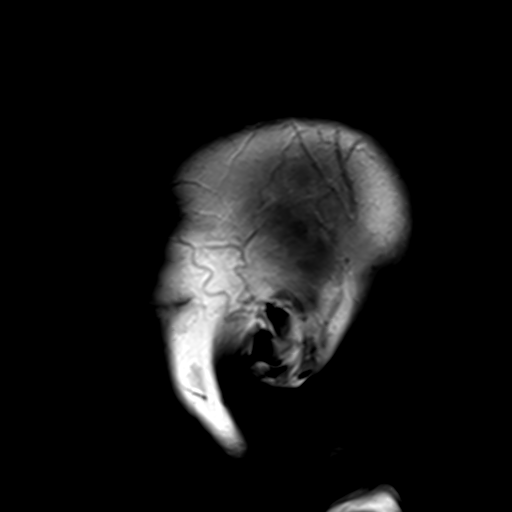
[im 12/24]
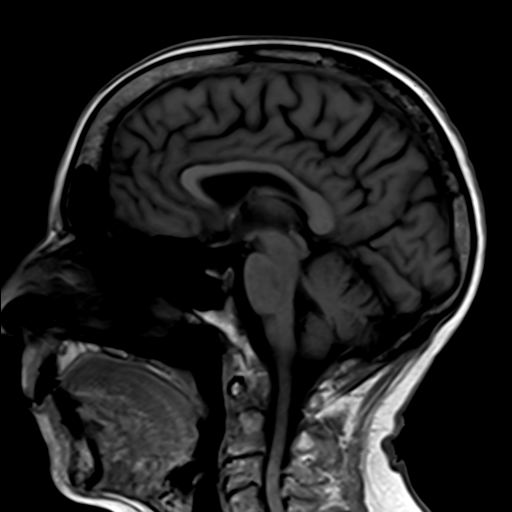
[im 24/24]
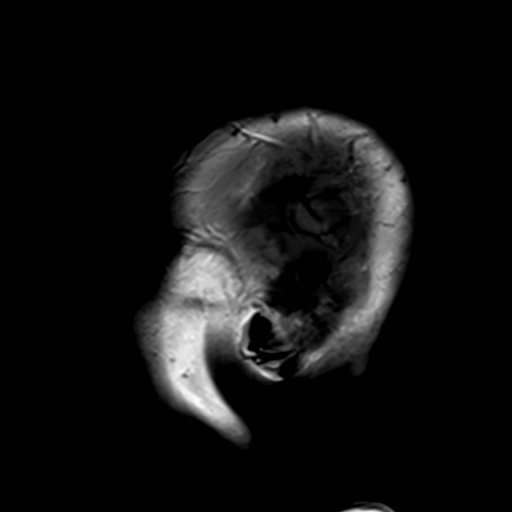

[Series 9: T2 · axial · 5.0mm · 0.45mm/px · z∈[-21,+155]mm · 2 of 29 slices shown (2 of 2)]
[im 1/29]
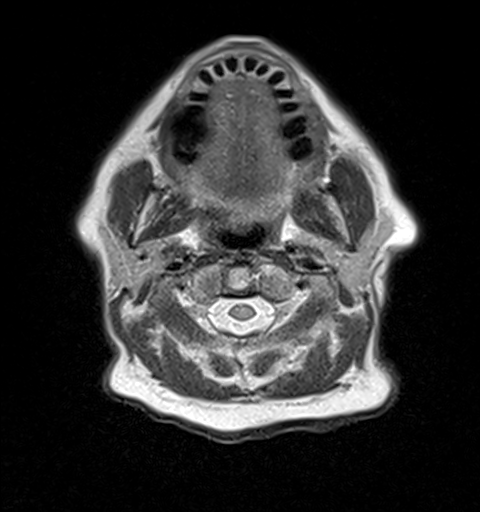
[im 29/29]
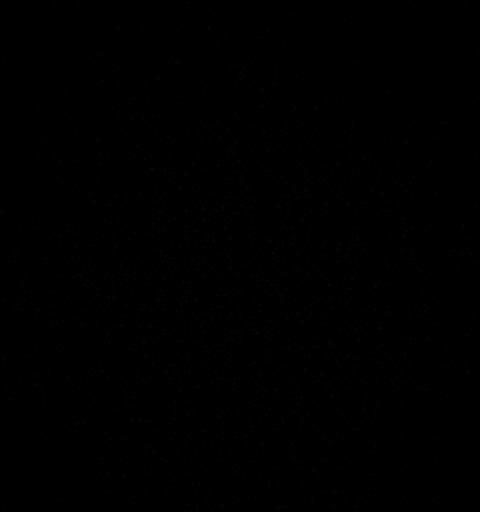

[Series 10: GRE · axial · 3.0mm · 0.45mm/px · z∈[-14,+137]mm · 4 of 53 slices shown]
[im 1/53]
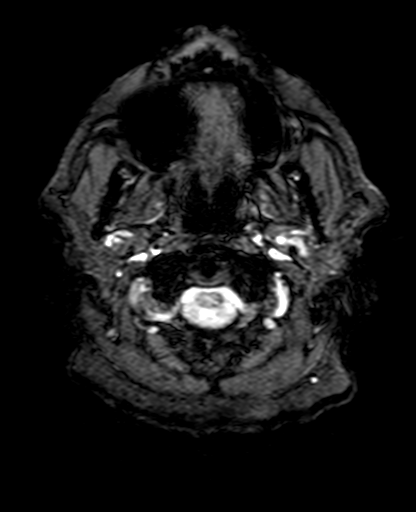
[im 18/53]
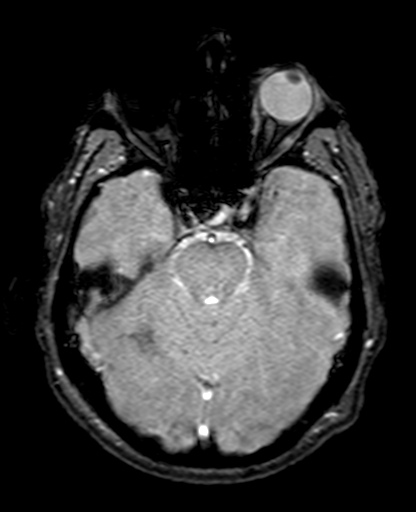
[im 35/53]
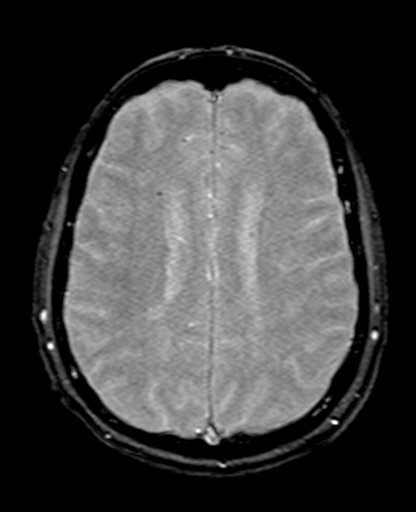
[im 53/53]
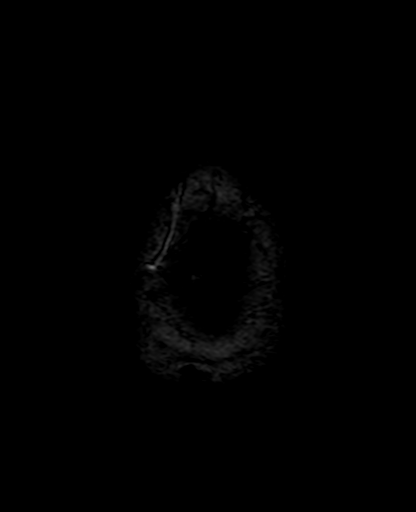

[Series 11: FLAIR · axial · 3.0mm · 0.86mm/px · z∈[-12,+142]mm · 4 of 54 slices shown]
[im 1/54]
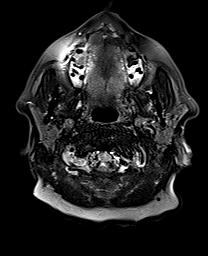
[im 18/54]
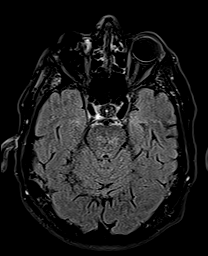
[im 36/54]
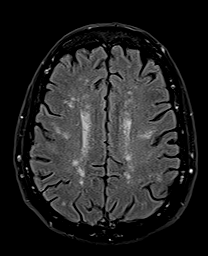
[im 54/54]
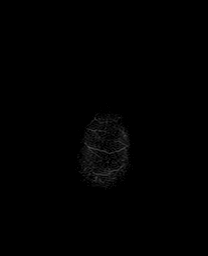

[Series 12: T1 · axial · 3.0mm · 0.45mm/px · z∈[-16,+90]mm · 3 of 55 slices shown]
[im 1/55]
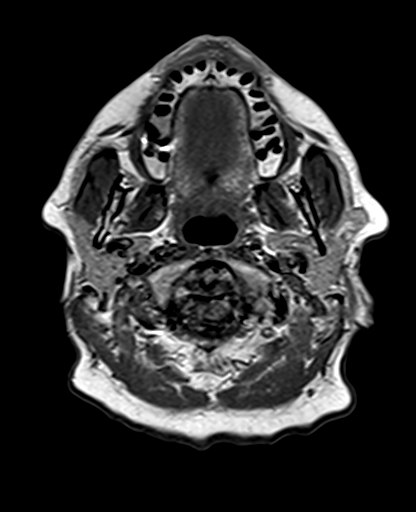
[im 19/55]
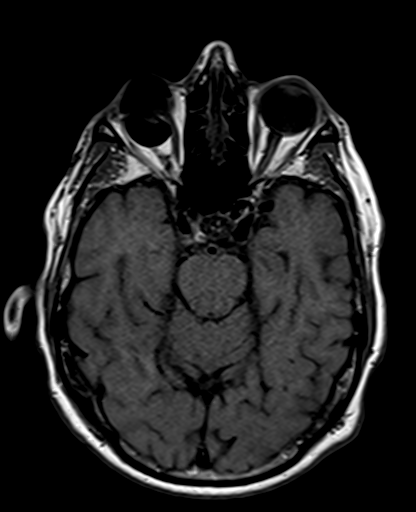
[im 37/55]
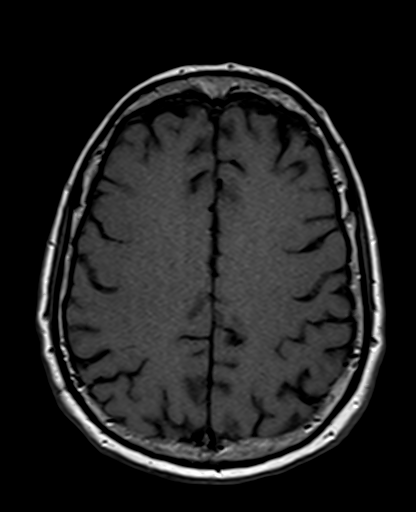

[Series 13: DWI · coronal · 5.0mm · 1.31mm/px · 5 of 60 slices shown (1 of 2)]
[im 1/60]
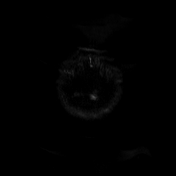
[im 15/60]
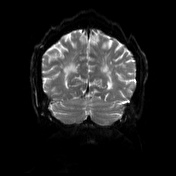
[im 30/60]
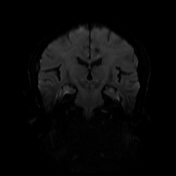
[im 45/60]
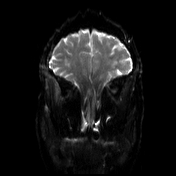
[im 60/60]
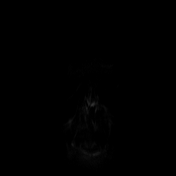

[Series 14: DWI · coronal · 5.0mm · 1.31mm/px · 2 of 30 slices shown (2 of 2)]
[im 1/30]
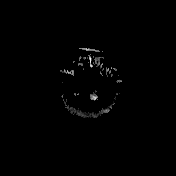
[im 30/30]
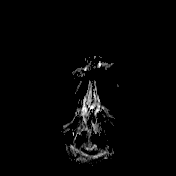

[Series 15: T2 post-contrast · coronal · 5.0mm · 0.86mm/px · 2 of 30 slices shown]
[im 1/30]
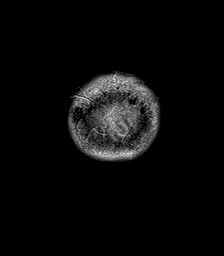
[im 30/30]
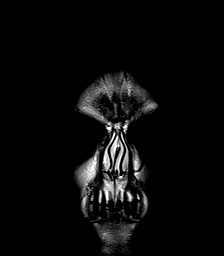

[Series 16: T1 post-contrast · axial · 3.0mm · 0.45mm/px · z∈[-12,+135]mm · 4 of 51 slices shown (1 of 3)]
[im 1/51]
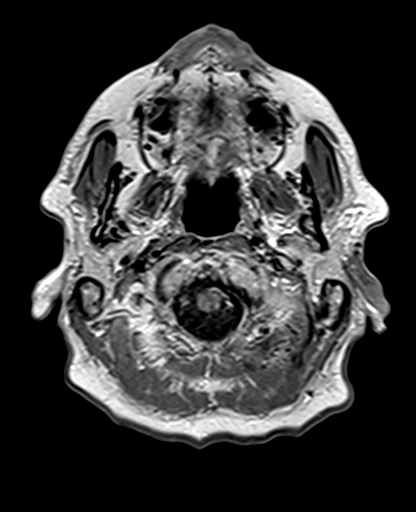
[im 17/51]
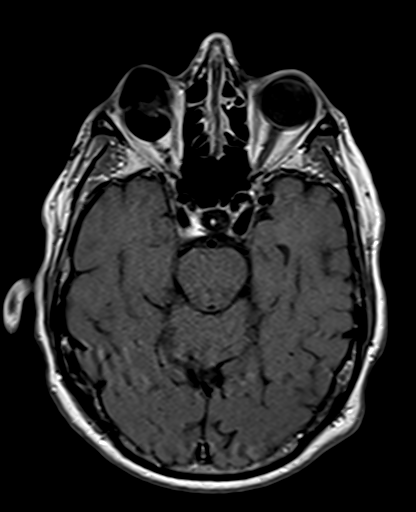
[im 34/51]
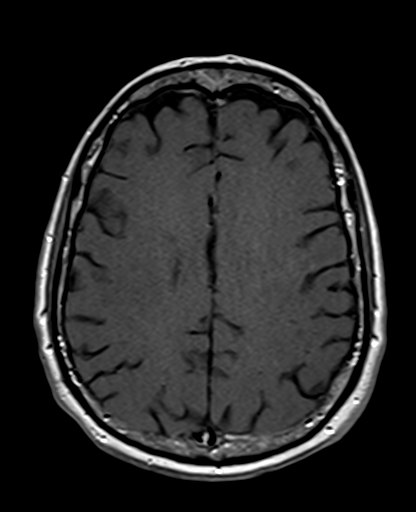
[im 51/51]
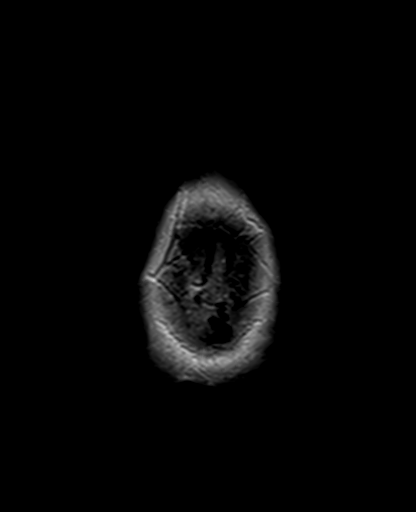

[Series 17: T1 post-contrast · coronal · 5.0mm · 0.43mm/px · 2 of 30 slices shown (2 of 3)]
[im 1/30]
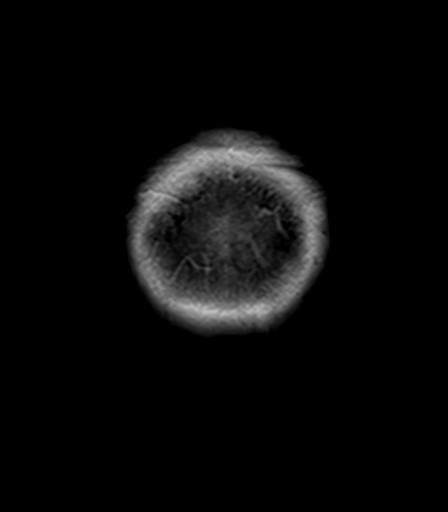
[im 30/30]
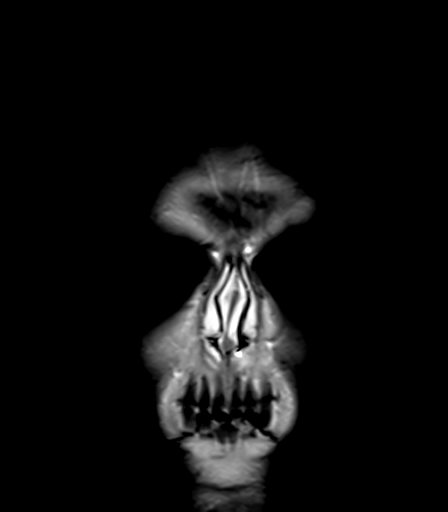

[Series 18: T1 post-contrast · sagittal · 5.0mm · 0.94mm/px · 2 of 24 slices shown (3 of 3)]
[im 1/24]
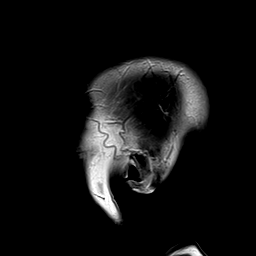
[im 24/24]
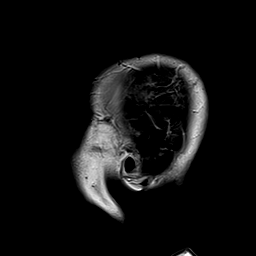

[33 of 48 positions shown; findings below may reference images not displayed]

FINDINGS: Brain: Diffusion imaging shows a 6 mm acute infarction in the left
posterior basal ganglia or posterior limb internal capsule. No other
acute infarction. Chronic small-vessel ischemic changes affect the
pons. Extensive chronic small-vessel ischemic changes are present
elsewhere throughout the cerebral hemispheric white matter. No
cortical or large vessel territory infarction. No mass lesion,
recent hemorrhage, hydrocephalus or extra-axial collection. Few foci
of hemosiderin deposition related to some of the old small vessel
infarctions.

Vascular: Major vessels at the base of the brain show flow.

Skull and upper cervical spine: Negative. No notable finding related
to the sclerotic focus in the left supraorbital region described by
CT. This remains indeterminate.

Sinuses/Orbits: Sinuses are clear. Phthisis bulbi on the right with
prosthesis.

Other: None
IMPRESSION: 6 mm acute infarction affecting the left posterior basal
ganglia/posterior limb internal capsule. No mass effect or
hemorrhage in that location.

Pronounced chronic small-vessel ischemic changes elsewhere
throughout the cerebral hemispheric white matter pons.

## 2019-12-30 IMAGING — CT CT ANGIO NECK
2 of 7 series · 8 of 33 positions shown · IV contrast (OMNI 350)
Comparison: Previous CT and MRI from earlier same day.

CLINICAL DATA: Follow-up examination for acute stroke.

EXAM:
CT ANGIOGRAPHY HEAD AND NECK
TECHNIQUE: Multidetector CT imaging of the head and neck was performed using
the standard protocol during bolus administration of intravenous
contrast. Multiplanar CT image reconstructions and MIPs were
obtained to evaluate the vascular anatomy. Carotid stenosis
measurements (when applicable) are obtained utilizing NASCET
criteria, using the distal internal carotid diameter as the
denominator.
CONTRAST:  75mL OMNIPAQUE IOHEXOL 350 MG/ML SOLN

[Series 5: cta neck · axial · 0.43mm/px · z∈[+348,+468]mm · 2 of 181 slices shown]
[im 61/181  soft-tissue]
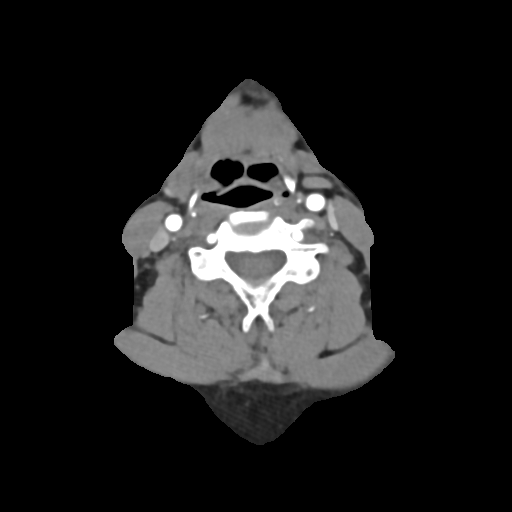
[im 121/181  soft-tissue]
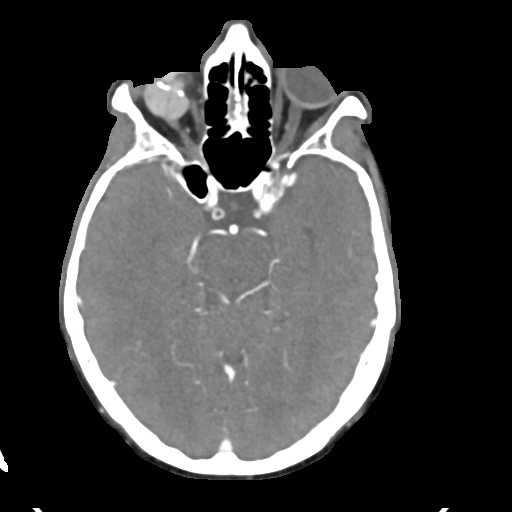

[Series 7: cta neck axial · axial · 0.39mm/px · z∈[+272,+527]mm · 6 of 358 slices shown]
[im 52/358  soft-tissue]
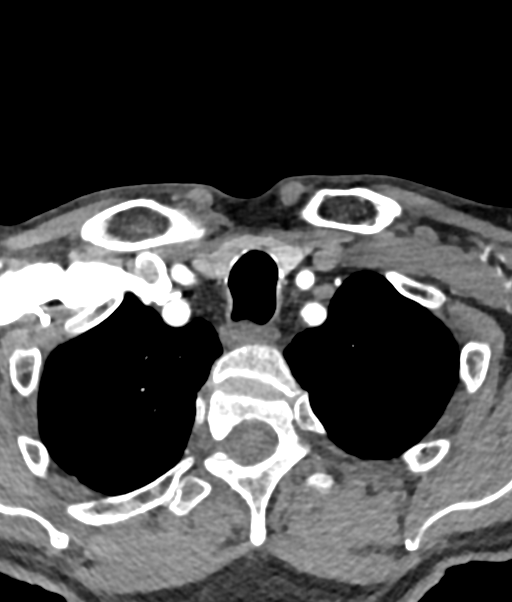
[im 103/358  bone]
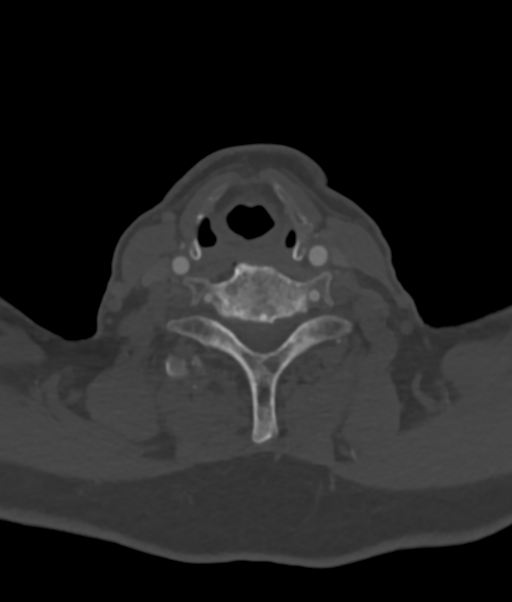
[im 154/358  soft-tissue]
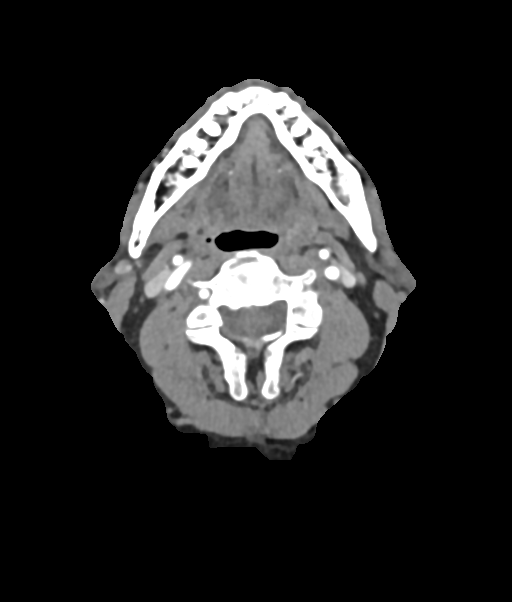
[im 205/358  bone]
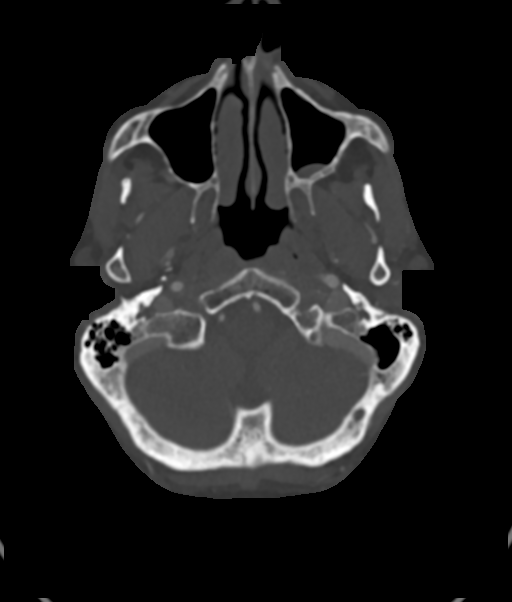
[im 256/358  soft-tissue]
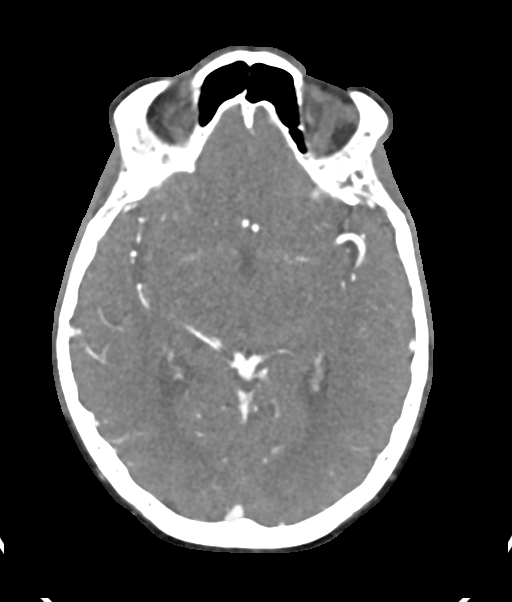
[im 307/358  bone]
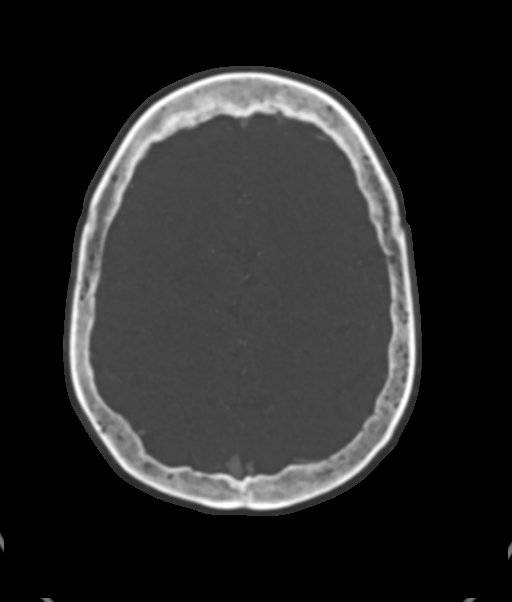

[8 of 33 positions shown; findings below may reference images not displayed]

FINDINGS: CTA NECK FINDINGS

Aortic arch: Visualized aortic arch of normal caliber with normal
branch pattern. No hemodynamically significant stenosis seen about
the origin of the great vessels. Partially visualized subclavian
arteries widely patent.

Right carotid system: Right common and internal carotid arteries
widely patent without stenosis, dissection or occlusion.

Left carotid system: Left common and internal carotid arteries
widely patent without stenosis, dissection or occlusion.

Vertebral arteries: Both vertebral arteries arise from the
subclavian arteries. Vertebral arteries widely patent without
stenosis, dissection or occlusion.

Skeleton: No acute osseous abnormality. No discrete or worrisome
osseous lesions. Mild cervical spondylosis present at C5-6 and C6-7.

Other neck: No other acute soft tissue abnormality within the neck.
No mass lesion or adenopathy.

Upper chest: Visualized upper chest demonstrates no acute finding.

Review of the MIP images confirms the above findings

CTA HEAD FINDINGS

Anterior circulation: Petrous segments widely patent. Mild scattered
atheromatous plaque within the left carotid siphon without
flow-limiting stenosis. Right ICA widely patent to the terminus. A1
segments widely patent. Normal anterior communicating artery.
Anterior cerebral arteries widely patent to their distal aspects
without stenosis. No M1 stenosis or occlusion. Normal MCA
bifurcations. Distal MCA branches well perfused and symmetric.

Posterior circulation: Both vertebral arteries widely patent to the
vertebrobasilar junction without stenosis. Neither PICA well
visualized. Basilar widely patent to its distal aspect without
stenosis. Superior cerebellar and posterior cerebral arteries widely
patent bilaterally.

Venous sinuses: Patent.

Anatomic variants: None significant.

Review of the MIP images confirms the above findings
IMPRESSION: 1. Negative CTA of the head and neck. No large vessel occlusion,
hemodynamically significant stenosis, or other abnormality.
2. Mild for age atheromatous change within the left carotid siphon.
No other significant atherosclerotic disease about the major
arterial vasculature of the head and neck.

## 2019-12-30 MED ORDER — ASPIRIN EC 81 MG PO TBEC
81.0000 mg | DELAYED_RELEASE_TABLET | Freq: Every day | ORAL | Status: DC
  Administered 2019-12-30 – 2019-12-31 (×2): 81 mg via ORAL
  Filled 2019-12-30 (×2): qty 1

## 2019-12-30 MED ORDER — LABETALOL HCL 5 MG/ML IV SOLN: 10.0000 mg | INTRAVENOUS | Status: DC | PRN

## 2019-12-30 MED ORDER — IOHEXOL 350 MG/ML SOLN
75.0000 mL | Freq: Once | INTRAVENOUS | Status: AC | PRN
  Administered 2019-12-30: 75 mL via INTRAVENOUS

## 2019-12-30 MED ORDER — ACETAMINOPHEN 160 MG/5ML PO SOLN: 650.0000 mg | ORAL | Status: DC | PRN

## 2019-12-30 MED ORDER — STROKE: EARLY STAGES OF RECOVERY BOOK
Freq: Once | Status: AC
  Filled 2019-12-30 (×2): qty 1

## 2019-12-30 MED ORDER — GADOBUTROL 1 MMOL/ML IV SOLN
6.0000 mL | Freq: Once | INTRAVENOUS | Status: AC | PRN
  Administered 2019-12-30: 6 mL via INTRAVENOUS

## 2019-12-30 MED ORDER — ATORVASTATIN CALCIUM 40 MG PO TABS
40.0000 mg | ORAL_TABLET | Freq: Every day | ORAL | Status: DC
  Administered 2019-12-30 – 2019-12-31 (×2): 40 mg via ORAL
  Filled 2019-12-30 (×2): qty 1

## 2019-12-30 MED ORDER — HEPARIN SODIUM (PORCINE) 5000 UNIT/ML IJ SOLN
5000.0000 [IU] | Freq: Three times a day (TID) | INTRAMUSCULAR | Status: DC
  Administered 2019-12-30 – 2019-12-31 (×5): 5000 [IU] via SUBCUTANEOUS
  Filled 2019-12-30 (×5): qty 1

## 2019-12-30 MED ORDER — POTASSIUM CHLORIDE CRYS ER 20 MEQ PO TBCR
40.0000 meq | EXTENDED_RELEASE_TABLET | Freq: Once | ORAL | Status: AC
  Administered 2019-12-30: 40 meq via ORAL
  Filled 2019-12-30: qty 2

## 2019-12-30 MED ORDER — LORAZEPAM 0.5 MG PO TABS
0.5000 mg | ORAL_TABLET | ORAL | Status: DC | PRN
  Administered 2019-12-30: 0.5 mg via ORAL
  Filled 2019-12-30: qty 1

## 2019-12-30 MED ORDER — ACETAMINOPHEN 325 MG PO TABS: 650.0000 mg | ORAL_TABLET | ORAL | Status: DC | PRN

## 2019-12-30 MED ORDER — ACETAMINOPHEN 650 MG RE SUPP: 650.0000 mg | RECTAL | Status: DC | PRN

## 2019-12-30 NOTE — Progress Notes (Signed)
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 On presentation he was hemodynamically stable.  CT head did not show any acute intracranial abnormalities. MRI of the brain showed 6 mm acute infarction affecting the left posterior basal ganglia/posterior limb internal capsule. Neurology consulted.  Stroke work-up initiated.  Started on aspirin and Lipitor.  Hemoglobin A1c of 6.1.  Lipid panel will be checked. Patient seen and examined at the bedside this morning.  He is currently hemodynamically stable.  Examination findings: General exam: Appears calm and comfortable ,Not in distress,average built HEENT: Artificial right eye, absence of skin folds on the right supraorbital area Respiratory system: Bilateral equal air entry, normal vesicular breath sounds, no wheezes or crackles  Cardiovascular system: S1 & S2 heard, RRR. No JVD, murmurs, rubs, gallops or clicks. Gastrointestinal system: Abdomen is nondistended, soft and nontender. No organomegaly or masses felt. Normal bowel sounds heard. Central nervous system: Alert and oriented.  Motor: 5/5 on LUE,LLE 4+/5 on RLE, 5/5 on RUE Extremities: No edema, no clubbing ,no cyanosis Skin: No rashes, lesions or ulcers,no icterus ,no pallor  A/P:  Acute nonhemorrhagic stroke: MRI findings above. I have discussed with neurology and he has been transferred to [facility] for further work-up.  TTE and carotid Doppler has been ordered.  PT/OT/speech evaluation has been ordered. Started on aspirin 81 mg, Lipitor 40 mg daily.  Hypertension: Currently blood pressure stable.  Allow permissive hypertension.  Normalize  blood pressure better in 5 to 7 days.  As needed blood pressures medications for severe HTN.  Prostate cancer: Status post prostatectomy, radiation, on Lupron shots Follows with Dr. Alinda Money, urology CT showed small focus of sclerosis in left superior lateral orbital rim Indeterminate for metastatic focus given history of prostate CA.Monitor as an outpatient.  Hypokalemia: Supplemented with potassium.

## 2019-12-30 NOTE — Evaluation (Signed)
Speech Language Pathology Evaluation Patient Details Name: Paul Vasquez MRN: [id number] DOB: [birth year] Today's Date: 12/30/2019 Time: [id number] SLP Time Calculation (min) (ACUTE ONLY): 33 min  Problem List:  Patient Active Problem List   Diagnosis Date Noted  . Right leg weakness 12/30/2019  . Transient weakness of right lower extremity 12/30/2019  . 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 Neoplasm of uncertain behavior of skin of face 03/31/2019  . Age-related osteoporosis without current pathological fracture 10/04/2018  . H/O enucleation of right eyeball 05/22/2017  . Actinic keratoses 10/11/2015  . Benign essential hypertension 10/11/2015  . Encounter for general adult medical examination without abnormal findings 10/11/2015  . 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 Malignant neoplasm of prostate ([place]) 10/11/2015  . 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 Melanoma in situ of ear, right ([place])   . Prostate cancer ([place])   . Skin cancer    Past Surgical History:  Past Surgical History:  Procedure Laterality Date  . MOHS SURGERY    . PROSTATECTOMY     HPI:  Paul Vasquez is a 74 y.o. male with HTN, history of prostate cancer status post prostatectomy/radiation on Lupron shots, history of melanoma status post enucleation of the right eye, SCC of right temple status post Mohs repair/radiation who presented to ED for right lower extremity numbness. Brain MRI reveals: 6 mm acute infarction affecting the left posterior basalganglia/posterior limb internal capsule.   Assessment / Plan / Recommendation Clinical Impression  [name] demonstrates cognitive-linguistic abilities WNL. Pt reports feeling he intermittently has brain fog, but attributes this to his depression after losing his wife last summer. He is seeing a Social worker for this weekly. He denies any difficulty caring for himself  and handles all IADLs baseline. He completed the [facility] Cognitive Assessment (MoCA) with a perfect score (30/30), demonstrating functional memory, visual processing, speech/language, attention, abstraction, and orientation. Given informal judgment questions, pt was able to state functional solutions for problems. He answered medication management questions with acc'y in 5/5. Pt appears safe to return to independent living/handling IADLs from a cognitive standpoint. No further ST indicated at this time.   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 This is NOT a new facial droop and does not impact oral components.  MoCA: Visuospatial/Executive: 5/5 Naming: 3/3 Attention: 6/6 Language: 3/3 Abstraction: 2/2 Delayed Recall: 5/5 Orientation: 6/6 Total: 30/30     SLP Assessment  SLP Recommendation/Assessment: Patient does not need any further Speech Lanaguage Pathology Services SLP Visit Diagnosis: Cognitive communication deficit (R41.841)    Follow Up Recommendations  None       SLP Evaluation Cognition  Overall Cognitive Status: Within Functional Limits for tasks assessed Arousal/Alertness: Awake/alert Orientation Level: Oriented X4 Attention: Focused;Sustained Focused Attention: Appears intact Sustained Attention: Appears intact Memory: Appears intact Awareness: Appears intact Problem Solving: Appears intact Executive Function: Reasoning;Sequencing;Organizing;Decision Making;Initiating;Self Monitoring;Self Correcting Reasoning: Appears intact Sequencing: Appears intact Organizing: Appears intact Decision Making: Appears intact Initiating: Appears intact Self Monitoring: Appears intact Self Correcting: Appears intact Safety/Judgment: Appears intact       Comprehension  Auditory Comprehension Overall Auditory Comprehension: Appears within functional limits for tasks assessed Yes/No Questions: Within Functional Limits Commands: Within Functional  Limits Visual Recognition/Discrimination Discrimination: Within Function Limits Reading Comprehension Reading Status: Within funtional limits    Expression Expression Primary Mode of Expression: Verbal Verbal Expression Overall Verbal Expression: Appears within functional  limits for tasks assessed Written Expression Dominant Hand: Right Written Expression: Within Functional Limits   Oral / Motor  Oral Motor/Sensory Function Overall Oral Motor/Sensory Function: Other (comment)(R sided upper facial paralysis 2/2 CA tx/surgery) Motor Speech Overall Motor Speech: Appears within functional limits for tasks assessed Respiration: Within functional limits Phonation: Normal Resonance: Within functional limits Articulation: Within functional limitis Intelligibility: Intelligible Motor Planning: Witnin functional limits Motor Speech Errors: Not applicable              Ormond Lazo P. Chyrel Taha, M.S., [place] Pathologist Acute Rehabilitation Services Pager: [place] 12/30/2019, 11:10 AM

## 2019-12-30 NOTE — Progress Notes (Signed)
Carotid duplex       has been completed. Preliminary results can be found under CV proc through chart review. Paul Vasquez, BS, RDMS, RVT   

## 2019-12-30 NOTE — Progress Notes (Signed)
Pt arrived to the unit via [facility] transport. Pt sitting up in bed, alert and oriented. Pt has no complaints of pain or discomfort noted. Pt on telemetry with SR on the monitor. Call button is within reach. Bed at lowest position and bed alarm set.

## 2019-12-30 NOTE — Care Management Obs Status (Signed)
[place] NOTIFICATION   Patient Details  Name: Paul Vasquez MRN: [id number] Date of Birth: 1945/10/05   Medicare Observation Status Notification Given:  Yes    MahabirJuliann Pulse, RN 12/30/2019, 12:00 PM

## 2019-12-30 NOTE — Progress Notes (Signed)
Pt transferred to [facility] today per Dr. Tawanna Solo. Pt's IV site patent and site WDL. Pt's VSS. Report given to Onalee Hua, RN with [facility] and receiving RN Levada Dy on 3W at [facility] Verbalized understanding. Pt left floor in stable condition via stretcher accompanied by [employer]  staff.

## 2019-12-30 NOTE — H&P (Addendum)
History and Physical    Paul Vasquez GEX:[id number] DOB: [birth year] DOA: 12/29/2019  PCP: [name], MD   Patient coming from:   I have personally briefly reviewed patient's old medical records in [place]  Chief Complaint: Right lower extremity weakness.  HPI: Paul Vasquez is a 74 y.o. 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  He reports that woke up at 2 AM and noticed staggered gait bumping into stuff.  Subsequently went back to sleep however woke up with pain and was persistently unsteady.  He got concerned about his symptoms but noticed it was difficult for him to get in and out of the car and the right leg would not move as expected.  Later in the course of the day the right hand became numb therefore decided come to the ED for further evaluation.  He denies any trouble speaking or swallowing.  Denies any back or neck pain.  Denies any fever chills or cough.  No chest pain palpitations.  Patient prescribed cefadroxil 500 twice daily for 7 days for acute paronychia of right toe.   Personal social history: The patient is currently widowed.  Never smoked .  Occasional EtOH.  Denies any illicit drug use. He works as a IT consultant.  Family history: Father with CAD and multiple myeloma.  Mother with asthma.    ED Course:  Blood pressure 126/73-149/79, HR 61, RR 20, SPO2 99% room air  -Labs with sodium 140, potassium 4, chloride 102, bicarb 27. -BUN 20, creatinine 0.86 WBC 5, Hb 14.6, platelets 239 UA unremarkable. -UDS and EtOH level unremarkable -COVID-19 PCR negative.  -Head CT unremarkable except for small focus of sclerosis in left superior lateral orbital rim.  Indeterminate for  metastatic focus given history of prostate cancer.  -EKG with NSR at 97, LAD, left anterior fascicular block.  Review of Systems: As per HPI otherwise 10 point review of systems negative.   Review of Systems  Constitutional: Negative.   HENT: Positive for hearing loss. Negative for congestion, ear discharge, ear pain, nosebleeds, sinus pain and tinnitus.   Eyes: Negative.   Respiratory: Negative.   Cardiovascular: Negative for chest pain, palpitations, orthopnea, claudication, leg swelling and PND.  Gastrointestinal: Negative.   Genitourinary: Negative.   Musculoskeletal: Negative.   Skin: Negative.   Neurological: Positive for sensory change and weakness.  Endo/Heme/Allergies: Negative.   Psychiatric/Behavioral: Negative.     Past Medical History:  Diagnosis Date  . Melanoma in situ of ear, right ([place])   . Prostate cancer ([place])   . Skin cancer     Past Surgical History:  Procedure Laterality Date  . MOHS SURGERY    . PROSTATECTOMY       reports that he has never smoked. He has never used smokeless tobacco. He reports previous alcohol use. He reports that he does not use drugs.  Allergies  Allergen Reactions  . Amoxicillin Other (See Comments)    un  . Sulfamethoxazole Other (See Comments)    un    Family History  Problem Relation Age of Onset  . Multiple myeloma Father      Prior to Admission medications   Medication Sig Start Date End Date Taking? Authorizing Provider  calcium-vitamin D (OSCAL WITH D) 500-200 MG-UNIT  tablet Take 1 tablet by mouth.   Yes [provider]  neomycin-polymyxin-hydrocortisone (CORTISPORIN) OTIC solution Apply 1-2 drops to toe after soaking twice a day 12/16/19  Yes Regal, Tamala Fothergill, DPM  NON FORMULARY Raw Calcium daily   Yes [provider]  NON FORMULARY Immune stimulator daily   Yes [provider]  NON FORMULARY Skeletal strength daily   Yes [provider]  NON FORMULARY Growth factor daily    Yes [provider]  NON FORMULARY Wheat grass daily   Yes [provider]  Probiotic Product (SOLUBLE FIBER/PROBIOTICS PO) Take by mouth.   Yes [provider]  vitamin C (ASCORBIC ACID) 500 MG tablet Take 500 mg by mouth 2 (two) times daily.   Yes [provider]    Physical Exam: Vitals:   12/29/19 2315 12/30/19 0017 12/30/19 0100 12/30/19 0215  BP: 126/73 140/87 (!) 149/79 119/68  Pulse: 61 64 65 (!) 55  Resp: '20 19 16 16  ' Temp:      TempSrc:      SpO2: 99% 99% 98% 97%  Weight:        Constitutional: NAD, calm, comfortable Vitals:   12/29/19 2315 12/30/19 0017 12/30/19 0100 12/30/19 0215  BP: 126/73 140/87 (!) 149/79 119/68  Pulse: 61 64 65 (!) 55  Resp: '20 19 16 16  ' Temp:      TempSrc:      SpO2: 99% 99% 98% 97%  Weight:       Eyes: PERRL, lids and conjunctivae normal ENMT: Mucous membranes are moist. Posterior pharynx clear of any exudate or lesions.Normal dentition.  Neck: normal, supple, no masses, no thyromegaly Respiratory: clear to auscultation bilaterally, no wheezing, no crackles. Normal respiratory effort. No accessory muscle use.  Cardiovascular: Regular rate and rhythm, no murmurs / rubs / gallops. No extremity edema. 2+ pedal pulses. No carotid bruits.  Abdomen: no tenderness, no masses palpated. No hepatosplenomegaly. Bowel sounds positive.  Musculoskeletal: no clubbing / cyanosis. No joint deformity upper and lower extremities. Good ROM, no contractures. Normal muscle tone.  Skin: no rashes, lesions, ulcers. No induration Neurologic: CN 2-12 grossly intact. Sensation intact, DTR normal. Strength 5/5 in all 4.  Psychiatric: Normal judgment and insight. Alert and oriented x 3. Normal mood.   Labs on Admission: I have personally reviewed following labs and imaging studies  CBC: Recent Labs  Lab 12/29/19 2003  WBC 5.3  HGB 14.6  HCT 43.8  MCV 96.1  PLT 585   Basic Metabolic Panel: Recent Labs  Lab 12/29/19 2003    NA 140  K 4.0  CL 102  CO2 27  GLUCOSE 143*  BUN 20  CREATININE 0.86  CALCIUM 9.2   GFR: Estimated Creatinine Clearance: 66.7 mL/min (by C-G formula based on SCr of 0.86 mg/dL). Liver Function Tests: No results for input(s): AST, ALT, ALKPHOS, BILITOT, PROT, ALBUMIN in the last 168 hours. No results for input(s): LIPASE, AMYLASE in the last 168 hours. No results for input(s): AMMONIA in the last 168 hours. Coagulation Profile: Recent Labs  Lab 12/29/19 2336  INR 1.0   Cardiac Enzymes: No results for input(s): CKTOTAL, CKMB, CKMBINDEX, TROPONINI in the last 168 hours. BNP (last 3 results) No results for input(s): PROBNP in the last 8760 hours. HbA1C: No results for input(s): HGBA1C in the last 72 hours. CBG: Recent Labs  Lab 12/29/19 2223  GLUCAP 110*   Lipid Profile: No results for input(s): CHOL, HDL, LDLCALC, TRIG, CHOLHDL, LDLDIRECT in the last 72  hours. Thyroid Function Tests: No results for input(s): TSH, T4TOTAL, FREET4, T3FREE, THYROIDAB in the last 72 hours. Anemia Panel: No results for input(s): VITAMINB12, FOLATE, FERRITIN, TIBC, IRON, RETICCTPCT in the last 72 hours. Urine analysis:    Component Value Date/Time   COLORURINE YELLOW 12/29/2019 2100   APPEARANCEUR CLEAR 12/29/2019 2100   LABSPEC 1.017 12/29/2019 2100   PHURINE 5.0 12/29/2019 2100   GLUCOSEU NEGATIVE 12/29/2019 2100   HGBUR NEGATIVE 12/29/2019 2100   BILIRUBINUR NEGATIVE 12/29/2019 2100   KETONESUR 5 (A) 12/29/2019 2100   PROTEINUR NEGATIVE 12/29/2019 2100   UROBILINOGEN 0.2 10/18/2010 1501   NITRITE NEGATIVE 12/29/2019 2100   LEUKOCYTESUR NEGATIVE 12/29/2019 2100    Radiological Exams on Admission: CT HEAD WO CONTRAST  Result Date: 12/30/2019 CLINICAL DATA:  Right lower extremity numbness and weakness EXAM: CT HEAD WITHOUT CONTRAST TECHNIQUE: Contiguous axial images were obtained from the base of the skull through the vertex without intravenous contrast. COMPARISON:  None.  FINDINGS: Brain: No acute territorial infarction, hemorrhage or intracranial mass. Scattered hypodense foci within the periventricular, subcortical and deep white matter, most likely chronic small vessel ischemic change. Ventricles are nonenlarged. Vascular: No hyperdense vessels.  No unexpected calcification. Skull: No fracture. Focal sclerotic lesion in the left superolateral orbital rim. Sinuses/Orbits: No acute finding. Mucosal thickening left maxillary sinus. Postsurgical change of left maxillary sinus Other: None IMPRESSION: 1. No definite CT evidence for acute intracranial abnormality. Atrophy and presumed chronic small vessel ischemic change of the white matter 2. Small focus of sclerosis in the left superolateral orbital rim, indeterminate for metastatic focus given history of prostate cancer. Electronically Signed   By: Donavan Foil M.D.   On: 12/30/2019 00:23    EKG: Independently reviewed.  Results as above. Assessment/Plan Active Problems:   Right leg weakness   #Right lower extremity weakness/ataxia -Awaiting MRI pain with and without contrast  to rule out CVA. -Also duplex carotids, TTE. -ED spoke with Kerney Elbe from Neurology.  -PT/OT -ASA/Lipitor.  #Small focus of sclerosis in left superior lateral orbital rim -Indeterminate for metastatic focus given history of prostate CA. -PSA pending. -We will refer to outpatient urologist [facility]  DVT prophylaxis: Subcu heparin.    Code Status: Full code Family Communication: None   disposition Plan: Home PT versus SNF.    Consults called none. Admission status: Observation.   Kasmira Cacioppo MD [facility]   If 7PM-7AM, please contact night-coverage [email or web address] Password [facility]  12/30/2019, 2:49 AM

## 2019-12-30 NOTE — Consult Note (Addendum)
Neurology Consultation  Reason for Consult: Stroke Referring Physician: Shelly Coss, MD  CC: Right leg weakness  History is obtained from: Patient  HPI: Paul Vasquez is a 74 y.o. 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  Patient states that on Tuesday night he was in bed around 930.  He was reading the paper and went to sleep about 10 PM.  He got up around 2 AM to go to the bathroom and noted that he was unsteady with his gait and bumped into the door frame.  He later got up the next morning noticed he had difficulty controlling his right foot and leg.  At one point he was walking up the stairs and noted that his right leg did not lift all the way and he stubbed his toe on the stairs.  Later that day he was walking around a graveyard with his friends and noticed that his right leg was not working properly.  He also noticed that he had a foggy feeling in his head.  The next day on Wednesday he was driving his car and felt "off".  At that time he denies any paresthesias or numbness.  Yesterday on 12/29/2019 he did note some tingling in his right hand from his wrist down but this dissipated in a few minutes however it did return.  For that reason he did go to the ED at 1630 hrs. yesterday.  Currently he is not feeling any significant symptoms.  Patient states he does not take aspirin and or any cholesterol-lowering drugs.  He also states that he has not had any symptoms like this in the past.  ED course  Relevant labs include -HbA1c 6.1, glucose 102  CT head shows-scan of the brain-no definite CT evidence of acute intracranial abnormality.  Atrophy and presumed chronic small vessel ischemic change of the white matter.  Preliminary carotid ultrasound showed no significant carotid stenosis   LKW: 10 PM on  12/27/2019 tpa given?: no, out of window Premorbid modified Rankin scale (mRS): 0 NIH stroke score of 1 for right nasolabial fold decrease  Past Medical History:  Diagnosis Date  . Melanoma in situ of ear, right ([place])   . Prostate cancer ([place])   . Skin cancer     Family History  Problem Relation Age of Onset  . Multiple myeloma Father    Social History:   reports that he has never smoked. He has never used smokeless tobacco. He reports previous alcohol use. He reports that he does not use drugs.  Medications  Current Facility-Administered Medications:  .  acetaminophen (TYLENOL) tablet 650 mg, 650 mg, Oral, Q4H PRN **OR** acetaminophen (TYLENOL) 160 MG/5ML solution 650 mg, 650 mg, Per Tube, Q4H PRN **OR** acetaminophen (TYLENOL) suppository 650 mg, 650 mg, Rectal, Q4H PRN, Mujtaba, Mohammadtokir, MD .  aspirin EC tablet 81 mg, 81 mg, Oral, Daily, Mujtaba, Mohammadtokir, MD, 81 mg at 12/30/19 1034 .  atorvastatin (LIPITOR) tablet 40 mg, 40 mg, Oral, Daily, Mujtaba, Mohammadtokir, MD, 40 mg at 12/30/19 1033 .  heparin injection 5,000 Units, 5,000 Units, Subcutaneous, Q8H, Mujtaba, Mohammadtokir, MD, 5,000 Units at 12/30/19 1508 .  labetalol (NORMODYNE) injection 10 mg, 10 mg, Intravenous, Q2H PRN, Adhikari, Amrit, MD .  LORazepam (ATIVAN) tablet 0.5 mg, 0.5 mg, Oral, Q4H PRN, Shelly Coss, MD, 0.5 mg at 12/30/19  2536 .  sodium chloride flush (NS) 0.9 % injection 3 mL, 3 mL, Intravenous, Once, Mujtaba, Mohammadtokir, MD  ROS:   General ROS: negative for - chills, fatigue, fever, night sweats, weight gain or weight loss Psychological ROS: negative for - behavioral disorder, hallucinations, memory difficulties, mood swings or suicidal ideation Ophthalmic ROS: negative for - blurry vision, double vision, eye pain or loss of vision ENT ROS: negative for - epistaxis, nasal discharge, oral lesions, sore throat, tinnitus or vertigo Allergy and Immunology ROS: negative for - hives or  itchy/watery eyes Hematological and Lymphatic ROS: negative for - bleeding problems, bruising or swollen lymph nodes Endocrine ROS: negative for - galactorrhea, hair pattern changes, polydipsia/polyuria or temperature intolerance Respiratory ROS: negative for - cough, hemoptysis, shortness of breath or wheezing Cardiovascular ROS: negative for - chest pain, dyspnea on exertion, edema or irregular heartbeat Gastrointestinal ROS: negative for - abdominal pain, diarrhea, hematemesis, nausea/vomiting or stool incontinence Genito-Urinary ROS: negative for - dysuria, hematuria, incontinence or urinary frequency/urgency Musculoskeletal ROS: Positive for -  muscular weakness Neurological ROS: as noted in HPI Dermatological ROS: negative for rash and skin lesion changes  Exam: Current vital signs: BP 118/67 (BP Location: Right Arm)   Pulse 69   Temp 98.6 F (37 C) (Oral) Comment: Patient transferred from [facility] to 3W06  Resp 18   Ht _0  (1.702 m)   Wt 62.3 kg   SpO2 100%   BMI 21.51 kg/m  Vital signs in last 24 hours: Temp:  [97.5 F (36.4 C)-98.6 F (37 C)] 98.6 F (37 C) (05/20 1428) Pulse Rate:  [54-100] 69 (05/20 1428) Resp:  [15-27] 18 (05/20 1428) BP: (113-164)/(61-103) 118/67 (05/20 1428) SpO2:  [97 %-100 %] 100 % (05/20 1428) Weight:  [62.3 kg-64.5 kg] 62.3 kg (05/20 0820)   Constitutional: Appears well-developed and well-nourished.  Psych: Affect appropriate to situation Eyes: No scleral injection HENT: No OP obstrucion Head: Normocephalic.  Cardiovascular: Normal rate and regular rhythm.  Respiratory: Effort normal, non-labored breathing GI: Soft.  No distension. There is no tenderness.  Skin: WDI  Neuro: Mental Status: Patient is awake, alert, oriented to person, place, month, year, and situation.  Shows no dysarthria or aphasia.  Able to name, repeat and comprehend.  Gives a good clear coherent history.  Follows all commands Cranial Nerves: II: Visual Fields are  full.  III,IV, VI: Right eye is prosthetic.  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  Left pupil 2 mm and reactive extraocular motions intact V: Facial sensation is symmetric to temperature VII: Right nasolabial fold decrease VIII: hearing is intact to voice X: Palat elevates symmetrically XI: Shoulder shrug is symmetric. XII: tongue is midline without atrophy or fasciculations.  Motor: Tone is normal. Bulk is normal. 5/5 strength was present in all four extremities.  Sensory: Sensation is symmetric to light touch and temperature in the arms and legs. Deep Tendon Reflexes: 2+ and symmetric in the biceps brisk 2+ knee jerk Plantars: Toes are downgoing bilaterally.  Cerebellar: FNF and HKS are intact bilaterally  Labs I have reviewed labs in epic and the results pertinent to this consultation are:   CBC    Component Value Date/Time   WBC 5.3 12/29/2019 2003   RBC 4.56 12/29/2019 2003   HGB 14.6 12/29/2019 2003   HCT 43.8 12/29/2019 2003   PLT 239 12/29/2019 2003   MCV 96.1 12/29/2019 2003   MCH 32.0 12/29/2019 2003   MCHC 33.3 12/29/2019 2003   RDW 12.7 12/29/2019 2003  CMP     Component Value Date/Time   NA 139 12/30/2019 0512   K 3.4 (L) 12/30/2019 0512   CL 105 12/30/2019 0512   CO2 25 12/30/2019 0512   GLUCOSE 102 (H) 12/30/2019 0512   BUN 15 12/30/2019 0512   CREATININE 0.64 12/30/2019 0512   CALCIUM 8.0 (L) 12/30/2019 0512   PROT 5.5 (L) 12/30/2019 0512   ALBUMIN 3.4 (L) 12/30/2019 0512   AST 14 (L) 12/30/2019 0512   ALT 13 12/30/2019 0512   ALKPHOS 56 12/30/2019 0512   BILITOT 0.9 12/30/2019 0512   GFRNONAA >60 12/30/2019 0512   GFRAA >60 12/30/2019 0512    Lipid Panel     Component Value Date/Time   CHOL 169 12/30/2019 0412   TRIG 35 12/30/2019 0412   HDL 57 12/30/2019 0412   CHOLHDL 3.0 12/30/2019 0412   VLDL 7 12/30/2019 0412   LDLCALC 105 (H) 12/30/2019 0412     Imaging I have reviewed the images obtained:  CT-scan of the brain-no  definite CT evidence of acute intracranial abnormality.  Atrophy and presumed chronic small vessel ischemic change of the white matter.  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  Etta Quill PA-C [facility] [phone]  M-F  (9:00 am- 5:00 PM)  12/30/2019, 4:59 PM     Assessment:  This is a 74 year old male who presented to the hospital with a 3-day history of right leg weakness and sensation of foggy head.  MRI confirmed 6 mm acute infarction affecting the left posterior basal ganglia/posterior limb of internal capsule.  On exam today patient shows improved symptoms with both weakness in the leg and "head fogginess".  At present time patient is receiving stroke work-up.  Impression: -Small vessel etiology  acute stroke  Recommend -CT head and neck -Pending final reading of echo -Start patient on ASA 369m daily -Start or continue Atorvastatin 80 mg/other high intensity statin -BP goal: Systolic blood pressure less than 140 -Lipid profile -Telemetry monitoring -Frequent neuro checks -NPO until passes stroke swallow screen -PT/OT # please page stroke NP  Or  PA  Or MD from 8am -4 pm  as this patient from this time will be  followed by the stroke.   You can look them up on [email or web address]  [place] Attending Neurohospitalist Addendum Patient seen and examined with APP/Resident. Agree with the history and physical as documented above. Agree with the plan as documented, which I helped formulate. I have independently reviewed the chart, obtained history, review of systems and examined the patient.I have personally reviewed pertinent head/neck/spine imaging (CT/MRI). Please feel free to call with any questions. --- AAmie Portland MD [facility] Pager: [phone] If 7pm to 7am, please call on call as listed on AMION.

## 2019-12-30 NOTE — Progress Notes (Signed)
  Echocardiogram 2D Echocardiogram has been performed.  Paul Vasquez 12/30/2019, 12:53 PM

## 2019-12-30 NOTE — TOC Initial Note (Signed)
Transition of Care [facility]) - Initial/Assessment Note    Patient Details  Name: Paul Vasquez MRN: [id number] Date of Birth: [birth year]  [facility] of [facility]) CM/SW Contact:    Dessa Phi, RN Phone Number: 12/30/2019, 12:04 PM  Clinical Narrative: d/c plan home,lives alone,recent death of spouse-he has grievance support,& friend support. PT eval await recc.                  Expected Discharge Plan: Home/Self Care Barriers to Discharge: Continued Medical Work up   Patient Goals and CMS Choice Patient states their goals for this hospitalization and ongoing recovery are:: go home   Choice offered to / list presented to : NA  Expected Discharge Plan and Services Expected Discharge Plan: Home/Self Care   Discharge Planning Services: CM Consult   Living arrangements for the past 2 months: Single Family Home                                      Prior Living Arrangements/Services Living arrangements for the past 2 months: Single Family Home Lives with:: Self Patient language and need for interpreter reviewed:: Yes Do you feel safe going back to the place where you live?: Yes      Need for Family Participation in Patient Care: No (Comment) Care giver support system in place?: Yes (comment)   Criminal Activity/Legal Involvement Pertinent to Current Situation/Hospitalization: No - Comment as needed  Activities of Daily Living Home Assistive Devices/Equipment: Other (Comment), Cane (specify quad or straight)(compression stockings) ADL Screening (condition at time of admission) Patient's cognitive ability adequate to safely complete daily activities?: Yes Is the patient deaf or have difficulty hearing?: No Does the patient have difficulty seeing, even when wearing glasses/contacts?: 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) Does the patient have difficulty concentrating, remembering, or making decisions?: No Patient able to express need for assistance with  ADLs?: Yes Does the patient have difficulty dressing or bathing?: No Independently performs ADLs?: Yes (appropriate for developmental age) Does the patient have difficulty walking or climbing stairs?: Yes(secondary to weakness in right leg) Weakness of Legs: Right Weakness of Arms/Hands: None  Permission Sought/Granted Permission sought to share information with : Case Manager Permission granted to share information with : Yes, Verbal Permission Granted  Share Information with NAME: Case Manager           Emotional Assessment Appearance:: Appears stated age Attitude/Demeanor/Rapport: Gracious Affect (typically observed): Accepting Orientation: : Oriented to Self, Oriented to Place, Oriented to  Time, Oriented to Situation Alcohol / Substance Use: Not Applicable Psych Involvement: No (comment)  Admission diagnosis:  Weakness of right lower extremity [R29.898] Numbness of right lower extremity [R20.0] Transient weakness of right lower extremity [R29.898] Patient Active Problem List   Diagnosis Date Noted  . Right leg weakness 12/30/2019  . Transient weakness of right lower extremity 12/30/2019  . 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 Neoplasm of uncertain behavior of skin of face 03/31/2019  . Age-related osteoporosis without current pathological fracture 10/04/2018  . H/O enucleation of right eyeball 05/22/2017  . Actinic keratoses 10/11/2015  . Benign essential hypertension 10/11/2015  . Encounter for general adult medical examination without abnormal findings 10/11/2015  . 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 Malignant neoplasm of prostate ([place]) 10/11/2015  . 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

## 2019-12-30 NOTE — Progress Notes (Signed)
PT Cancellation Note  Patient Details Name: Paul Vasquez MRN: [id number] DOB: July 11, 1946   Cancelled Treatment:    Reason Eval/Treat Not Completed: Medical issues which prohibited therapy, transfer to [facility] for Stroke.   Claretha Cooper 12/30/2019, 2:01 PM [place] Pager [phone] Office [phone]

## 2019-12-31 DIAGNOSIS — I63312 Cerebral infarction due to thrombosis of left middle cerebral artery: Secondary | ICD-10-CM

## 2019-12-31 LAB — BASIC METABOLIC PANEL
Anion gap: 8 (ref 5–15)
BUN: 18 mg/dL (ref 8–23)
CO2: 24 mmol/L (ref 22–32)
Calcium: 8.6 mg/dL — ABNORMAL LOW (ref 8.9–10.3)
Chloride: 108 mmol/L (ref 98–111)
Creatinine, Ser: 0.8 mg/dL (ref 0.61–1.24)
GFR calc Af Amer: >60 mL/min (ref >60)
GFR calc non Af Amer: >60 mL/min (ref >60)
Glucose, Bld: 97 mg/dL (ref 70–99)
Potassium: 3.7 mmol/L (ref 3.5–5.1)
Sodium: 140 mmol/L (ref 135–145)

## 2019-12-31 LAB — CBC WITH DIFFERENTIAL/PLATELET
Abs Immature Granulocytes: 0.02 10*3/uL (ref 0.00–0.07)
Basophils Absolute: 0 10*3/uL (ref 0.0–0.1)
Basophils Relative: 1 %
Eosinophils Absolute: 0.1 10*3/uL (ref 0.0–0.5)
Eosinophils Relative: 3 %
HCT: 40.1 % (ref 39.0–52.0)
Hemoglobin: 13.4 g/dL (ref 13.0–17.0)
Immature Granulocytes: 1 %
Lymphocytes Relative: 23 %
Lymphs Abs: 0.9 10*3/uL (ref 0.7–4.0)
MCH: 32.2 pg (ref 26.0–34.0)
MCHC: 33.4 g/dL (ref 30.0–36.0)
MCV: 96.4 fL (ref 80.0–100.0)
Monocytes Absolute: 0.5 10*3/uL (ref 0.1–1.0)
Monocytes Relative: 12 %
Neutro Abs: 2.5 10*3/uL (ref 1.7–7.7)
Neutrophils Relative %: 60 %
Platelets: 210 10*3/uL (ref 150–400)
RBC: 4.16 MIL/uL — ABNORMAL LOW (ref 4.22–5.81)
RDW: 12.8 % (ref 11.5–15.5)
WBC: 4 10*3/uL (ref 4.0–10.5)
nRBC: 0 % (ref 0.0–0.2)

## 2019-12-31 LAB — LIPID PANEL
Cholesterol: 175 mg/dL (ref 0–200)
HDL: 62 mg/dL (ref >40)
LDL Cholesterol: 103 mg/dL — ABNORMAL HIGH (ref 0–99)
Total CHOL/HDL Ratio: 2.8 RATIO
Triglycerides: 51 mg/dL (ref <150)
VLDL: 10 mg/dL (ref 0–40)

## 2019-12-31 MED ORDER — ATORVASTATIN CALCIUM 40 MG PO TABS: 40.0000 mg | ORAL_TABLET | Freq: Every day | ORAL | 0 refills | Status: DC

## 2019-12-31 MED ORDER — ASPIRIN 81 MG PO TBEC: 81.0000 mg | DELAYED_RELEASE_TABLET | Freq: Every day | ORAL | 0 refills | Status: AC

## 2019-12-31 MED ORDER — CLOPIDOGREL BISULFATE 75 MG PO TABS
75.0000 mg | ORAL_TABLET | Freq: Every day | ORAL | Status: DC
  Administered 2019-12-31: 75 mg via ORAL
  Filled 2019-12-31: qty 1

## 2019-12-31 MED ORDER — CLOPIDOGREL BISULFATE 75 MG PO TABS: 75.0000 mg | ORAL_TABLET | Freq: Every day | ORAL | 0 refills | Status: AC

## 2019-12-31 MED FILL — ATORVASTATIN CALCIUM 40 MG: 40 | 90 days supply | Qty: 90 | Fill #0

## 2019-12-31 MED FILL — CLOPIDOGREL 75 MG TABLET: 75 | 21 days supply | Qty: 21 | Fill #0

## 2019-12-31 MED FILL — ASPIRIN LOW DOSE 81 MG TBEC: 81 | 90 days supply | Qty: 90 | Fill #0

## 2019-12-31 NOTE — Discharge Summary (Signed)
Physician Discharge Summary  Paul Vasquez [id number] DOB: 24-Dec-1945 DOA: 12/29/2019  PCP: [name], MD  Admit date: 12/29/2019 Discharge date: 12/31/2019  Admitted From: Home Discharge disposition: Home   Code Status: Full Code  Diet Recommendation: Cardiac diet  Recommendations for Outpatient Follow-Up:   1. Follow-up with neurology as an outpatient 2. 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 Patient presented to the ED on 5/19 for the evaluation of right lower extremity weakness, numbness and tingling of the right arm.  On 5/18, patient went to bed in his usual state of health.  He got up around 2 AM to go to bathroom and noted that his gait was unsteady, he bumped into the door frame.  Next morning he also realized that he had weakness in his right leg.  He felt foggy in his head. He felt off while driving his car.  He also noted some tingling in his right hand from his wrist down but this dissipated in a few minutes however it did return.  He then presented to the ED after several hours of symptom onset.  In the ED, he was hemodynamically stable. CT head did not show any acute intracranial abnormalities. MRI of the brain showed6 mm acute infarction affecting the left posterior basal ganglia/posterior limb internal capsule. Neurology was consulted.  Patient was admitted under hospitalist service for stroke work-up.   Hospital Course:  Acute left-sided stroke -Patient with right leg weakness, out of TPA window at  presentation. -MRI of the brain showed6 mm acute infarction affecting the left posterior basal ganglia/posterior limb internal capsule. -CT angio of head and neck negative for any large vessel occlusion. -Lipid panel with LDL 62, LDL 103,  -A1c 6.1 -TSH normal -Echo with EF 60 to 123456, grade 1 diastolic dysfunction -Neurology consult appreciated. -Patient has been started on aspirin 81mg  daily and Plavix 75 mg daily along with Lipitor 40 mg daily.  Per neurology, after 3 weeks of DAPT, patient can stop Plavix and continue aspirin 81 mg daily only. -PT/OT evaluation treated.  Home health PT recommended.  History of hypertension -I do not see any blood pressure medicine in his home list. -Blood pressure is currently stable without meds.  Continue to monitor at home and follow-up with primary care provider.  Prostate cancer Status post prostatectomy, radiation, on Lupron shots Follows with Dr. Alinda Money, urology CT showed small focus of sclerosis in left superior lateral orbital rim Indeterminate for metastatic focus given history of prostate CA. Monitor as an outpatient.  Hypokalemia:Supplemented with potassium.  Mobility:  PT/OT eval obtained. Code Status:Full code Stable for discharge to home today with home PT  Subjective:  Patient was seen and examined this morning.  Pleasant elderly Caucasian male.  Not in distress.  Symptoms are improving. Chart reviewed No fever, heart rate mostly in 60s with dips down to 50s, blood pressure normal range, breathing comfortably on room air Lipid panel with LDL 62, LDL 103, A1c 6.1  Discharge Exam:   Vitals:   12/30/19 2350 12/31/19 0324 12/31/19 0800 12/31/19 1210  BP: 139/70 Marland Kitchen)  111/58 (!) 155/74 (!) 141/83  Pulse: (!) 58 (!) 52 67 67  Resp: 17 16 18 18   Temp: 98.4 F (36.9 C) 97.8 F (36.6 C) 98.4 F (36.9 C) 98.2 F (36.8 C)  TempSrc: Oral  Oral Oral  SpO2: 99% 99% 99% 99%  Weight:      Height:        Body mass index is  21.51 kg/m.  General exam: Appears calm and comfortable.  Skin: No rashes, lesions or ulcers. HEENT: Atraumatic, normocephalic, supple neck, no obvious bleeding Lungs: Clear to auscultation bilaterally CVS: Regular rate and rhythm, no murmur GI/Abd soft, nontender, nondistended, bowel sound present CNS: Alert, awake, oriented x3 Psychiatry: Mood appropriate Extremities: No pedal edema, no calf tenderness  Discharge Instructions:  Wound care: None Discharge Instructions    Diet - low sodium heart healthy   Complete by: As directed    Increase activity slowly   Complete by: As directed      Follow-up Information    [facility] Follow up.   Specialty: Home Health Services Why: The home health agency will contact you for the first home visit. Contact information: [address] [place] [address] [phone]        [name], MD Follow up.   Specialty: Family Medicine Contact information: [place] [address] [phone]          Allergies as of 12/31/2019      Reactions   Amoxicillin Other (See Comments)   un   Sulfamethoxazole Other (See Comments)   un      Medication List    TAKE these medications   aspirin 81 MG EC tablet Take 1 tablet (81 mg total) by mouth daily. Start taking on: Jan 01, 2020   atorvastatin 40 MG tablet Commonly known as: LIPITOR Take 1 tablet (40 mg total) by mouth daily. Start taking on: Jan 01, 2020   calcium-vitamin D 500-200 MG-UNIT tablet Commonly known as: OSCAL WITH D Take 1 tablet by mouth.   clopidogrel 75 MG tablet Commonly known as: PLAVIX Take 1 tablet (75 mg total) by mouth daily for 21 days. Start taking on: Jan 01, 2020   neomycin-polymyxin-hydrocortisone OTIC solution Commonly known as: CORTISPORIN Apply 1-2 drops to toe after soaking twice a day   NON FORMULARY Raw Calcium daily   NON FORMULARY Immune stimulator daily   NON FORMULARY Skeletal  strength daily   NON FORMULARY Growth factor daily   NON FORMULARY Wheat grass daily   SOLUBLE FIBER/PROBIOTICS PO Take by mouth.   vitamin C 500 MG tablet Commonly known as: ASCORBIC ACID Take 500 mg by mouth 2 (two) times daily.     Time coordinating discharge: 35 minutes  The results of significant diagnostics from this hospitalization (including imaging, microbiology, ancillary and laboratory) are listed below for reference.    Procedures and Diagnostic Studies:   CT ANGIO HEAD W OR WO CONTRAST  Result Date: 12/31/2019 CLINICAL DATA:  Follow-up examination for acute stroke. EXAM: CT ANGIOGRAPHY HEAD AND NECK TECHNIQUE: Multidetector CT imaging of the head and neck was performed using the standard protocol during bolus administration of intravenous contrast. Multiplanar CT image reconstructions and MIPs were obtained to evaluate the vascular anatomy. Carotid stenosis measurements (when applicable) are obtained utilizing NASCET criteria, using the distal internal carotid diameter as the denominator. CONTRAST:  43mL OMNIPAQUE IOHEXOL 350 MG/ML SOLN COMPARISON:  Previous CT and MRI from earlier same day. FINDINGS:  CTA NECK FINDINGS Aortic arch: Visualized aortic arch of normal caliber with normal branch pattern. No hemodynamically significant stenosis seen about the origin of the great vessels. Partially visualized subclavian arteries widely patent. Right carotid system: Right common and internal carotid arteries widely patent without stenosis, dissection or occlusion. Left carotid system: Left common and internal carotid arteries widely patent without stenosis, dissection or occlusion. Vertebral arteries: Both vertebral arteries arise from the subclavian arteries. Vertebral arteries widely patent without stenosis, dissection or occlusion. Skeleton: No acute osseous abnormality. No discrete or worrisome osseous lesions. Mild cervical spondylosis present at C5-6 and C6-7. Other neck: No other  acute soft tissue abnormality within the neck. No mass lesion or adenopathy. Upper chest: Visualized upper chest demonstrates no acute finding. Review of the MIP images confirms the above findings CTA HEAD FINDINGS Anterior circulation: Petrous segments widely patent. Mild scattered atheromatous plaque within the left carotid siphon without flow-limiting stenosis. Right ICA widely patent to the terminus. A1 segments widely patent. Normal anterior communicating artery. Anterior cerebral arteries widely patent to their distal aspects without stenosis. No M1 stenosis or occlusion. Normal MCA bifurcations. Distal MCA branches well perfused and symmetric. Posterior circulation: Both vertebral arteries widely patent to the vertebrobasilar junction without stenosis. Neither PICA well visualized. Basilar widely patent to its distal aspect without stenosis. Superior cerebellar and posterior cerebral arteries widely patent bilaterally. Venous sinuses: Patent. Anatomic variants: None significant. Review of the MIP images confirms the above findings IMPRESSION: 1. Negative CTA of the head and neck. No large vessel occlusion, hemodynamically significant stenosis, or other abnormality. 2. Mild for age atheromatous change within the left carotid siphon. No other significant atherosclerotic disease about the major arterial vasculature of the head and neck. Electronically Signed   By: Jeannine Boga M.D.   On: 12/31/2019 00:21   CT HEAD WO CONTRAST  Result Date: 12/30/2019 CLINICAL DATA:  Right lower extremity numbness and weakness EXAM: CT HEAD WITHOUT CONTRAST TECHNIQUE: Contiguous axial images were obtained from the base of the skull through the vertex without intravenous contrast. COMPARISON:  None. FINDINGS: Brain: No acute territorial infarction, hemorrhage or intracranial mass. Scattered hypodense foci within the periventricular, subcortical and deep white matter, most likely chronic small vessel ischemic change.  Ventricles are nonenlarged. Vascular: No hyperdense vessels.  No unexpected calcification. Skull: No fracture. Focal sclerotic lesion in the left superolateral orbital rim. Sinuses/Orbits: No acute finding. Mucosal thickening left maxillary sinus. Postsurgical change of left maxillary sinus Other: None IMPRESSION: 1. No definite CT evidence for acute intracranial abnormality. Atrophy and presumed chronic small vessel ischemic change of the white matter 2. Small focus of sclerosis in the left superolateral orbital rim, indeterminate for metastatic focus given history of prostate cancer. Electronically Signed   By: Donavan Foil M.D.   On: 12/30/2019 00:23   CT ANGIO NECK W OR WO CONTRAST  Result Date: 12/31/2019 CLINICAL DATA:  Follow-up examination for acute stroke. EXAM: CT ANGIOGRAPHY HEAD AND NECK TECHNIQUE: Multidetector CT imaging of the head and neck was performed using the standard protocol during bolus administration of intravenous contrast. Multiplanar CT image reconstructions and MIPs were obtained to evaluate the vascular anatomy. Carotid stenosis measurements (when applicable) are obtained utilizing NASCET criteria, using the distal internal carotid diameter as the denominator. CONTRAST:  53mL OMNIPAQUE IOHEXOL 350 MG/ML SOLN COMPARISON:  Previous CT and MRI from earlier same day. FINDINGS: CTA NECK FINDINGS Aortic arch: Visualized aortic arch of normal caliber with normal branch pattern. No hemodynamically significant stenosis seen about  the origin of the great vessels. Partially visualized subclavian arteries widely patent. Right carotid system: Right common and internal carotid arteries widely patent without stenosis, dissection or occlusion. Left carotid system: Left common and internal carotid arteries widely patent without stenosis, dissection or occlusion. Vertebral arteries: Both vertebral arteries arise from the subclavian arteries. Vertebral arteries widely patent without stenosis,  dissection or occlusion. Skeleton: No acute osseous abnormality. No discrete or worrisome osseous lesions. Mild cervical spondylosis present at C5-6 and C6-7. Other neck: No other acute soft tissue abnormality within the neck. No mass lesion or adenopathy. Upper chest: Visualized upper chest demonstrates no acute finding. Review of the MIP images confirms the above findings CTA HEAD FINDINGS Anterior circulation: Petrous segments widely patent. Mild scattered atheromatous plaque within the left carotid siphon without flow-limiting stenosis. Right ICA widely patent to the terminus. A1 segments widely patent. Normal anterior communicating artery. Anterior cerebral arteries widely patent to their distal aspects without stenosis. No M1 stenosis or occlusion. Normal MCA bifurcations. Distal MCA branches well perfused and symmetric. Posterior circulation: Both vertebral arteries widely patent to the vertebrobasilar junction without stenosis. Neither PICA well visualized. Basilar widely patent to its distal aspect without stenosis. Superior cerebellar and posterior cerebral arteries widely patent bilaterally. Venous sinuses: Patent. Anatomic variants: None significant. Review of the MIP images confirms the above findings IMPRESSION: 1. Negative CTA of the head and neck. No large vessel occlusion, hemodynamically significant stenosis, or other abnormality. 2. Mild for age atheromatous change within the left carotid siphon. No other significant atherosclerotic disease about the major arterial vasculature of the head and neck. Electronically Signed   By: Jeannine Boga M.D.   On: 12/31/2019 00:21   MR Brain W and Wo Contrast  Result Date: 12/30/2019 CLINICAL DATA:  Right lower extremity weakness and numbness. Intermittent right hand numbness. History of prostate cancer and melanoma. EXAM: MRI HEAD WITHOUT AND WITH CONTRAST TECHNIQUE: Multiplanar, multiecho pulse sequences of the brain and surrounding structures were  obtained without and with intravenous contrast. CONTRAST:  22mL GADAVIST GADOBUTROL 1 MMOL/ML IV SOLN COMPARISON:  Head CT same day FINDINGS: Brain: Diffusion imaging shows a 6 mm acute infarction in the left posterior basal ganglia or posterior limb internal capsule. No other acute infarction. Chronic small-vessel ischemic changes affect the pons. Extensive chronic small-vessel ischemic changes are present elsewhere throughout the cerebral hemispheric white matter. No cortical or large vessel territory infarction. No mass lesion, recent hemorrhage, hydrocephalus or extra-axial collection. Few foci of hemosiderin deposition related to some of the old small vessel infarctions. Vascular: Major vessels at the base of the brain show flow. Skull and upper cervical spine: Negative. No notable finding related to the sclerotic focus in the left supraorbital region described by CT. This remains indeterminate. Sinuses/Orbits: Sinuses are clear. 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 Other: None IMPRESSION: 6 mm acute infarction affecting the left posterior basal ganglia/posterior limb internal capsule. No mass effect or hemorrhage in that location. Pronounced chronic small-vessel ischemic changes elsewhere throughout the cerebral hemispheric white matter pons. Electronically Signed   By: Nelson Chimes M.D.   On: 12/30/2019 08:31   ECHOCARDIOGRAM COMPLETE  Result Date: 12/30/2019    ECHOCARDIOGRAM REPORT   Patient Name:   MIKLOS STEELY Date of Exam: 12/30/2019 Medical Rec #:  [id number]             Height:       67.0 in Accession #:    [id number]  Weight:       137.3 lb Date of Birth:  08/17/1945              BSA:          1.724 m Patient Age:    57 years              BP:           152/87 mmHg Patient Gender: M                     HR:           88 bpm. Exam Location:  Inpatient Procedure: 2D Echo Indications:   TIA  History:       Patient has no prior history of Echocardiogram examinations.                 Cancer.  Sonographer:   Jannett Celestine RDCS (AE) Referring      [phone] [facility] MUJTABA Phys:  Sonographer Comments: Suboptimal parasternal window and Technically difficult study due to poor echo windows. limited patient mobility IMPRESSIONS  1. Left ventricular ejection fraction, by estimation, is 60 to 65%. The left ventricle has normal function. Left ventricular endocardial border not optimally defined to evaluate regional wall motion. Left ventricular diastolic parameters are consistent with Grade I diastolic dysfunction (impaired relaxation).  2. Right ventricular systolic function is normal. The right ventricular size is mildly enlarged.  3. The mitral valve is normal in structure. No evidence of mitral valve regurgitation. No evidence of mitral stenosis.  4. The aortic valve is normal in structure. Aortic valve regurgitation is not visualized. No aortic stenosis is present.  5. The inferior vena cava is normal in size with greater than 50% respiratory variability, suggesting right atrial pressure of 3 mmHg. Conclusion(s)/Recommendation(s): No intracardiac source of embolism detected on this transthoracic study. A transesophageal echocardiogram is recommended to exclude cardiac source of embolism if clinically indicated. FINDINGS  Left Ventricle: Left ventricular ejection fraction, by estimation, is 60 to 65%. The left ventricle has normal function. Left ventricular endocardial border not optimally defined to evaluate regional wall motion. The left ventricular internal cavity size was normal in size. There is no left ventricular hypertrophy. Left ventricular diastolic parameters are consistent with Grade I diastolic dysfunction (impaired relaxation). Right Ventricle: The right ventricular size is mildly enlarged. No increase in right ventricular wall thickness. Right ventricular systolic function is normal. Left Atrium: Left atrial size was not well visualized. Right Atrium: Right atrial size was not  well visualized. Pericardium: There is no evidence of pericardial effusion. Mitral Valve: The mitral valve is normal in structure. Normal mobility of the mitral valve leaflets. No evidence of mitral valve regurgitation. No evidence of mitral valve stenosis. Tricuspid Valve: The tricuspid valve is normal in structure. Tricuspid valve regurgitation is not demonstrated. No evidence of tricuspid stenosis. Aortic Valve: The aortic valve is normal in structure. Aortic valve regurgitation is not visualized. No aortic stenosis is present. Pulmonic Valve: The pulmonic valve was normal in structure. Pulmonic valve regurgitation is not visualized. No evidence of pulmonic stenosis. Aorta: The aortic root is normal in size and structure. Venous: The inferior vena cava is normal in size with greater than 50% respiratory variability, suggesting right atrial pressure of 3 mmHg. IAS/Shunts: The interatrial septum was not well visualized.  LEFT VENTRICLE PLAX 2D LVIDd:         4.76 cm  Diastology LVIDs:  2.75 cm  LV e' lateral:   10.70 cm/s LV PW:         0.93 cm  LV E/e' lateral: 5.3 LV IVS:        0.94 cm  LV e' medial:    7.72 cm/s LVOT diam:     2.10 cm  LV E/e' medial:  7.4 LV SV:         54 LV SV Index:   31 LVOT Area:     3.46 cm  RIGHT VENTRICLE RV S prime:     10.20 cm/s TAPSE (M-mode): 1.3 cm LEFT ATRIUM             Index LA diam:        3.10 cm 1.80 cm/m LA Vol (A2C):   27.9 ml 16.19 ml/m LA Vol (A4C):   26.8 ml 15.55 ml/m LA Biplane Vol: 27.4 ml 15.90 ml/m  AORTIC VALVE LVOT Vmax:   81.30 cm/s LVOT Vmean:  58.500 cm/s LVOT VTI:    0.156 m  AORTA Ao Root diam: 2.70 cm MITRAL VALVE MV Area (PHT): 2.26 cm    SHUNTS MV Decel Time: 335 msec    Systemic VTI:  0.16 m MV E velocity: 57.20 cm/s  Systemic Diam: 2.10 cm MV A velocity: 61.60 cm/s MV E/A ratio:  0.93 Cherlynn Kaiser MD Electronically signed by Cherlynn Kaiser MD Signature Date/Time: 12/30/2019/7:59:14 PM    Final    VAS US CAROTID (at [facility] and [facility]  only)  Result Date: 12/30/2019 Carotid Arterial Duplex Study Indications:       TIA. Risk Factors:      None. Other Factors:     MRI results: acute infarction affecting the left posterior                    basal                    ganglia/posterior limb internal capsule. Comparison Study:  none Performing Technologist: June Leap RDMS, RVT  Examination Guidelines: A complete evaluation includes B-mode imaging, spectral Doppler, color Doppler, and power Doppler as needed of all accessible portions of each vessel. Bilateral testing is considered an integral part of a complete examination. Limited examinations for reoccurring indications may be performed as noted.  Right Carotid Findings: +----------+--------+--------+--------+------------------+------------------+           PSV cm/sEDV cm/sStenosisPlaque DescriptionComments           +----------+--------+--------+--------+------------------+------------------+ CCA Prox  71      12                                                   +----------+--------+--------+--------+------------------+------------------+ CCA Distal76      15                                                   +----------+--------+--------+--------+------------------+------------------+ ICA Prox  81      18      Normal                    intimal thickening +----------+--------+--------+--------+------------------+------------------+ ICA Distal134     28                                                   +----------+--------+--------+--------+------------------+------------------+  ECA       106     9                                                    +----------+--------+--------+--------+------------------+------------------+ +----------+--------+-------+----------------+-------------------+           PSV cm/sEDV cmsDescribe        Arm Pressure (mmHG) +----------+--------+-------+----------------+-------------------+ [id number]             Multiphasic, WNL                    +----------+--------+-------+----------------+-------------------+ +---------+--------+--+--------+--+---------+ VertebralPSV cm/s66EDV cm/s20Antegrade +---------+--------+--+--------+--+---------+  Left Carotid Findings: +----------+--------+--------+--------+------------------+------------------+           PSV cm/sEDV cm/sStenosisPlaque DescriptionComments           +----------+--------+--------+--------+------------------+------------------+ CCA Prox  100     24                                                   +----------+--------+--------+--------+------------------+------------------+ CCA Distal89      25                                                   +----------+--------+--------+--------+------------------+------------------+ ICA Prox  97      25      Normal                    intimal thickening +----------+--------+--------+--------+------------------+------------------+ ICA Distal104     20                                                   +----------+--------+--------+--------+------------------+------------------+ ECA       103     13                                                   +----------+--------+--------+--------+------------------+------------------+ +----------+--------+--------+----------------+-------------------+           PSV cm/sEDV cm/sDescribe        Arm Pressure (mmHG) +----------+--------+--------+----------------+-------------------+ [id number]             Multiphasic, WNL                    +----------+--------+--------+----------------+-------------------+ +---------+--------+--+--------+--+---------+ VertebralPSV cm/s75EDV cm/s25Antegrade +---------+--------+--+--------+--+---------+   Summary: Right Carotid: The extracranial vessels were near-normal with only minimal wall                thickening or plaque. Left Carotid: The extracranial vessels were near-normal with  only minimal wall               thickening or plaque.  *See table(s) above for measurements and observations.     Preliminary      Labs:   Basic Metabolic Panel: Recent Labs  Lab 12/29/19 2003 12/29/19 2003 12/30/19 [id number] 12/31/19  0338  NA 140  --  139 140  K 4.0   < > 3.4* 3.7  CL 102  --  105 108  CO2 27  --  25 24  GLUCOSE 143*  --  102* 97  BUN 20  --  15 18  CREATININE 0.86  --  0.64 0.80  CALCIUM 9.2  --  8.0* 8.6*   < > = values in this interval not displayed.   GFR Estimated Creatinine Clearance: 69.2 mL/min (by C-G formula based on SCr of 0.8 mg/dL). Liver Function Tests: Recent Labs  Lab 12/30/19 0512  AST 14*  ALT 13  ALKPHOS 56  BILITOT 0.9  PROT 5.5*  ALBUMIN 3.4*   No results for input(s): LIPASE, AMYLASE in the last 168 hours. No results for input(s): AMMONIA in the last 168 hours. Coagulation profile Recent Labs  Lab 12/29/19 2336  INR 1.0    CBC: Recent Labs  Lab 12/29/19 2003 12/31/19 0338  WBC 5.3 4.0  NEUTROABS  --  2.5  HGB 14.6 13.4  HCT 43.8 40.1  MCV 96.1 96.4  PLT 239 210   Cardiac Enzymes: No results for input(s): CKTOTAL, CKMB, CKMBINDEX, TROPONINI in the last 168 hours. BNP: Invalid input(s): POCBNP CBG: Recent Labs  Lab 12/29/19 2223  GLUCAP 110*   D-Dimer No results for input(s): DDIMER in the last 72 hours. Hgb A1c Recent Labs    12/30/19 0512  HGBA1C 6.1*   Lipid Profile Recent Labs    12/30/19 0412 12/31/19 0338  CHOL 169 175  HDL 57 62  LDLCALC 105* 103*  TRIG 35 51  CHOLHDL 3.0 2.8   Thyroid function studies Recent Labs    12/30/19 0411  TSH 1.605   Anemia work up No results for input(s): VITAMINB12, FOLATE, FERRITIN, TIBC, IRON, RETICCTPCT in the last 72 hours. Microbiology Recent Results (from the past 240 hour(s))  SARS Coronavirus 2 by RT PCR (hospital order, performed in [facility] hospital lab) Nasopharyngeal Nasopharyngeal Swab     Status: None   Collection Time: 12/30/19  1:00  AM   Specimen: Nasopharyngeal Swab  Result Value Ref Range Status   SARS Coronavirus 2 NEGATIVE NEGATIVE Final    Comment: (NOTE) SARS-CoV-2 target nucleic acids are NOT DETECTED. The SARS-CoV-2 RNA is generally detectable in upper and lower respiratory specimens during the acute phase of infection. The lowest concentration of SARS-CoV-2 viral copies this assay can detect is 250 copies / mL. A negative result does not preclude SARS-CoV-2 infection and should not be used as the sole basis for treatment or other patient management decisions.  A negative result may occur with improper specimen collection / handling, submission of specimen other than nasopharyngeal swab, presence of viral mutation(s) within the areas targeted by this assay, and inadequate number of viral copies (<250 copies / mL). A negative result must be combined with clinical observations, patient history, and epidemiological information. Fact Sheet for Patients:   StrictlyIdeas.no Fact Sheet for Healthcare Providers: BankingDealers.co.za This test is not yet approved or cleared  by the [place] FDA and has been authorized for detection and/or diagnosis of SARS-CoV-2 by FDA under an Emergency Use Authorization (EUA).  This EUA will remain in effect (meaning this test can be used) for the duration of the COVID-19 declaration under Section 564(b)(1) of the Act, 21 U.S.C. section 360bbb-3(b)(1), unless the authorization is terminated or revoked sooner. Performed at [facility], [place] [address], [place], [place] [address]  Please note: You were cared for by a hospitalist during your hospital stay. Once you are discharged, your primary care physician will handle any further medical issues. Please note that NO REFILLS for any discharge medications will be authorized once you are discharged, as it is imperative that you return to your primary care  physician (or establish a relationship with a primary care physician if you do not have one) for your post hospital discharge needs so that they can reassess your need for medications and monitor your lab values.  Signed: Terrilee Croak  [facility] 12/31/2019, 2:28 PM

## 2019-12-31 NOTE — Plan of Care (Signed)

## 2019-12-31 NOTE — TOC Transition Note (Signed)
Transition of Care [facility]) - CM/SW Discharge Note   Patient Details  Name: Donterrius Recine MRN: [id number] Date of Birth: 1946-03-08  Transition of Care [facility]) CM/SW Contact:  Pollie Friar, RN Phone Number: 12/31/2019, 2:26 PM   Clinical Narrative:    Pt provided choice for [facility] services. [facility] and [place] with [facility] accepted the referral.  No DME needs per PT/OT.  Pt asked to speak with CSW and CM has informed them.  Pt asking for meds to be delivered to the room prior to d/c and MD updated.  Pt will need transport home when medically ready.      Barriers to Discharge: Continued Medical Work up   Patient Goals and CMS Choice Patient states their goals for this hospitalization and ongoing recovery are:: go home CMS Medicare.gov Compare Post Acute Care list provided to:: Patient Choice offered to / list presented to : Patient  Discharge Placement                       Discharge Plan and Services   Discharge Planning Services: CM Consult Post Acute Care Choice: Home Health                    HH Arranged: RN, PT, Social Work [facility] Agency: [facility] Date [facility] Agency Contacted: 12/31/19   Representative spoke with at [place]: [place] ([place]) Interventions     Readmission Risk Interventions No flowsheet data found.

## 2019-12-31 NOTE — TOC Transition Note (Signed)
Transition of Care [facility]) - CM/SW Discharge Note   Patient Details  Name: Paul Vasquez MRN: [id number] Date of Birth: [birth year]  Transition of Care [facility]) CM/SW Contact:  Pollie Friar, RN Phone Number: 12/31/2019, 2:32 PM   Clinical Narrative:    Pt discharging home once he receives his [facility] from the [facility].  Cab voucher provided to bedside RN for transport home.  HH information on the AVS.   Final next level of care: Home w Home Health Services Barriers to Discharge: Continued Medical Work up   Patient Goals and CMS Choice Patient states their goals for this hospitalization and ongoing recovery are:: go home CMS Medicare.gov Compare Post Acute Care list provided to:: Patient Choice offered to / list presented to : Patient  Discharge Placement                       Discharge Plan and Services   Discharge Planning Services: CM Consult Post Acute Care Choice: Home Health                    HH Arranged: RN, PT, Social Work [facility] Agency: [facility] Date [facility] Agency Contacted: 12/31/19   Representative spoke with at [place]: [place] ([place]) Interventions     Readmission Risk Interventions No flowsheet data found.

## 2019-12-31 NOTE — Progress Notes (Signed)
Chaplain responded to spiritual consult for support of patient who is grieving and in midst of major life transition.  Chaplain initiated relationship of care and concern for patient.  Chaplain sat with patient and simply listened as patient talked, seemingly from a deep need to have an opportunity to connect one-on-one with another person, if only for a little while.  Chaplain available for follow up care if needed.  De Burrs Chaplain Resident

## 2019-12-31 NOTE — Evaluation (Signed)
Physical Therapy Evaluation Patient Details Name: Paul Vasquez MRN: [id number] DOB: 08/21/1945 Today's Date: 12/31/2019   History of Present Illness  Patient is a 74 year old male admitted with right LE weakness, tingling in right hand. MRI reveals acute basal ganglia infarct. 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 Patient agrees to PT assessment. Reports he is still grieving the loss of his wife, and has had a few medical problems lately. He reports he has no children and no family who live in state. He is independent with transfers, bed mobility and ambulation of 300 feet. He does not use AD and had no difficulty observed or unsteadiness noted. He does not require acute PT follow up at this time, however he is encouraged to ambulate while here. He may benefit from HHPT to facilitate recovery back to baseline.     Follow Up Recommendations Home health PT    Equipment Recommendations  None recommended by PT    Recommendations for Other Services       Precautions / Restrictions Precautions Precaution Comments: mod fall Restrictions Weight Bearing Restrictions: No      Mobility  Bed Mobility Overal bed mobility: Independent                Transfers Overall transfer level: Independent                  Ambulation/Gait Ambulation/Gait assistance: Min guard Gait Distance (Feet): 300 Feet Assistive device: None Gait Pattern/deviations: WFL(Within Functional Limits) Gait velocity: decr   General Gait Details: generally steady with ambulation  Stairs            Wheelchair Mobility    Modified Rankin (Stroke Patients Only) Modified Rankin (Stroke Patients Only) Pre-Morbid Rankin Score: No symptoms Modified  Rankin: No significant disability     Balance Overall balance assessment: Independent                                           Pertinent Vitals/Pain Pain Assessment: No/denies pain    Home Living Family/patient expects to be discharged to:: Private residence Living Arrangements: Alone Available Help at Discharge: Friend(s);Available PRN/intermittently Type of Home: House Home Access: Stairs to enter   Entrance Stairs-Number of Steps: 2 Home Layout: Two level;Bed/bath upstairs Home Equipment: None      Prior Function Level of Independence: Independent         Comments: drives     Hand Dominance   Dominant Hand: Right    Extremity/Trunk Assessment   Upper Extremity Assessment Upper Extremity Assessment: Defer to OT evaluation    Lower Extremity Assessment Lower Extremity Assessment: Overall WFL for tasks assessed    Cervical / Trunk Assessment Cervical / Trunk Assessment: Normal  Communication   Communication: No difficulties  Cognition Arousal/Alertness: Awake/alert Behavior During Therapy: WFL for tasks assessed/performed Overall Cognitive Status: Within Functional Limits for tasks assessed                                        General Comments      Exercises       Assessment/Plan    PT Assessment All further PT needs can be met in the next venue of care  PT Problem List Decreased activity tolerance;Decreased mobility       PT Treatment Interventions      PT Goals (Current goals can be found in the Care Plan section)  Acute Rehab PT Goals Patient Stated Goal: to return to full indepedence PT Goal Formulation: With patient Time For Goal Achievement: 01/07/20 Potential to Achieve Goals: Good    Frequency     Barriers to discharge        Co-evaluation               AM-PAC PT "6 Clicks" Mobility  Outcome Measure Help needed turning from your back to your side while in a flat bed without using  bedrails?: None Help needed moving from lying on your back to sitting on the side of a flat bed without using bedrails?: None Help needed moving to and from a bed to a chair (including a wheelchair)?: None Help needed standing up from a chair using your arms (e.g., wheelchair or bedside chair)?: None Help needed to walk in hospital room?: None Help needed climbing 3-5 steps with a railing? : None 6 Click Score: 24    End of Session   Activity Tolerance: Patient tolerated treatment well Patient left: in bed;with call bell/phone within reach Nurse Communication: Mobility status PT Visit Diagnosis: Muscle weakness (generalized) (M62.81)    Time: 1020-1049 PT Time Calculation (min) (ACUTE ONLY): 29 min   Charges:   PT Evaluation $PT Eval Moderate Complexity: 1 Mod PT Treatments $Gait Training: 8-22 mins        Kristyn Conetta, PT, GCS 12/31/19,11:48 AM   

## 2019-12-31 NOTE — Social Work (Signed)
Pt requested to be seen by social work. Pt reported that he wanted to speak to social work because he had feelings of grief associated with the one year anniversary of his wife's passing. CSW provided pt was resources. Pt stated that he has a bereavement  counselor through [place] and a mental health therapist through another agency.  Pt stated that he doesn't feel like he has enough support. CSW encouraged pt to reach out to his church family and his neighbors to help feel the gaps. CSW encouraged pt to continue this conversation with his therapist. Pt was receptive to mental health resources.   Emeterio Reeve, Latanya Presser, [place] Social Worker [phone]

## 2019-12-31 NOTE — Evaluation (Addendum)
Occupational Therapy Evaluation and Discharge Patient Details Name: Paul Vasquez MRN: [id number] DOB: 1946-08-01 Today's Date: 12/31/2019    History of Present Illness Patient is a 74 year old male admitted with right LE weakness, tingling in right hand. MRI reveals acute basal ganglia infarct. 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 Admitted for above and presenting near baseline independent level for self care, mobility in room.  He demonstrates ability to complete shower transfers, grooming at sink, LB dressing with modified independence.  Reports mild dull sensation changes to R hand, but functional. Pt emotional during session due to anniversary of the passing of his spouse coming up, referred to social work.  Patient with no further OT needs identified and OT will sign off, pt agreeable with recommendations.     Follow Up Recommendations  No OT follow up    Equipment Recommendations  None recommended by OT    Recommendations for Other Services Other (comment)(social work )     Precautions / Restrictions Precautions Precaution Comments: mod fall Restrictions Weight Bearing Restrictions: No      Mobility Bed Mobility Overal bed mobility: Independent                Transfers Overall transfer level: Modified independent                    Balance Overall balance assessment: Independent                                         ADL either performed or assessed with clinical judgement   ADL Overall ADL's : Modified independent                                       General ADL Comments: pt demonstrates ability to complete shower transfers, grooming at sink (flossing and brushing teeth at sink), LB  dressing with modified independnece     Vision Baseline Vision/History: Wears glasses Wears Glasses: At all times Patient Visual Report: Other (comment)(R prosthetic eye) Vision Assessment?: Yes Eye Alignment: Within Functional Limits Ocular Range of Motion: Within Functional Limits Alignment/Gaze Preference: Within Defined Limits Tracking/Visual Pursuits: Able to track stimulus in all quads without difficulty Additional Comments: appears [facility]      Perception     Praxis      Pertinent Vitals/Pain Pain Assessment: No/denies pain     Hand Dominance Right   Extremity/Trunk Assessment Upper Extremity Assessment Upper Extremity Assessment: Overall WFL for tasks assessed(pt reports mild dull loss of sesnation in R hand)   Lower Extremity Assessment Lower Extremity Assessment: Defer to PT evaluation   Cervical / Trunk Assessment Cervical / Trunk Assessment: Normal   Communication Communication Communication: No difficulties   Cognition Arousal/Alertness: Awake/alert Behavior During Therapy: WFL for tasks assessed/performed Overall Cognitive Status: Within Functional Limits for tasks assessed                                     General Comments  pt labile upon entry, pt reports emotional from anniversary arriving  of spouses passing     Exercises     Shoulder [place] expects to be discharged to:: Private residence Living Arrangements: Alone Available Help at Discharge: Friend(s);Available PRN/intermittently Type of Home: House Home Access: Stairs to enter Entrance Stairs-Number of Steps: 2   Home Layout: Two level;Bed/bath upstairs Alternate Level Stairs-Number of Steps: flight Alternate Level Stairs-Rails: Right Bathroom Shower/Tub: Occupational psychologist: Standard     Home Equipment: Grab bars - tub/shower      Lives With: Alone    Prior Functioning/Environment Level of Independence:  Independent        Comments: drives        OT Problem List: Impaired sensation      OT Treatment/Interventions:      OT Goals(Current goals can be found in the care plan section) Acute Rehab OT Goals Patient Stated Goal: to return to full indepedence OT Goal Formulation: With patient  OT Frequency:     Barriers to D/C:            Co-evaluation              AM-PAC OT "6 Clicks" Daily Activity     Outcome Measure Help from another person eating meals?: None Help from another person taking care of personal grooming?: None Help from another person toileting, which includes using toliet, bedpan, or urinal?: None Help from another person bathing (including washing, rinsing, drying)?: None Help from another person to put on and taking off regular upper body clothing?: None Help from another person to put on and taking off regular lower body clothing?: None 6 Click Score: 24   End of Session Equipment Utilized During Treatment: Gait belt Nurse Communication: Mobility status  Activity Tolerance: Patient tolerated treatment well Patient left: in bed;with call bell/phone within reach  OT Visit Diagnosis: Other symptoms and signs involving the nervous system (R29.898)                TimeED:[id number] OT Time Calculation (min): 25 min Charges:  OT General Charges $OT Visit: 1 Visit OT Evaluation $OT Eval Low Complexity: 1 Low OT Treatments $Self Care/Home Management : 8-22 mins  Jolaine Artist, OT Acute Rehabilitation Services Pager [phone] Office [phone]    Delight Stare 12/31/2019, 1:45 PM

## 2019-12-31 NOTE — Progress Notes (Signed)
STROKE TEAM PROGRESS NOTE   INTERVAL HISTORY No family at bedside. Pt lying in bed, stated feeling OK but when walk with PT/OT in the hallway, he can feel some mild weakness on the right. His wife passed away one year ago, he still has depressed mood that easily to be seen.   Vitals:   12/30/19 2335 12/30/19 2350 12/31/19 0324 12/31/19 0800  BP:  139/70 (!) 111/58 (!) 155/74  Pulse:  (!) 58 (!) 52 67  Resp:  17 16 18   Temp:  98.4 F (36.9 C) 97.8 F (36.6 C) 98.4 F (36.9 C)  TempSrc:  Oral  Oral  SpO2: 98% 99% 99% 99%  Weight:      Height:        CBC:  Recent Labs  Lab 12/29/19 2003 12/31/19 0338  WBC 5.3 4.0  NEUTROABS  --  2.5  HGB 14.6 13.4  HCT 43.8 40.1  MCV 96.1 96.4  PLT 239 [id number]    Basic Metabolic Panel:  Recent Labs  Lab 12/30/19 0512 12/31/19 0338  NA 139 140  K 3.4* 3.7  CL 105 108  CO2 25 24  GLUCOSE 102* 97  BUN 15 18  CREATININE 0.64 0.80  CALCIUM 8.0* 8.6*   Lipid Panel:     Component Value Date/Time   CHOL 175 12/31/2019 0338   TRIG 51 12/31/2019 0338   HDL 62 12/31/2019 0338   CHOLHDL 2.8 12/31/2019 0338   VLDL 10 12/31/2019 0338   LDLCALC 103 (H) 12/31/2019 0338   HgbA1c:  Lab Results  Component Value Date   HGBA1C 6.1 (H) 12/30/2019   Urine Drug Screen:     Component Value Date/Time   LABOPIA NONE DETECTED 12/29/2019 2336   COCAINSCRNUR NONE DETECTED 12/29/2019 2336   LABBENZ NONE DETECTED 12/29/2019 2336   AMPHETMU NONE DETECTED 12/29/2019 2336   THCU NONE DETECTED 12/29/2019 2336   LABBARB NONE DETECTED 12/29/2019 2336    Alcohol Level     Component Value Date/Time   ETH <10 12/29/2019 2336    IMAGING past 24 hours CT ANGIO HEAD W OR WO CONTRAST  Result Date: 12/31/2019 CLINICAL DATA:  Follow-up examination for acute stroke. EXAM: CT ANGIOGRAPHY HEAD AND NECK TECHNIQUE: Multidetector CT imaging of the head and neck was performed using the standard protocol during bolus administration of intravenous contrast.  Multiplanar CT image reconstructions and MIPs were obtained to evaluate the vascular anatomy. Carotid stenosis measurements (when applicable) are obtained utilizing NASCET criteria, using the distal internal carotid diameter as the denominator. CONTRAST:  33mL OMNIPAQUE IOHEXOL 350 MG/ML SOLN COMPARISON:  Previous CT and MRI from earlier same day. FINDINGS: CTA NECK FINDINGS Aortic arch: Visualized aortic arch of normal caliber with normal branch pattern. No hemodynamically significant stenosis seen about the origin of the great vessels. Partially visualized subclavian arteries widely patent. Right carotid system: Right common and internal carotid arteries widely patent without stenosis, dissection or occlusion. Left carotid system: Left common and internal carotid arteries widely patent without stenosis, dissection or occlusion. Vertebral arteries: Both vertebral arteries arise from the subclavian arteries. Vertebral arteries widely patent without stenosis, dissection or occlusion. Skeleton: No acute osseous abnormality. No discrete or worrisome osseous lesions. Mild cervical spondylosis present at C5-6 and C6-7. Other neck: No other acute soft tissue abnormality within the neck. No mass lesion or adenopathy. Upper chest: Visualized upper chest demonstrates no acute finding. Review of the MIP images confirms the above findings CTA HEAD FINDINGS Anterior circulation: Petrous segments widely patent.  Mild scattered atheromatous plaque within the left carotid siphon without flow-limiting stenosis. Right ICA widely patent to the terminus. A1 segments widely patent. Normal anterior communicating artery. Anterior cerebral arteries widely patent to their distal aspects without stenosis. No M1 stenosis or occlusion. Normal MCA bifurcations. Distal MCA branches well perfused and symmetric. Posterior circulation: Both vertebral arteries widely patent to the vertebrobasilar junction without stenosis. Neither PICA well  visualized. Basilar widely patent to its distal aspect without stenosis. Superior cerebellar and posterior cerebral arteries widely patent bilaterally. Venous sinuses: Patent. Anatomic variants: None significant. Review of the MIP images confirms the above findings IMPRESSION: 1. Negative CTA of the head and neck. No large vessel occlusion, hemodynamically significant stenosis, or other abnormality. 2. Mild for age atheromatous change within the left carotid siphon. No other significant atherosclerotic disease about the major arterial vasculature of the head and neck. Electronically Signed   By: Jeannine Boga M.D.   On: 12/31/2019 00:21   CT ANGIO NECK W OR WO CONTRAST  Result Date: 12/31/2019 CLINICAL DATA:  Follow-up examination for acute stroke. EXAM: CT ANGIOGRAPHY HEAD AND NECK TECHNIQUE: Multidetector CT imaging of the head and neck was performed using the standard protocol during bolus administration of intravenous contrast. Multiplanar CT image reconstructions and MIPs were obtained to evaluate the vascular anatomy. Carotid stenosis measurements (when applicable) are obtained utilizing NASCET criteria, using the distal internal carotid diameter as the denominator. CONTRAST:  58mL OMNIPAQUE IOHEXOL 350 MG/ML SOLN COMPARISON:  Previous CT and MRI from earlier same day. FINDINGS: CTA NECK FINDINGS Aortic arch: Visualized aortic arch of normal caliber with normal branch pattern. No hemodynamically significant stenosis seen about the origin of the great vessels. Partially visualized subclavian arteries widely patent. Right carotid system: Right common and internal carotid arteries widely patent without stenosis, dissection or occlusion. Left carotid system: Left common and internal carotid arteries widely patent without stenosis, dissection or occlusion. Vertebral arteries: Both vertebral arteries arise from the subclavian arteries. Vertebral arteries widely patent without stenosis, dissection or  occlusion. Skeleton: No acute osseous abnormality. No discrete or worrisome osseous lesions. Mild cervical spondylosis present at C5-6 and C6-7. Other neck: No other acute soft tissue abnormality within the neck. No mass lesion or adenopathy. Upper chest: Visualized upper chest demonstrates no acute finding. Review of the MIP images confirms the above findings CTA HEAD FINDINGS Anterior circulation: Petrous segments widely patent. Mild scattered atheromatous plaque within the left carotid siphon without flow-limiting stenosis. Right ICA widely patent to the terminus. A1 segments widely patent. Normal anterior communicating artery. Anterior cerebral arteries widely patent to their distal aspects without stenosis. No M1 stenosis or occlusion. Normal MCA bifurcations. Distal MCA branches well perfused and symmetric. Posterior circulation: Both vertebral arteries widely patent to the vertebrobasilar junction without stenosis. Neither PICA well visualized. Basilar widely patent to its distal aspect without stenosis. Superior cerebellar and posterior cerebral arteries widely patent bilaterally. Venous sinuses: Patent. Anatomic variants: None significant. Review of the MIP images confirms the above findings IMPRESSION: 1. Negative CTA of the head and neck. No large vessel occlusion, hemodynamically significant stenosis, or other abnormality. 2. Mild for age atheromatous change within the left carotid siphon. No other significant atherosclerotic disease about the major arterial vasculature of the head and neck. Electronically Signed   By: Jeannine Boga M.D.   On: 12/31/2019 00:21   ECHOCARDIOGRAM COMPLETE  Result Date: 12/30/2019    ECHOCARDIOGRAM REPORT   Patient Name:   Paul Vasquez Date of Exam: 12/30/2019 Medical  Rec #:  [id number]             Height:       67.0 in Accession #:    [id number]            Weight:       137.3 lb Date of Birth:  1946/01/31              BSA:          1.724 m Patient Age:     39 years              BP:           152/87 mmHg Patient Gender: M                     HR:           88 bpm. Exam Location:  Inpatient Procedure: 2D Echo Indications:   TIA  History:       Patient has no prior history of Echocardiogram examinations.                Cancer.  Sonographer:   Jannett Celestine RDCS (AE) Referring      [phone] [facility] MUJTABA Phys:  Sonographer Comments: Suboptimal parasternal window and Technically difficult study due to poor echo windows. limited patient mobility IMPRESSIONS  1. Left ventricular ejection fraction, by estimation, is 60 to 65%. The left ventricle has normal function. Left ventricular endocardial border not optimally defined to evaluate regional wall motion. Left ventricular diastolic parameters are consistent with Grade I diastolic dysfunction (impaired relaxation).  2. Right ventricular systolic function is normal. The right ventricular size is mildly enlarged.  3. The mitral valve is normal in structure. No evidence of mitral valve regurgitation. No evidence of mitral stenosis.  4. The aortic valve is normal in structure. Aortic valve regurgitation is not visualized. No aortic stenosis is present.  5. The inferior vena cava is normal in size with greater than 50% respiratory variability, suggesting right atrial pressure of 3 mmHg. Conclusion(s)/Recommendation(s): No intracardiac source of embolism detected on this transthoracic study. A transesophageal echocardiogram is recommended to exclude cardiac source of embolism if clinically indicated. FINDINGS  Left Ventricle: Left ventricular ejection fraction, by estimation, is 60 to 65%. The left ventricle has normal function. Left ventricular endocardial border not optimally defined to evaluate regional wall motion. The left ventricular internal cavity size was normal in size. There is no left ventricular hypertrophy. Left ventricular diastolic parameters are consistent with Grade I diastolic dysfunction (impaired  relaxation). Right Ventricle: The right ventricular size is mildly enlarged. No increase in right ventricular wall thickness. Right ventricular systolic function is normal. Left Atrium: Left atrial size was not well visualized. Right Atrium: Right atrial size was not well visualized. Pericardium: There is no evidence of pericardial effusion. Mitral Valve: The mitral valve is normal in structure. Normal mobility of the mitral valve leaflets. No evidence of mitral valve regurgitation. No evidence of mitral valve stenosis. Tricuspid Valve: The tricuspid valve is normal in structure. Tricuspid valve regurgitation is not demonstrated. No evidence of tricuspid stenosis. Aortic Valve: The aortic valve is normal in structure. Aortic valve regurgitation is not visualized. No aortic stenosis is present. Pulmonic Valve: The pulmonic valve was normal in structure. Pulmonic valve regurgitation is not visualized. No evidence of pulmonic stenosis. Aorta: The aortic root is normal in size and structure. Venous: The inferior vena cava is normal in size with greater than 50% respiratory variability,  suggesting right atrial pressure of 3 mmHg. IAS/Shunts: The interatrial septum was not well visualized.  LEFT VENTRICLE PLAX 2D LVIDd:         4.76 cm  Diastology LVIDs:         2.75 cm  LV e' lateral:   10.70 cm/s LV PW:         0.93 cm  LV E/e' lateral: 5.3 LV IVS:        0.94 cm  LV e' medial:    7.72 cm/s LVOT diam:     2.10 cm  LV E/e' medial:  7.4 LV SV:         54 LV SV Index:   31 LVOT Area:     3.46 cm  RIGHT VENTRICLE RV S prime:     10.20 cm/s TAPSE (M-mode): 1.3 cm LEFT ATRIUM             Index LA diam:        3.10 cm 1.80 cm/m LA Vol (A2C):   27.9 ml 16.19 ml/m LA Vol (A4C):   26.8 ml 15.55 ml/m LA Biplane Vol: 27.4 ml 15.90 ml/m  AORTIC VALVE LVOT Vmax:   81.30 cm/s LVOT Vmean:  58.500 cm/s LVOT VTI:    0.156 m  AORTA Ao Root diam: 2.70 cm MITRAL VALVE MV Area (PHT): 2.26 cm    SHUNTS MV Decel Time: 335 msec     Systemic VTI:  0.16 m MV E velocity: 57.20 cm/s  Systemic Diam: 2.10 cm MV A velocity: 61.60 cm/s MV E/A ratio:  0.93 Cherlynn Kaiser MD Electronically signed by Cherlynn Kaiser MD Signature Date/Time: 12/30/2019/7:59:14 PM    Final    VAS US CAROTID (at [facility] and [facility] only)  Result Date: 12/30/2019 Carotid Arterial Duplex Study Indications:       TIA. Risk Factors:      None. Other Factors:     MRI results: acute infarction affecting the left posterior                    basal                    ganglia/posterior limb internal capsule. Comparison Study:  none Performing Technologist: June Leap RDMS, RVT  Examination Guidelines: A complete evaluation includes B-mode imaging, spectral Doppler, color Doppler, and power Doppler as needed of all accessible portions of each vessel. Bilateral testing is considered an integral part of a complete examination. Limited examinations for reoccurring indications may be performed as noted.  Right Carotid Findings: +----------+--------+--------+--------+------------------+------------------+           PSV cm/sEDV cm/sStenosisPlaque DescriptionComments           +----------+--------+--------+--------+------------------+------------------+ CCA Prox  71      12                                                   +----------+--------+--------+--------+------------------+------------------+ CCA Distal76      15                                                   +----------+--------+--------+--------+------------------+------------------+ ICA Prox  81      18  Normal                    intimal thickening +----------+--------+--------+--------+------------------+------------------+ ICA Distal134     28                                                   +----------+--------+--------+--------+------------------+------------------+ ECA       106     9                                                     +----------+--------+--------+--------+------------------+------------------+ +----------+--------+-------+----------------+-------------------+           PSV cm/sEDV cmsDescribe        Arm Pressure (mmHG) +----------+--------+-------+----------------+-------------------+ [id number]            Multiphasic, WNL                    +----------+--------+-------+----------------+-------------------+ +---------+--------+--+--------+--+---------+ VertebralPSV cm/s66EDV cm/s20Antegrade +---------+--------+--+--------+--+---------+  Left Carotid Findings: +----------+--------+--------+--------+------------------+------------------+           PSV cm/sEDV cm/sStenosisPlaque DescriptionComments           +----------+--------+--------+--------+------------------+------------------+ CCA Prox  100     24                                                   +----------+--------+--------+--------+------------------+------------------+ CCA Distal89      25                                                   +----------+--------+--------+--------+------------------+------------------+ ICA Prox  97      25      Normal                    intimal thickening +----------+--------+--------+--------+------------------+------------------+ ICA Distal104     20                                                   +----------+--------+--------+--------+------------------+------------------+ ECA       103     13                                                   +----------+--------+--------+--------+------------------+------------------+ +----------+--------+--------+----------------+-------------------+           PSV cm/sEDV cm/sDescribe        Arm Pressure (mmHG) +----------+--------+--------+----------------+-------------------+ [id number]             Multiphasic, WNL                    +----------+--------+--------+----------------+-------------------+  +---------+--------+--+--------+--+---------+ VertebralPSV cm/s75EDV cm/s25Antegrade +---------+--------+--+--------+--+---------+   Summary: Right Carotid: The extracranial vessels were near-normal with only minimal wall  thickening or plaque. Left Carotid: The extracranial vessels were near-normal with only minimal wall               thickening or plaque.  *See table(s) above for measurements and observations.     Preliminary     PHYSICAL EXAM  Temp:  [97.8 F (36.6 C)-99.1 F (37.3 C)] 98.2 F (36.8 C) (05/21 1210) Pulse Rate:  [52-69] 67 (05/21 1210) Resp:  [16-20] 18 (05/21 1210) BP: (111-155)/(58-83) 141/83 (05/21 1210) SpO2:  [98 %-100 %] 99 % (05/21 1210)  General - Well nourished, well developed, in no apparent distress, mildly depressed mood.  Ophthalmologic - fundi not visualized due to noncooperation.  Cardiovascular - Regular rhythm and rate.  Mental Status -  Level of arousal and orientation to time, place, and person were intact. Language including expression, naming, repetition, comprehension was assessed and found intact. Attention span and concentration were normal. Fund of Knowledge was assessed and was intact.  Cranial Nerves II - XII - II - Visual field intact OU. Right eye prosthetic  III, IV, VI - Extraocular movements showed normal movement of left eye. 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   V - Facial sensation intact bilaterally. VII - slight right nasolabial fold flattening. VIII - Hearing & vestibular intact bilaterally. X - Palate elevates symmetrically. XI - Chin turning & shoulder shrug intact bilaterally. XII - Tongue protrusion intact.  Motor Strength - The patient's strength was normal in all extremities and pronator drift was absent.  Bulk was normal and fasciculations were absent.   Motor Tone - Muscle tone was assessed at the neck and appendages and was normal.  Reflexes - The patient's  reflexes were symmetrical in all extremities and he had no pathological reflexes.  Sensory - Light touch, temperature/pinprick were assessed and were symmetrical.    Coordination - The patient had normal movements in the left hand and foot with no ataxia or dysmetria. However, subtle right FTN and HTS dysmetria and slow action. Tremor was absent.  Gait and Station - deferred.   ASSESSMENT/PLAN [name] is a 74 y.o. 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 Small vessel disease. Atrophy. Small sclerosis L superolateral orbital rim, ? mets.   MRI  Acute L posterior basal ganglia / PLIC infarct. Small vessel disease white matter and pons.   CTA head & neck Unremarkable. L ICA siphon mild atherosclerosis .  Carotid Doppler  B ICA 1-39% stenosis, VAs antegrade   2D Echo EF 60-65%. No source of embolus   LDL 103  HgbA1c 6.1  Heparin 5000 units sq tid  for VTE prophylaxis  No antithrombotic prior to admission, now on aspirin 81 mg daily and clopidogrel 75 mg daily. Continue DAPT x 3 weeks then aspirin alone.    Therapy recommendations:  pending   Disposition:  pending   Hypertension  Stable . Permissive hypertension (OK if < 220/120) but gradually normalize in 2-3 days . 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 Please call with questions. Pt will follow up with stroke clinic NP at [facility] in about 4 weeks. Thanks for the consult.  Rosalin Hawking, MD PhD Stroke Neurology 12/31/2019 5:21 PM    To contact Stroke Continuity provider, please refer to [email or web address] After hours, contact General Neurology

## 2019-12-31 NOTE — Progress Notes (Signed)
Pt discharged. AVS Documentation given to patient. All IV's removed and tele disconnected. Belongings sent with pt via wheelchair. Pt was sent in good spirits.

## 2020-01-03 ENCOUNTER — Other Ambulatory Visit: Payer: Self-pay

## 2020-01-03 NOTE — Patient Outreach (Signed)
[facility]) Care Management  01/03/2020  Paul Vasquez Apr 16, 1946 [id number]    EMMI-STROKE-Not on APL RED ON EMMI ALERT Day # 1 Date: 01/02/2020 Red Alert Reason: "Scheduled follow-up appt? No Problems setting up rehab? Yes"   Outreach attempt # 1 to patient. No answer after multiple rings and unable to leave a message.     Plan: RN CM will make outreach attempt to patient within 3-4 business days.  [name], RN,BSN,CCM [place] [facility] Telephonic Care Management Coordinator Direct Phone: [phone] Toll Free: [phone] Fax: [phone]

## 2020-01-03 NOTE — Patient Outreach (Signed)
[facility]) Care Management  01/03/2020  Paul Vasquez Dec 28, 1945 [id number]   EMMI-STROKE-Not on APL RED ON EMMI ALERT Day # 1 Date: 01/02/2020 Red Alert Reason: "Scheduled follow-up appt? No Problems setting up rehab? Yes"   Incoming call from patient returning RN CM call. Patient shares and become tearful and emotional. He is not only dealing with recent stroke but also the one year anniversary of his wife's death. He reports he has a lot of emotions and been emotional lately. Condolences and support given. RN CM assessed for support system in place. He is active with private therapist as well as bereavement counselor through [employer]. RN CM discussed with patient the emotional/mental SE aspects of stroke and normal grieving process. Encouraged him to discuss with PCP as well. He voices that he knows PCP personally and PCP making home visit to see him today and we will talk with him then. He denies any suicidal thoughts. He states that his sister from out of town is visiting and staying with him as well. RN CM reviewed and addressed red alert. Patient voices he was just discharged Friday evening and has not yet had a chance to call and make MD appts. RN CM reviewed d/c paperwork and instructed patient on MDs to follow up with and time frame. He voices understanding. He reports [place] agency called and left message but he has not returned their call. He does not understand why he needs services. RNCM  discussed with pt services ordered(RN,PT,SW) and their role and how they will be able to assist him. He agrees to call them back. Advised patient that they would continue to get automated EMMI-Stroke post discharge calls to assess how they are doing following recent hospitalization and will receive a call from a nurse if any of their responses were abnormal. Patient voiced understanding and was appreciative of f/u call.     Plan: RN CM will close case as patient not  eligible for services.  [name], RN,BSN,CCM [place] [facility] Telephonic Care Management Coordinator Direct Phone: [phone] Toll Free: [phone] Fax: [phone]

## 2020-01-07 ENCOUNTER — Other Ambulatory Visit: Payer: Self-pay

## 2020-01-07 NOTE — Patient Outreach (Signed)
[facility]) Care Management  01/07/2020  Paul Vasquez 1946-06-22 [id number]   EMMI-Stroke-Not on APL  Reason:Patientcalled stating his response to his EMMI Red flag call was done by his family member. He added that as of today, he is feeling worst overall.    Outreach attempt to patient. Spoke with patient. He reports that he had been having some issues with his ankle prior to stroke. He was advised to wear support ankle sock which he has been doing off and on. However, for the past day or so he has been having some tingling in foot and hand as well as swelling. He is unsure if it related to stroke on not. Advised patient to follow up with PCP or neuro MD regarding his sxs. RN CM provided patient with [facility] contact info as he voiced info was left off of his d/c paperwork. He will call office today. Patient did confirm that [facility] services has stated and nurse has been out to see him. SW coming today. Advised patient that he can always contact [facility] agency and speak with nurse as well regarding any issues or concerns he is having. He voiced understanding and denies any other needs or concerns at this time.      Plan: RN CM will keep case closed.    [name], RN,BSN,CCM [place] Management Telephonic Care Management Coordinator Direct Phone: [phone] Toll Free: [phone] Fax: [phone]

## 2020-01-08 DIAGNOSIS — F4321 Adjustment disorder with depressed mood: Secondary | ICD-10-CM | POA: Insufficient documentation

## 2020-01-08 DIAGNOSIS — Z09 Encounter for follow-up examination after completed treatment for conditions other than malignant neoplasm: Secondary | ICD-10-CM | POA: Insufficient documentation

## 2020-01-08 DIAGNOSIS — F329 Major depressive disorder, single episode, unspecified: Secondary | ICD-10-CM | POA: Insufficient documentation

## 2020-01-12 ENCOUNTER — Other Ambulatory Visit: Payer: Self-pay

## 2020-01-12 NOTE — Patient Outreach (Signed)
[facility]) Care Management  01/12/2020  Paul Vasquez 08/05/1946 [id number]    EMMI-STROKE-Not on APL RED ON EMMI ALERT Day # 9 Date: 01/10/2020 Red Alert Reason: "Sad,hopeless,anxious,empty? Yes"     Outreach attempt # 1 to patient. No answer after several rings and unable to leave message.        Plan: RN CM will make outreach attempt to patient within 3-4 business days.   [name], RN,BSN,CCM [place] Management Telephonic Care Management Coordinator Direct Phone: [phone] Toll Free: [phone] Fax: [phone]

## 2020-01-13 ENCOUNTER — Other Ambulatory Visit: Payer: Self-pay

## 2020-01-13 NOTE — Patient Outreach (Signed)
[facility]) Care Management  01/13/2020  Paul Vasquez Aug 06, 1946 [id number]   EMMI-STROKE-Not on APL RED ON EMMI ALERT Day # 9 Date: 01/10/2020 Red Alert Reason: "Sad,hopeless,anxious,empty? Yes"    Outreach attempt #2 to patient. No answer at present.    Plan: RN CM will make final outreach attempt to patient within 3-4 business days.   Paul Montgomery, RN,BSN,CCM [place] Management Telephonic Care Management Coordinator Direct Phone: [phone] Toll Free: [phone] Fax: [phone]

## 2020-01-14 ENCOUNTER — Other Ambulatory Visit: Payer: Self-pay

## 2020-01-14 NOTE — Patient Outreach (Signed)
[facility]) Care Management  01/14/2020  Bodey Frizell March 01, 1946 [id number]   EMMI-STROKE-Not on APL RED ON EMMI ALERT Day #9 Date:01/10/2020 Red Alert Reason:"Sad,hopeless,anxious,empty? Yes"   Outreach attempt #3 to patient. No answer after several rings.     Plan: RN CM will close case at this time.   [name], RN,BSN,CCM [place] Management Telephonic Care Management Coordinator Direct Phone: [phone] Toll Free: [phone] Fax: [phone]

## 2020-01-17 ENCOUNTER — Other Ambulatory Visit: Payer: Self-pay

## 2020-01-17 NOTE — Patient Outreach (Signed)
[facility]) Care Management  01/17/2020  Ermine Spofford October 21, 1945 [id number]    EMMI-Stroke-Not on APL RED ON EMMI ALERT Day # 13 Date: 01/14/2020 Red Alert Reason: "Feeling wore overall? Yes  Went to follow-up appt? I don't know"    Outreach attempt # 1 to patient. No answer at present. RN CM left HIPAA compliant voicemail message along with contact info.      Plan: RN CM will make outreach attempt to patient within 4 business days if no return call.   [name], RN,BSN,CCM [place] Management Telephonic Care Management Coordinator Direct Phone: [phone] Toll Free: [phone] Fax: [phone]

## 2020-01-17 NOTE — Patient Outreach (Signed)
[facility]) Care Management  01/17/2020  Javanni Maring 01-15-1946 [id number]   EMMI-Stroke-Not on APL RED ON EMMI ALERT Day # 13 Date: 01/14/2020 Red Alert Reason: "Feeling wore overall? Yes  Went to follow-up appt? I don't know"    Incoming call from patient returning RN CM call. Spoke with patient and reviewed and addressed red alert. Patient reports he responded that way as he still thinks he remains " a little wobbly." HHPT came out and evaluated patient and did not think there was anything much further that could be done and left patient with some exercises to do. He admits that he has not done them yet as he has "not had the time." Patient has already had home visit by PCP but also voices he has another follow up appt with PCP today and will discuss his gait issue with MD. He denies any other RN CM needs or concerns at this time. Patient has completed post-discharge automated calls.    Plan: RN CM will close case at this time.   [name], RN,BSN,CCM [place] Management Telephonic Care Management Coordinator Direct Phone: [phone] Toll Free: [phone] Fax: [phone]

## 2020-01-24 ENCOUNTER — Ambulatory Visit: Payer: Medicare PPO | Admitting: Adult Health

## 2020-01-24 VITALS — BP 138/78 | HR 78 | Ht 67.0 in | Wt 139.0 lb

## 2020-01-24 DIAGNOSIS — I10 Essential (primary) hypertension: Secondary | ICD-10-CM

## 2020-01-24 DIAGNOSIS — I6381 Other cerebral infarction due to occlusion or stenosis of small artery: Secondary | ICD-10-CM

## 2020-01-24 DIAGNOSIS — I639 Cerebral infarction, unspecified: Secondary | ICD-10-CM

## 2020-01-24 DIAGNOSIS — E785 Hyperlipidemia, unspecified: Secondary | ICD-10-CM

## 2020-01-24 NOTE — Patient Instructions (Signed)
Please continue to monitor your symptoms and if you have an reoccurrence, worsening or new stroke/TIA symptoms, please proceed to emergency room immediately for further evaluation  Continue aspirin 81 mg daily  and atorvastatin 40 mg daily for secondary stroke prevention   Continue to follow up with PCP regarding cholesterol and blood pressure management   Continue to monitor blood pressure at home  Maintain strict control of hypertension with blood pressure goal below 130/90, diabetes with hemoglobin A1c goal below 6.5% and cholesterol with LDL cholesterol (bad cholesterol) goal below 70 mg/dL. I also advised the patient to eat a healthy diet with plenty of whole grains, cereals, fruits and vegetables, exercise regularly and maintain ideal body weight.  Followup in the future with me in 3 months or call earlier if needed       Thank you for coming to see [place] at [facility]. I hope we have been able to provide you high quality care today.  You may receive a patient satisfaction survey over the next few weeks. We would appreciate your feedback and comments so that we may continue to improve ourselves and the health of our patients.

## 2020-01-24 NOTE — Progress Notes (Signed)
[facility] [address] [place]. [place] [address] [id number]       HOSPITAL FOLLOW UP NOTE  Paul Vasquez Date of Birth:  28-May-1946 Medical Record Number:  [id number]   Reason for Referral:  hospital stroke follow up    SUBJECTIVE:   CHIEF COMPLAINT:  Chief Complaint  Patient presents with  . Cerebrovascular Accident    Hospital follow-up    HPI:   Paul Vasquez is a 74 y.o. 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  Initiated DAPT for 3 weeks and aspirin alone.  History of HTN not currently on antihypertensives with BP stable during admission.  LDL 103 and initiated atorvastatin 40 mg daily.  Other stroke risk factors include advanced age and history of EtOH use but no prior stroke history.  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  Evaluated by therapies and recommended discharge home with [facility] PT.   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 Small vessel disease. Atrophy. Small sclerosis L superolateral orbital rim, ? mets.   MRI  Acute L posterior basal ganglia / PLIC infarct. Small vessel disease white matter and pons.   CTA head & neck Unremarkable. L ICA siphon mild atherosclerosis  .  Carotid Doppler  B ICA 1-39% stenosis, VAs antegrade   2D Echo EF 60-65%. No source of embolus   LDL 103  HgbA1c 6.1  Heparin 5000 units sq tid  for VTE prophylaxis  No antithrombotic prior to admission, now on aspirin 81 mg daily and clopidogrel 75 mg daily. Continue DAPT x 3 weeks then aspirin alone.    Therapy recommendations:  home   Disposition:  home   Paul Vasquez is being seen today, 01/24/2020, for hospital follow-up.  He reports initial improvement of right arm numbness and weakness after discharge but started to experience intermittent symptoms of right arm numbness again starting yesterday.  Reports similar symptoms in which he presented with, numbness present from elbow to fingertips.  Denies residual or reoccurring weakness or any other associated stroke symptoms.  Reports numbness typically worse in the morning and improves throughout the day.  He does report occasional band type headache which has been present since stroke without worsening.  He continues to have difficulty with depression after the loss of his wife approximately 1 year ago.  Denies worsening depression post stroke.  Completed 3 weeks DAPT and continues on aspirin alone without bleeding or bruising.  Continues on atorvastatin 40 mg daily without myalgias.  Blood pressure today 138/78.  He questions etiology of his stroke and preventative measures.  No further concerns at this time.     ROS:   14 system review of systems performed and negative with exception of numbness/tingling  PMH:  Past Medical History:  Diagnosis Date  . Melanoma in situ  of ear, right ([place])   . Prostate cancer ([place])   . Skin cancer     PSH:  Past Surgical History:  Procedure Laterality Date  . MOHS SURGERY    . PROSTATECTOMY      Social History:  Social History   Socioeconomic History  . Marital status: Widowed    Spouse name: Not on file  . Number of children: Not on file  . Years of education: Not on file  .  Highest education level: Not on file  Occupational History    Comment: retired  Tobacco Use  . Smoking status: Never Smoker  . Smokeless tobacco: Never Used  Vaping Use  . Vaping Use: Never used  Substance and Sexual Activity  . Alcohol use: Not Currently  . Drug use: Never  . Sexual activity: Not Currently  Other Topics Concern  . Not on file  Social History Narrative  . Not on file   Social Determinants of Health   Financial Resource Strain:   . Difficulty of Paying Living Expenses:   Food Insecurity:   . Worried About Charity fundraiser in the Last Year:   . Arboriculturist in the Last Year:   Transportation Needs:   . Film/video editor (Medical):   Marland Kitchen Lack of Transportation (Non-Medical):   Physical Activity:   . Days of Exercise per Week:   . Minutes of Exercise per Session:   Stress:   . Feeling of Stress :   Social Connections:   . Frequency of Communication with Friends and Family:   . Frequency of Social Gatherings with Friends and Family:   . Attends Religious Services:   . Active Member of Clubs or Organizations:   . Attends Archivist Meetings:   Marland Kitchen Marital Status:   Intimate Partner Violence:   . Fear of Current or Ex-Partner:   . Emotionally Abused:   Marland Kitchen Physically Abused:   . Sexually Abused:     Family History:  Family History  Problem Relation Age of Onset  . Multiple myeloma Father     Medications:   Current Outpatient Medications on File Prior to Visit  Medication Sig Dispense Refill  . aspirin EC 81 MG EC tablet Take 1 tablet (81 mg total) by mouth daily. 90 tablet 0  . atorvastatin (LIPITOR) 40 MG tablet Take 1 tablet (40 mg total) by mouth daily. 90 tablet 0  . calcium-vitamin D (OSCAL WITH D) 500-200 MG-UNIT tablet Take 1 tablet by mouth.    . NON FORMULARY Raw Calcium daily    . NON FORMULARY Immune stimulator daily    . NON FORMULARY Skeletal strength daily    . NON FORMULARY Growth factor daily    . NON FORMULARY  Wheat grass daily    . Probiotic Product (SOLUBLE FIBER/PROBIOTICS PO) Take by mouth.    . vitamin C (ASCORBIC ACID) 500 MG tablet Take 500 mg by mouth 2 (two) times daily.     No current facility-administered medications on file prior to visit.    Allergies:   Allergies  Allergen Reactions  . Amoxicillin Other (See Comments)    un  . Sulfamethoxazole Other (See Comments)    un      OBJECTIVE:  Physical Exam  Vitals:   01/24/20 1309  BP: 138/78  Pulse: 78  Weight: 139 lb (63 kg)  Height: '5\' 7"'$  (1.702 m)   Body mass index is 21.77 kg/m. No exam data present  No flowsheet  data found.   General: well developed, well nourished, elderly Caucasian male, seated, in no evident distress Head: head normocephalic and atraumatic.   Neck: supple with no carotid or supraclavicular bruits Cardiovascular: regular rate and rhythm, no murmurs Musculoskeletal: no deformity Skin:  no rash/petichiae Vascular:  Normal pulses all extremities   Neurologic Exam Mental Status: Awake and fully alert.   Fluent speech and language.  Oriented to place and time. Recent and remote memory intact. Attention span, concentration and fund of knowledge appropriate. Mood and affect appropriate.  Cranial Nerves: Fundoscopic exam reveals sharp disc margins left eye. Pupils equal, briskly reactive to light of left eye.  Right eye prosthetic with right gaze palsy and left gaze incomplete.  Extraocular movements full without nystagmus. Visual fields full to confrontation. Hearing intact. Facial sensation intact.  tongue, palate moves normally and symmetrically.  Slight right nasolabial fold flattening. Motor: Normal bulk and tone. Normal strength in all tested extremity muscles. Sensory.: intact to touch , pinprick , position and vibratory sensation.  Coordination: Rapid alternating movements normal in all extremities. Finger-to-nose and heel-to-shin performed accurately bilaterally. Gait and Station: Arises  from chair without difficulty. Stance is normal. Gait demonstrates normal stride length and balance Reflexes: 1+ and symmetric. Toes downgoing.     NIHSS  0 Modified Rankin  1      ASSESSMENT: Paul Vasquez is a 74 y.o. year old male presented with RLE weakness, RUE numbness, unsteady gait and foggy head on 12/29/2019 with stroke work-up revealing L posterior BG/PLIC infarct likely secondary to small vessel disease source. Vascular risk factors include HTN, HLD, advanced age and history of EtOH use.  Reports initial resolution of all deficits until yesterday started to experience intermittent right arm numbness/tingling in which he initially experienced prior to presenting to ED.  No residual or reoccurring weakness, right leg symptoms or left-sided symptoms     PLAN:  1. L posterior BG/PLIC stroke:  -Recurrent symptoms: Discussion regarding possible recrudescence of stroke symptoms vs new stroke vs extension of stroke.  Discussion regarding obtaining MRI but declines interest for further evaluation at this time.  Discussion regarding importance of proceeding to ED with any worsening, new or reoccurrence of stroke type symptoms.  He verbalized understanding. -Continue aspirin 81 mg daily  and atorvastatin 40 mg daily for secondary stroke prevention.  -Maintain strict control of hypertension with blood pressure goal below 130/90, diabetes with hemoglobin A1c goal below 6.5% and cholesterol with LDL cholesterol (bad cholesterol) goal below 70 mg/dL.  I also advised the patient to eat a healthy diet with plenty of whole grains, cereals, fruits and vegetables, exercise regularly with at least 30 minutes of continuous activity daily and maintain ideal body weight. 2. HTN: Stable.  Continue to follow with PCP for monitoring management 3. HLD: Continue atorvastatin and continue to follow with PCP for prescribing, monitoring and management   Follow up in 3 months or call earlier if  needed   I spent 45 minutes of face-to-face and non-face-to-face time with patient.  This included previsit chart review, lab review, study review, order entry, electronic health record documentation, patient education regarding recent stroke, residual deficits, importance of managing stroke risk factors and answered all questions to patient satisfaction     Frann Rider, [facility]  [facility] [address] [place], [place] [address]  Phone [phone] Fax [phone] Note: This document was prepared with digital dictation and possible smart phrase technology. Any transcriptional errors that result from this process  are unintentional.

## 2020-01-25 ENCOUNTER — Encounter: Payer: Self-pay | Admitting: Adult Health

## 2020-01-25 ENCOUNTER — Telehealth: Payer: Self-pay | Admitting: Internal Medicine

## 2020-01-25 NOTE — Telephone Encounter (Signed)
Scheduled [facility] Palliative visit for 01-28-20 at 12:00.

## 2020-01-28 ENCOUNTER — Other Ambulatory Visit: Payer: Medicare PPO | Admitting: Internal Medicine

## 2020-01-28 ENCOUNTER — Encounter: Payer: Self-pay | Admitting: Internal Medicine

## 2020-01-28 ENCOUNTER — Other Ambulatory Visit: Payer: Self-pay

## 2020-01-28 DIAGNOSIS — Z515 Encounter for palliative care: Secondary | ICD-10-CM

## 2020-01-28 DIAGNOSIS — Z7189 Other specified counseling: Secondary | ICD-10-CM

## 2020-01-28 NOTE — Progress Notes (Signed)
I agree with the above plan 

## 2020-01-28 NOTE — Progress Notes (Signed)
June 18th, 2021 [facility] Palliative Care Consult Note Telephone: [phone]  Fax: [phone]  PATIENT NAME: Paul Vasquez DOB: 1946-03-04 MRN: [id number] [address] [place] [address]  PRIMARY CARE PROVIDER:   [name], MD  REFERRING PROVIDER:  [name], MD [place],  [place] [address]  RESPONSIBLE PARTY:  Grayce Sessions (Sister), [phone] (Mobile)  ASSESSMENT / RECOMMENDATIONS:  1. Advance Care Planning: A. Directives: Patient has DNR in the home. I uploaded copy into [facility] EMR B. Goals of Care: To gain closure concerning the loss of his wife last year.   2. Cognitive / Functional status: Patient is A & O X 3. He's gaining acceptance that he has element of depression d/t unresolved deep grief when his spouse passed June 2020. Understands that his recent stroke may be contributing to depression, as well. Patient consulted his PCP who prescribed Lexapro. Dosage adjustment upwards about 5 days earlier (form 5mg  to 10mg  qd) but patient feels hasn't kicked in. Patient notes recent onset insomnia. Has h/o nocturnal awakenings d/t need to void but was always able to quickly go back to sleep. But now racing thoughts are keeping him awake. Independent in all ADLs. Drives. Mentions decreased appetite; forces himself to eat. He continues to have episodic R forearm fingertip to elbow numbness which waxes and wanes. No change in finger dexterity. Also had tingling band around circumference of upper head, not currently. Feels a little off balance but manageable. No falls. One time turned quickly and became unsteady but he caught himself. We discussed importance of calling 911 symptoms acute stroke. Patient had a recent OV with neurologist and discussed above symptoms.  His current weight is 139 lbs. At a height of 5'7" his BMI is 21.77kg/m2.  -We discussed starting Remeron 7.5mg  po qhs (appetite, insomnia, depression).  Patient initially agreeable but upon consultation with his naturopathic advisor, patient wishes to defer d/t worry about precipitating Serotonin Syndrome. For now, plans to trial Melatonin qhs.   3. Family /Community Supports: No strong support network of family or friends in place. Patient's deceased spouse was the primary source of support/motivation. He had tapped into [employer] community grief counseling last year; didn't feel he would benefit from a redo. We discussed online grief support groups but he's uncertain about tapping into these types of cyberspace forums. Dr. Redmond Pulling referred him for psych f/u at [facility], but the first available appointment isn't till 6 weeks.  Patient is planning on relocating to [facility]. He only just completed some last final details on settling his wife's affairs (grave marker) and not is getting his home ready for sale. It's been very difficult for him to motivate himself to complete his tasks.   4. Follow up Palliative Care: I'll call him next week to schedule a f/u visit.  I spent 60 minutes providing this consultation from noon to 1pm. More than 50% of the time in this consultation was spent coordinating communication.   HISTORY OF PRESENT ILLNESS:  Paul Vasquez is a 74 y.o. 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  Prostate cancer on Lupron. H/O HTN, hypokalemia, adrenal tumor s/p radiation, depression.  -5/19- 12/31/2019 hospitalized with stroke. R sided weakness with unsteady gait. MRI: L posterior basal ganglia/posterior limb internal capsule infarct thought 2/2 small vessel disease. Initial DAPT then  aspirin only. Too much time had gone by for thrombolysis Rx. Incidental small sclerosis L superolateral orbital rim, ? mets. Echo cardiogram: EF 60-65%.   Palliative Care was asked to help address goals of  care.   CODE STATUS: DNR  PPS: 90%  HOSPICE ELIGIBILITY/DIAGNOSIS: TBD  PAST MEDICAL HISTORY:  Past Medical History:  Diagnosis Date  . Melanoma in situ of ear, right ([place])   . Prostate cancer ([place])   . Skin cancer     SOCIAL HX:  Social History   Tobacco Use  . Smoking status: Never Smoker  . Smokeless tobacco: Never Used  Substance Use Topics  . Alcohol use: Not Currently    ALLERGIES:  Allergies  Allergen Reactions  . Amoxicillin Other (See Comments)    un  . Sulfamethoxazole Other (See Comments)    un     PERTINENT MEDICATIONS:  Outpatient Encounter Medications as of 01/28/2020  Medication Sig  . aspirin EC 81 MG EC tablet Take 1 tablet (81 mg total) by mouth daily.  Marland Kitchen atorvastatin (LIPITOR) 40 MG tablet Take 1 tablet (40 mg total) by mouth daily.  Marland Kitchen escitalopram (LEXAPRO) 10 MG tablet Take 10 mg by mouth daily.  . NON FORMULARY Vitamin Code Grow Bone Calcium  . NON FORMULARY Emiliano Dyer Immune Stimulator daily  . NON FORMULARY Nature Sunshine Skeletal strength daily  . NON FORMULARY Growth factor daily  . NON FORMULARY Wheat grass daily  . NON FORMULARY Q-absorb Co-Q10  . NON FORMULARY Apex Energetics Adaptocrine  . NON FORMULARY A[employer] Energet[employer] Gab[place]one  . NON FORMULARY Apex Energet[employer] AD-Pro (bitamin A and D)  . NON FORMULARY Apex Energet[employer] AdrenaCalm S-E  . NON FORMULARY [employer] Prolent  . vitamin C (ASCORBIC ACID) 500 MG tablet Take 500 mg by mouth 2 (two) times daily.  . calcium-vitamin D (OSCAL WITH D) 500-200 MG-UNIT tablet Take 1 tablet by mouth.  . Probiotic Product (SOLUBLE FIBER/PROBIOTICS PO) Take by mouth.   No facility-administered encounter medications on file as of 01/28/2020.    Julianne Handler, NP  [place]

## 2020-02-10 ENCOUNTER — Other Ambulatory Visit: Payer: Medicare PPO | Admitting: Internal Medicine

## 2020-02-10 ENCOUNTER — Other Ambulatory Visit: Payer: Self-pay

## 2020-02-10 ENCOUNTER — Encounter: Payer: Self-pay | Admitting: Internal Medicine

## 2020-02-10 DIAGNOSIS — H3530 Unspecified macular degeneration: Secondary | ICD-10-CM

## 2020-02-10 DIAGNOSIS — Z7189 Other specified counseling: Secondary | ICD-10-CM

## 2020-02-10 DIAGNOSIS — Z515 Encounter for palliative care: Secondary | ICD-10-CM

## 2020-02-10 HISTORY — DX: Unspecified macular degeneration: H35.30

## 2020-02-10 NOTE — Progress Notes (Signed)
July 1st, 2021 [facility] Consult Note Telephone: [phone]  Fax: [phone]   PATIENT NAME: Paul Vasquez DOB: 08-29-1945 MRN: [id number] [address] [place] [address]   PRIMARY CARE PROVIDER:   [name], MD   REFERRING PROVIDER:  [name], MD [place],  [place] [address]   RESPONSIBLE PARTY:  Grayce Sessions (Sister), [phone] (Mobile)   ASSESSMENT / RECOMMENDATIONS:  1. Advance Care Planning: A. Directives: Patient has DNR in the home and uploaded in [place]. Goals of Care: To gain closure concerning the loss of his wife last year.    2. Cognitive / Functional status: Patient is A & O X 3. Ongoing treatment for grieving and depression through bereavement counseling ([facility]; two more sessions will conclude the year) and with his private therapist. His Lexapro is currently at 10 mg/day. He has a good network of friendships within his neighborhood and [place]. He's tentatively planning to move to [facility] this fall. 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  Continues with decreased appetite. Has a consistent diet. We discussed adding Remeron 7.5mg  qhs to his regimen. He will consider bring this up to his PCP. He feels his weight is stable at about 139 lbs. At a height of 5'7" his BMI is 21.77kg/m2.  Insomnia improved with   tab clonazepam qhs (higher dosages made him too somnolent the next day). He trialed Melatonin without much impact.   He has little residual effects form his stroke mid-May. Mentions transient mild bilateral forearm and hand tingling when first awakens. .    3. Follow up Palliative Care: Mon 8/9 21 @ 3pm I spent 60 minutes providing this consultation from 3:30pm to 4:40pm. More than 50% of the time in this consultation was spent coordinating communication.     HISTORY OF PRESENT ILLNESS:  Paul Vasquez is a 74 y.o. 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  Prostate cancer on Lupron. H/O HTN, hypokalemia, adrenal tumor s/p radiation, depression.   -5/19- 12/31/2019 hospitalized with stroke. R sided weakness with unsteady gait. MRI: L posterior basal ganglia/posterior limb internal capsule infarct thought 2/2 small vessel disease. Initial DAPT then aspirin only. Too much time had gone by for thrombolysis Rx. Incidental small sclerosis L superolateral orbital rim,? mets. Echo cardiogram: EF 60-65%.   This is a f/u Palliative Care visit from 01/28/2020.    CODE STATUS: DNR   PPS: 90%   HOSPICE ELIGIBILITY/DIAGNOSIS: No, as prognosis thought to be greater than 6 months. PAST MEDICAL HISTORY:  Past Medical History:  Diagnosis Date  . Melanoma in situ of ear, right ([place])   . Prostate cancer ([place])   . Skin cancer     SOCIAL HX:  Social History   Tobacco Use  . Smoking status: Never Smoker  . Smokeless tobacco: Never Used  Substance Use Topics  . Alcohol use: Not Currently    ALLERGIES:  Allergies  Allergen Reactions  . Amoxicillin Other (See Comments)    un  . Sulfamethoxazole Other (See Comments)    un     PERTINENT MEDICATIONS:  Outpatient Encounter Medications as of 02/10/2020  Medication Sig  . aspirin EC 81 MG EC tablet Take 1 tablet (81  mg total) by mouth daily.  Marland Kitchen atorvastatin (LIPITOR) 40 MG tablet Take 1 tablet (40 mg total) by mouth daily.  . calcium-vitamin D (OSCAL WITH D) 500-200 MG-UNIT tablet Take 1 tablet by mouth.  . escitalopram (LEXAPRO) 10 MG tablet Take 10 mg by mouth daily.  . NON FORMULARY Vitamin Code Grow Bone Calcium  . NON FORMULARY Emiliano Dyer Immune Stimulator daily  . NON FORMULARY Nature Sunshine Skeletal strength daily  . NON FORMULARY Growth factor  daily  . NON FORMULARY Wheat grass daily  . NON FORMULARY Q-absorb Co-Q10  . NON FORMULARY Apex Energetics Adaptocrine  . NON FORMULARY Apex Energetics Gabatone  . NON FORMULARY Apex Energetics AD-Pro (bitamin A and D)  . NON FORMULARY Apex Energetics AdrenaCalm S-E  . NON FORMULARY Sanesco Prolent  . Probiotic Product (SOLUBLE FIBER/PROBIOTICS PO) Take by mouth.  . vitamin C (ASCORBIC ACID) 500 MG tablet Take 500 mg by mouth 2 (two) times daily.   No facility-administered encounter medications on file as of 02/10/2020.     Julianne Handler, NP  [place]

## 2020-02-10 NOTE — Progress Notes (Signed)
     PAST MEDICAL HISTORY:  Past Medical History:  Diagnosis Date  . Melanoma in situ of ear, right ([place] [facility])   . Prostate cancer ([place])   . Skin cancer     SOCIAL HX:  Social History   Tobacco Use  . Smoking status: Never Smoker  . Smokeless tobacco: Never Used  Substance Use Topics  . Alcohol use: Not Currently    ALLERGIES:  Allergies  Allergen Reactions  . Amoxicillin Other (See Comments)    un  . Sulfamethoxazole Other (See Comments)    un     PERTINENT MEDICATIONS:  Outpatient Encounter Medications as of 02/10/2020  Medication Sig  . aspirin EC 81 MG EC tablet Take 1 tablet (81 mg total) by mouth daily.  Marland Kitchen atorvastatin (LIPITOR) 40 MG tablet Take 1 tablet (40 mg total) by mouth daily.  . calcium-vitamin D (OSCAL WITH D) 500-200 MG-UNIT tablet Take 1 tablet by mouth.  . escitalopram (LEXAPRO) 10 MG tablet Take 10 mg by mouth daily.  . NON FORMULARY Vitamin Code Grow Bone Calcium  . NON FORMULARY Emiliano Dyer Immune Stimulator daily  . NON FORMULARY Nature Sunshine Skeletal strength daily  . NON FORMULARY Growth factor daily  . NON FORMULARY Wheat grass daily  . NON FORMULARY Q-absorb Co-Q10  . NON FORMULARY Apex Energetics Adaptocrine  . NON FORMULARY Apex Energetics Gabatone  . NON FORMULARY Apex Energetics AD-Pro (bitamin A and D)  . NON FORMULARY Apex Energetics AdrenaCalm S-E  . NON FORMULARY Sanesco Prolent  . Probiotic Product (SOLUBLE FIBER/PROBIOTICS PO) Take by mouth.  . vitamin C (ASCORBIC ACID) 500 MG tablet Take 500 mg by mouth 2 (two) times daily.   No facility-administered encounter medications on file as of 02/10/2020.    PHYSICAL EXAM:   General: NAD, frail appearing, thin Cardiovascular: regular rate and rhythm Pulmonary: clear ant fields Abdomen: soft, nontender, + bowel sounds GU: no suprapubic tenderness Extremities: no edema, no joint deformities Skin: no rashes Neurological: Weakness but otherwise nonfocal  Julianne Handler, NP

## 2020-02-22 IMAGING — CR DG FOOT COMPLETE 3+V*R*
3 series · 3 of 3 positions shown · non-contrast
Comparison: None.

CLINICAL DATA: Right foot and ankle pain and swelling for 1 week.
No known injury.

EXAM:
RIGHT FOOT COMPLETE - 3+ VIEW

[x foot ap right]
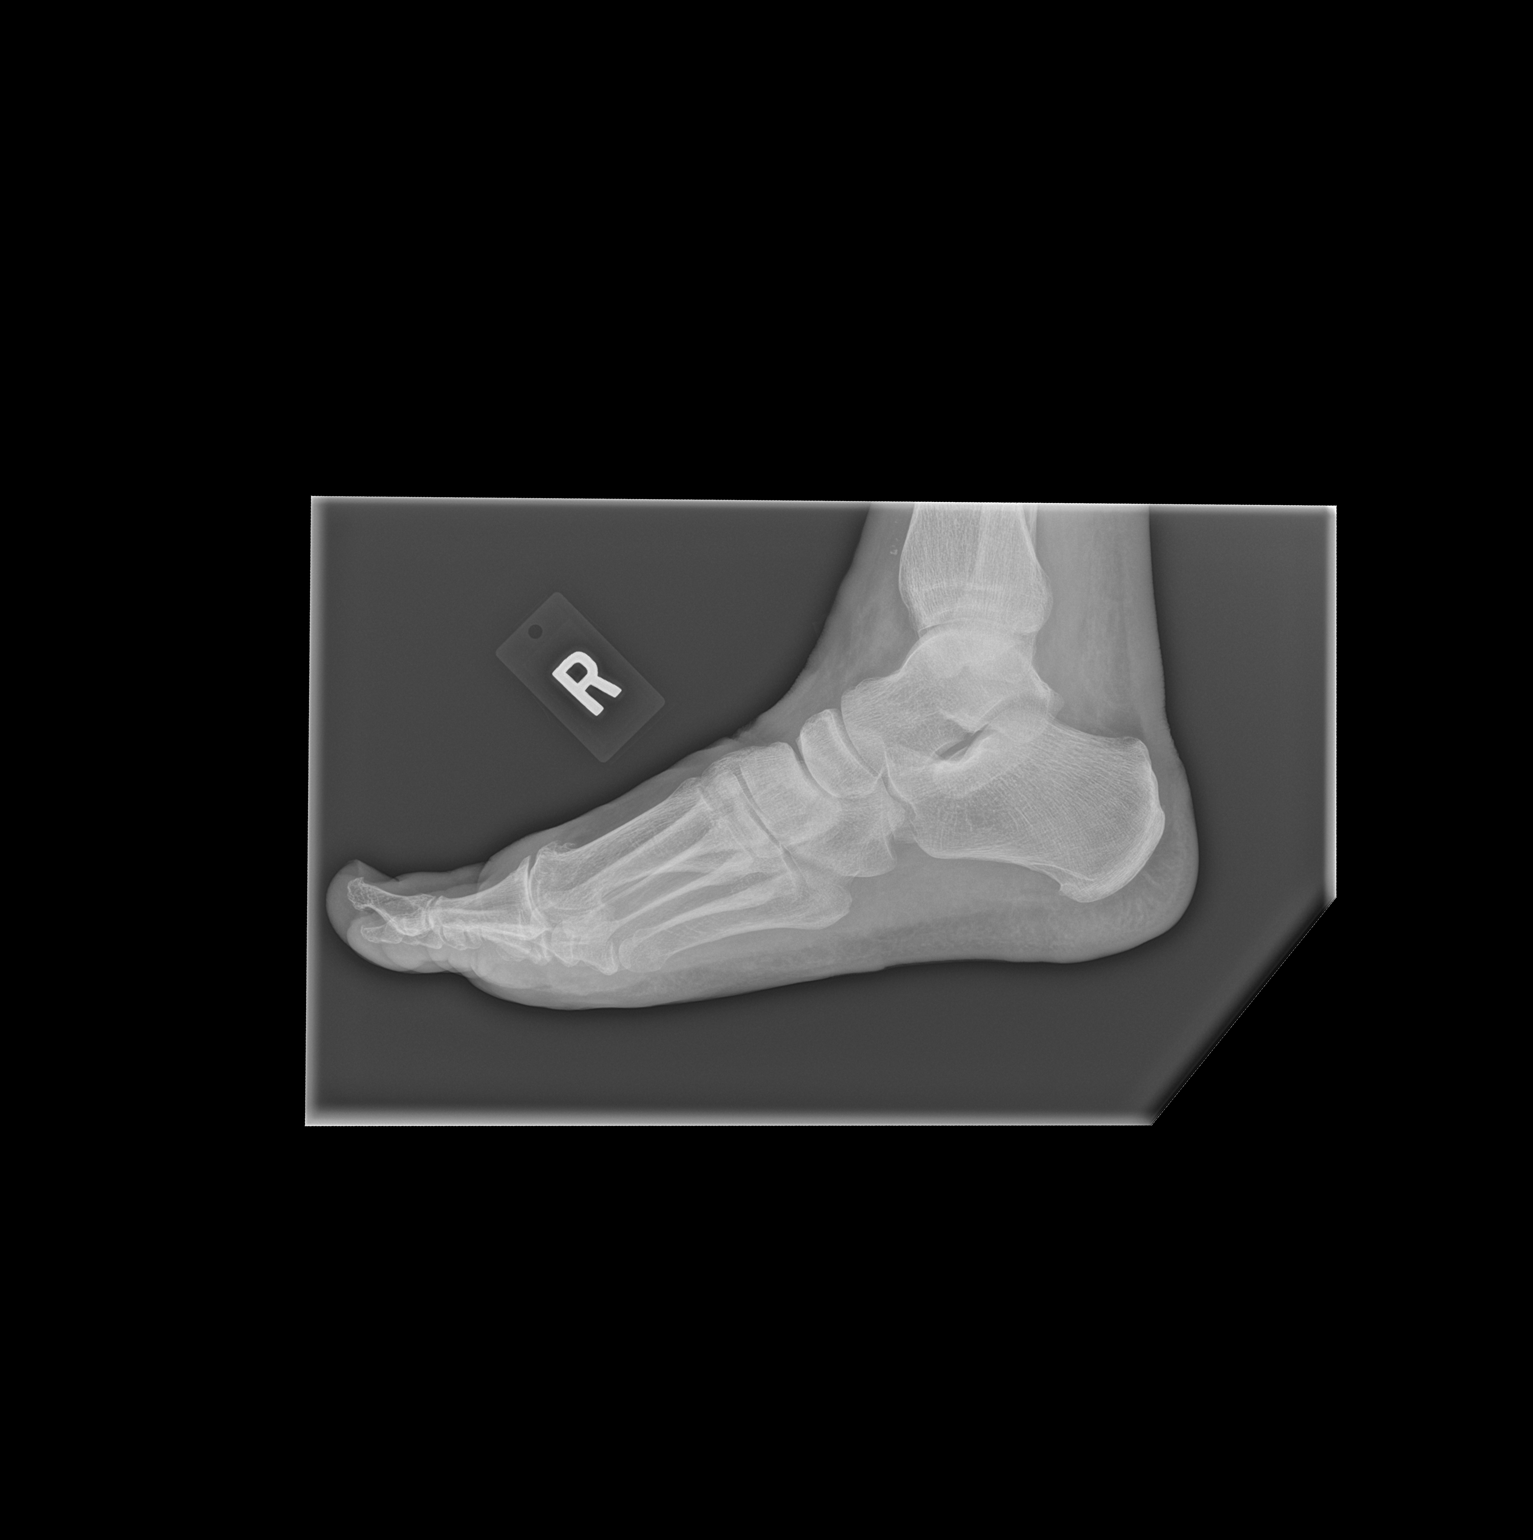

[x foot obl right (1 of 2)]
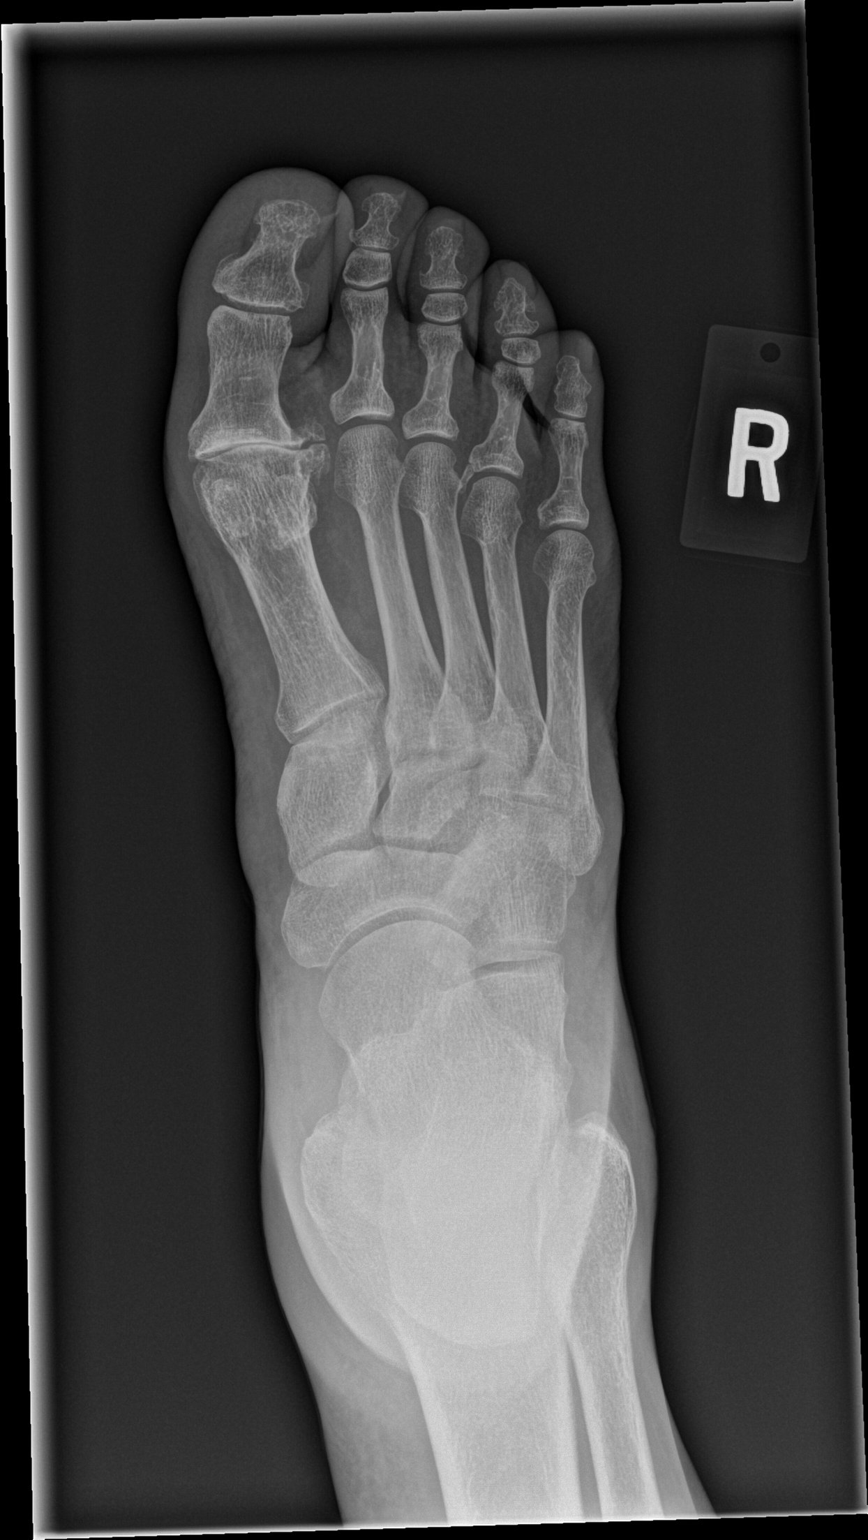

[x foot obl right (2 of 2)]
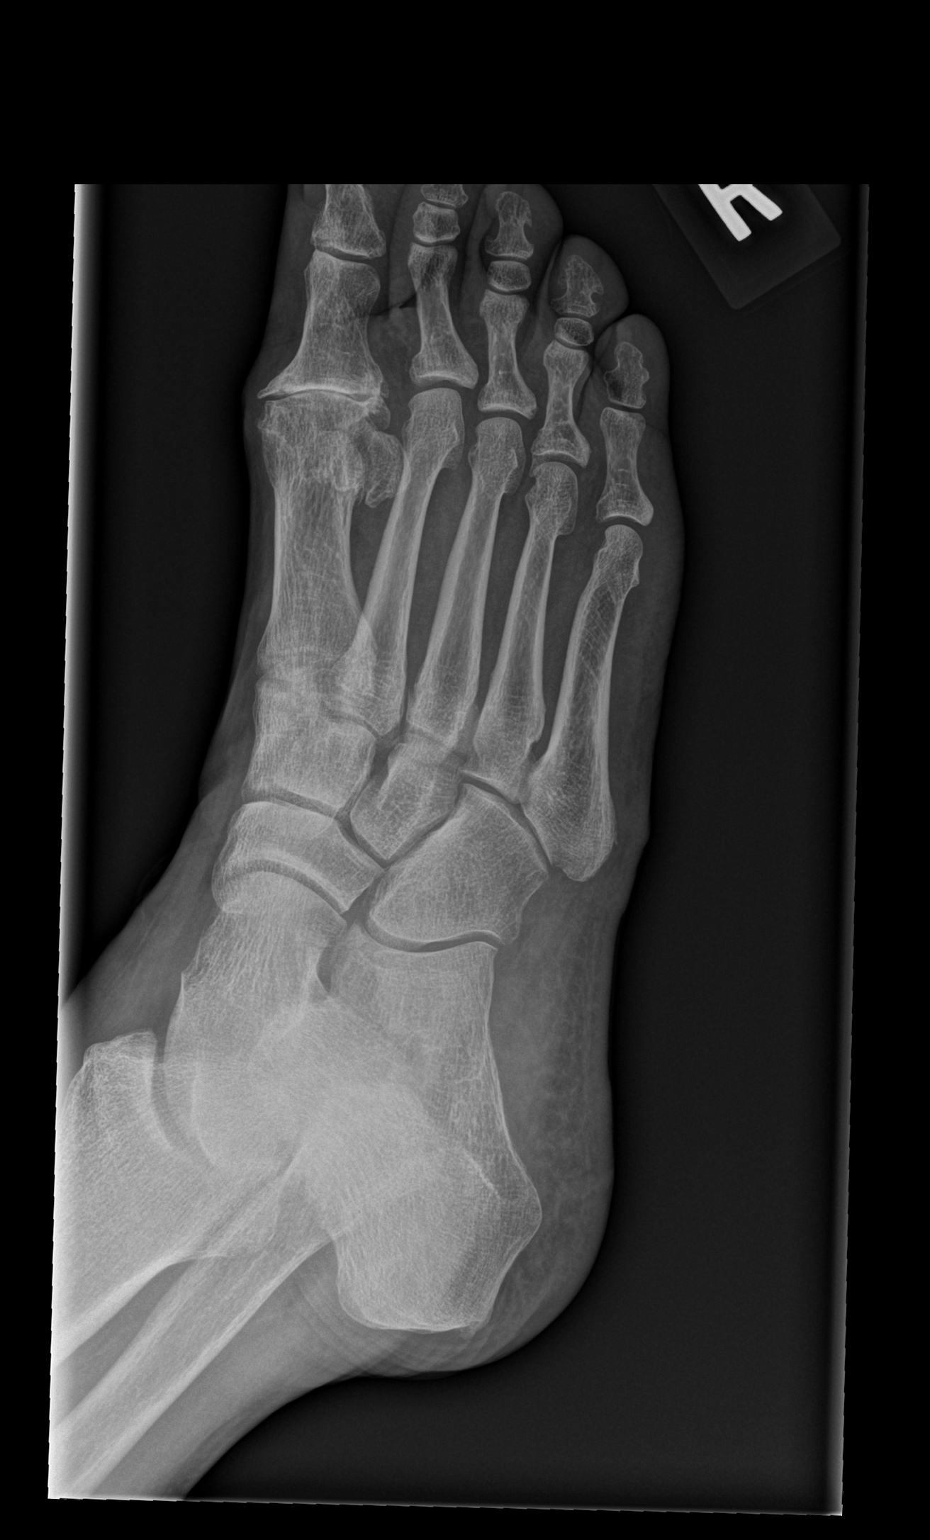

[3 of 3 positions shown; findings below may reference images not displayed]

FINDINGS: There is no acute bony or joint abnormality. The patient has severe
first MTP osteoarthritis where there is bone-on-bone joint space
narrowing and bulky osteophytosis. Soft tissues are unremarkable.
IMPRESSION: No acute abnormality.

Severe first MTP osteoarthritis.

## 2020-03-08 ENCOUNTER — Ambulatory Visit (INDEPENDENT_AMBULATORY_CARE_PROVIDER_SITE_OTHER): Payer: Medicare PPO | Admitting: Ophthalmology

## 2020-03-08 ENCOUNTER — Encounter (INDEPENDENT_AMBULATORY_CARE_PROVIDER_SITE_OTHER): Payer: Self-pay | Admitting: Ophthalmology

## 2020-03-08 ENCOUNTER — Other Ambulatory Visit: Payer: Self-pay

## 2020-03-08 DIAGNOSIS — H353122 Nonexudative age-related macular degeneration, left eye, intermediate dry stage: Secondary | ICD-10-CM

## 2020-03-08 DIAGNOSIS — H40052 Ocular hypertension, left eye: Secondary | ICD-10-CM | POA: Diagnosis not present

## 2020-03-08 DIAGNOSIS — H2512 Age-related nuclear cataract, left eye: Secondary | ICD-10-CM | POA: Insufficient documentation

## 2020-03-08 NOTE — Assessment & Plan Note (Signed)
Follow-up left eye for cataract with Dr. Carolynn Sayers as scheduled

## 2020-03-08 NOTE — Progress Notes (Signed)
03/08/2020     CHIEF COMPLAINT Patient presents for Eye Exam   HISTORY OF PRESENT ILLNESS: Paul Vasquez is a 74 y.o. male who presents to the clinic today for:   HPI    Eye Exam    In left eye.  Characterized as no change in Amsler.  Severity is mild.  I, the attending physician,  performed the HPI with the patient and updated documentation appropriately.          Comments    Pt referred by Dr. Clent Jacks for intermediate Dry AMD OS. Pt has a prosthetic OD, 40+ years. Pt states he has not noticed any changes in OS vision. Pt has seen a constant floater for many years, denies FOL. Pt had a minor stroke about a month ago. Pt states he has had many health issues since his wife passed away.       Last edited by Tilda Franco on 03/08/2020  9:25 AM. (History)      Referring physician: Clent Jacks, MD [address] [place],  [place] [address]  HISTORICAL INFORMATION:   Selected notes from the MEDICAL RECORD NUMBER    Lab Results  Component Value Date   HGBA1C 6.1 (H) 12/30/2019     CURRENT MEDICATIONS: No current outpatient medications on file. (Ophthalmic Drugs)   No current facility-administered medications for this visit. (Ophthalmic Drugs)   Current Outpatient Medications (Other)  Medication Sig  . aspirin EC 81 MG EC tablet Take 1 tablet (81 mg total) by mouth daily.  Marland Kitchen atorvastatin (LIPITOR) 40 MG tablet Take 1 tablet (40 mg total) by mouth daily.  . calcium-vitamin D (OSCAL WITH D) 500-200 MG-UNIT tablet Take 1 tablet by mouth.  . clonazePAM (KLONOPIN) 0.5 MG tablet Take 0.5 mg by mouth at bedtime.  Marland Kitchen escitalopram (LEXAPRO) 10 MG tablet Take 10 mg by mouth daily.  . NON FORMULARY Vitamin Code Grow Bone Calcium  . NON FORMULARY Emiliano Dyer Immune Stimulator daily  . NON FORMULARY Nature Sunshine Skeletal strength daily  . NON FORMULARY Growth factor daily  . NON FORMULARY Wheat grass daily  . NON FORMULARY Q-absorb Co-Q10  . NON  FORMULARY Apex Energetics Adaptocrine  . NON FORMULARY Apex Energetics Gabatone  . NON FORMULARY Apex Energetics AD-Pro (bitamin A and D)  . NON FORMULARY Apex Energetics AdrenaCalm S-E  . NON FORMULARY Sanesco Prolent  . Probiotic Product (SOLUBLE FIBER/PROBIOTICS PO) Take by mouth.  . vitamin C (ASCORBIC ACID) 500 MG tablet Take 500 mg by mouth 2 (two) times daily.   No current facility-administered medications for this visit. (Other)      REVIEW OF SYSTEMS:    ALLERGIES Allergies  Allergen Reactions  . Amoxicillin Other (See Comments)    un  . Sulfamethoxazole Other (See Comments)    un    PAST MEDICAL HISTORY Past Medical History:  Diagnosis Date  . Melanoma in situ of ear, right ([facility])   . Prostate cancer ([facility])   . Skin cancer    Past Surgical History:  Procedure Laterality Date  . MOHS SURGERY    . PROSTATECTOMY      FAMILY HISTORY Family History  Problem Relation Age of Onset  . Multiple myeloma Father     SOCIAL HISTORY Social History   Tobacco Use  . Smoking status: Never Smoker  . Smokeless tobacco: Never Used  Vaping Use  . Vaping Use: Never used  Substance Use Topics  . Alcohol use: Not Currently  .  Drug use: Never         OPHTHALMIC EXAM:  Base Eye Exam    Visual Acuity (Snellen - Linear)      Right Left   Dist [place]  20/20 -1   Correction: Glasses       Tonometry      Right Left   Pressure  33       Gonioscopy ([place] four mirror)      Right Left   Temporal  C30b 1+ptm   Nasal  C30b 1+ptm   Superior  C30b 1+ptm   Inferior  C30b 1+ptm       Pupils      Dark Light Shape React APD   Right        Left 4 3 Round Brisk None       Visual Fields (Counting fingers)      Left Right    Full    Restrictions  Total superior temporal, inferior temporal, superior nasal, inferior nasal deficiencies  Prosthesis OD       Neuro/Psych    Oriented x3: Yes   Mood/Affect: Normal       Dilation    Left eye: 2.5% Phenylephrine,  1.0% Mydriacyl @ 9:50 AM        Slit Lamp and Fundus Exam    External Exam      Right Left   External Normal Normal       Slit Lamp Exam      Right Left   Lids/Lashes Normal Normal   Conjunctiva/Sclera White and quiet White and quiet   Cornea Clear Clear   Anterior Chamber Prothesis  Deep and quiet   Iris Round and reactive Round and reactive   Lens Clear 2+ Nuclear sclerosis   Anterior Vitreous Normal Normal       Fundus Exam      Right Left   Posterior Vitreous prosthesis Posterior vitreous detachment   Disc  Normal   C/D Ratio  0.4   Macula  Intermediate age related macular degeneration, no macular thickening, Retinal pigment epithelial mottling, no hemorrhage, no exudates   Vessels  Normal   Periphery  Normal, 78d, 25d,, no masses or tumors or nevi          IMAGING AND PROCEDURES  Imaging and Procedures for 03/08/20  OCT, Retina - OS - Left Eye       Quality was good. Scan locations included subfoveal. Central Foveal Thickness: 319. Progression has no prior data. Findings include normal foveal contour, retinal drusen .   Notes  No signs of active CNVM. Large soft drusen, patient should commence with oral vitamin therapy                ASSESSMENT/PLAN:  Ocular hypertension of left eye I will asked patient to return to see Dr. Carolynn Sayers or his associates for ocular hypertension found today on our examination.    Cataract, nuclear sclerotic, left eye Follow-up left eye for cataract with Dr. Carolynn Sayers as scheduled  Intermediate stage nonexudative age-related macular degeneration of left eye Patient commence with oral vitamin therapy The nature of age--related macular degeneration was discussed with the patient as well as the distinction between dry and wet types. Checking an Amsler Grid daily with advice to return immediately should a distortion develop, was given to the patient. The patient 's smoking status now and in the past was determined and advice  based on the AREDS study was provided regarding the consumption of antioxidant supplements. AREDS  2 vitamin formulation was recommended. Consumption of dark leafy vegetables and fresh fruits of various colors was recommended. Treatment modalities for wet macular degeneration particularly the use of intravitreal injections of anti-blood vessel growth factors was discussed with the patient. Avastin, Lucentis, and Eylea are the available options. On occasion, therapy includes the use of photodynamic therapy and thermal laser. Stressed to the patient do not rub eyes.  Patient was advised to check Amsler Grid daily and return immediately if changes are noted. Instructions on using the grid were given to the patient. All patient questions were answered.      ICD-10-CM   1. Intermediate stage nonexudative age-related macular degeneration of left eye  H35.3122 OCT, Retina - OS - Left Eye  2. Ocular hypertension of left eye  H40.052   3. Cataract, nuclear sclerotic, left eye  H25.12     1.  2.  3.  Ophthalmic Meds Ordered this visit:  No orders of the defined types were placed in this encounter.      Return in about 6 months (around 09/08/2020) for dilate, OS, COLOR FP, OCT.  Patient Instructions  Age-Related Macular Degeneration  Age-related macular degeneration (AMD) is an eye disease related to aging. The disease causes a loss of central vision. Central vision allows a person to see objects clearly and do daily tasks like reading and driving. There are two main types of AMD:  Dry AMD. People with this type generally lose their vision slowly. This is the most common type of AMD. Some people with dry AMD notice very little change in their vision as they age.  Wet AMD. People with this type can lose their vision quickly. What are the causes? This condition is caused by damage to the part of the eye that provides you with central vision (macula).  Dry AMD happens when deposits in the macula  cause light-sensitive cells to slowly break down.  Wet AMD happens when abnormal blood vessels grow under the macula and leak blood and fluid. What increases the risk? You are more likely to develop this condition if you:  Are 29 years old or older, and especially 62 years old or older.  Smoke.  Are obese.  Have a family history of AMD.  Have high cholesterol, high blood pressure, or heart disease.  Have been exposed to high levels of ultraviolet (UV) light and blue light.  Are white (Caucasian).  Are male. What are the signs or symptoms? Common symptoms of this condition include:  Blurred vision, especially when reading print material. The blurred vision often improves in brighter light.  A blurred or blind spot in the center of your field of vision that is small but growing larger.  Bright colors seeming less bright than they used to be.  Decreased ability to recognize and see faces.  One eye seeing worse than the other.  Decreased ability to adapt to dimly lit rooms.  Straight lines appearing crooked or wavy. How is this diagnosed? This condition is diagnosed based on your symptoms and an eye exam. During the eye exam:  Eye drops will be placed into your eyes to enlarge (dilate) your pupils. This will allow your health care provider to see the back of your eye.  You may be asked to look at an image that looks like a checkerboard (Amsler grid). Early changes in your central vision may cause the grid to appear distorted. After the exam, you may be given one or both of these tests:  Fluorescein  angiogram. This test determines whether you have dry or wet AMD.  Optical coherence tomography (OCT) test to evaluate deep layers of the retina. How is this treated? There is no cure for this condition, but treatment can help to slow down progression of the disease. This condition may be treated with:  Supplements, including vitamin C, vitamin E, beta carotene, and  zinc.  Laser surgery to destroy new blood vessels or leaking blood vessels in your eye.  Injections of medicines into your eye to slow down the formation of abnormal blood vessels that may leak. These injections may need to be repeated on a routine basis. Follow these instructions at home:  Take over-the-counter and prescription medicines only as told by your health care provider.  Take vitamins and supplements as told by your health care provider.  Ask your health care provider for an Amsler grid. Use it every day to check each eye for vision changes.  Get an eye exam as often as told by your health care provider. Make sure to get an eye exam at least once every year.  Keep all follow-up visits as told by your health care provider. This is important. Contact a health care provider if:  You notice any new changes in your vision. Get help right away if:  You suddenly lose vision or develop pain in the eye. Summary  Age-related macular degeneration (AMD) is an eye disease related to aging. There are two types of this condition: dry AMD and wet AMD.  This condition is caused by damage to the part of the eye that provides you with central vision (macula).  Once diagnosed with AMD, make sure to get an eye exam every year, take supplements and vitamins as directed, use an Amsler grid at home, and follow up with your health care provider. This information is not intended to replace advice given to you by your health care provider. Make sure you discuss any questions you have with your health care provider. Document Revised: 02/04/2018 Document Reviewed: 02/04/2018 Elsevier Patient Education  [employer].   Patient is to notify this office or Dr. Carolynn Sayers should new onset distortions develop in his only eye    Explained the diagnoses, plan, and follow up with the patient and they expressed understanding.  Patient expressed understanding of the importance of proper follow up care.    Clent Demark Fairley Copher M.D. Diseases & Surgery of the Retina and Vitreous [facility] 03/08/20     Abbreviations: M myopia (nearsighted); A astigmatism; H hyperopia (farsighted); P presbyopia; Mrx spectacle prescription;  CTL contact lenses; OD right eye; OS left eye; OU both eyes  XT exotropia; ET esotropia; PEK punctate epithelial keratitis; PEE punctate epithelial erosions; DES dry eye syndrome; MGD meibomian gland dysfunction; ATs artificial tears; PFAT's preservative free artificial tears; [place] nuclear sclerotic cataract; PSC posterior subcapsular cataract; ERM epi-retinal membrane; PVD posterior vitreous detachment; RD retinal detachment; DM diabetes mellitus; DR diabetic retinopathy; NPDR non-proliferative diabetic retinopathy; PDR proliferative diabetic retinopathy; CSME clinically significant macular edema; DME diabetic macular edema; dbh dot blot hemorrhages; CWS cotton wool spot; POAG primary open angle glaucoma; C/D cup-to-disc ratio; HVF humphrey visual field; GVF goldmann visual field; OCT optical coherence tomography; IOP intraocular pressure; BRVO Branch retinal vein occlusion; CRVO central retinal vein occlusion; CRAO central retinal artery occlusion; BRAO branch retinal artery occlusion; RT retinal tear; SB scleral buckle; PPV pars plana vitrectomy; VH Vitreous hemorrhage; PRP panretinal laser photocoagulation; IVK intravitreal kenalog; VMT vitreomacular traction; MH  Macular hole;  NVD neovascularization of the disc; NVE neovascularization elsewhere; AREDS age related eye disease study; ARMD age related macular degeneration; POAG primary open angle glaucoma; EBMD epithelial/anterior basement membrane dystrophy; ACIOL anterior chamber intraocular lens; IOL intraocular lens; PCIOL posterior chamber intraocular lens; Phaco/IOL phacoemulsification with intraocular lens placement; [place] photorefractive keratectomy; LASIK laser assisted in situ keratomileusis; HTN hypertension; DM  diabetes mellitus; COPD chronic obstructive pulmonary disease

## 2020-03-08 NOTE — Assessment & Plan Note (Signed)
Patient commence with oral vitamin therapy The nature of age--related macular degeneration was discussed with the patient as well as the distinction between dry and wet types. Checking an Amsler Grid daily with advice to return immediately should a distortion develop, was given to the patient. The patient 's smoking status now and in the past was determined and advice based on the AREDS study was provided regarding the consumption of antioxidant supplements. AREDS 2 vitamin formulation was recommended. Consumption of dark leafy vegetables and fresh fruits of various colors was recommended. Treatment modalities for wet macular degeneration particularly the use of intravitreal injections of anti-blood vessel growth factors was discussed with the patient. Avastin, Lucentis, and Eylea are the available options. On occasion, therapy includes the use of photodynamic therapy and thermal laser. Stressed to the patient do not rub eyes.  Patient was advised to check Amsler Grid daily and return immediately if changes are noted. Instructions on using the grid were given to the patient. All patient questions were answered.

## 2020-03-08 NOTE — Assessment & Plan Note (Signed)
I will asked patient to return to see Dr. Carolynn Sayers or his associates for ocular hypertension found today on our examination.

## 2020-03-08 NOTE — Patient Instructions (Signed)
Age-Related Macular Degeneration  Age-related macular degeneration (AMD) is an eye disease related to aging. The disease causes a loss of central vision. Central vision allows a person to see objects clearly and do daily tasks like reading and driving. There are two main types of AMD:  Dry AMD. People with this type generally lose their vision slowly. This is the most common type of AMD. Some people with dry AMD notice very little change in their vision as they age.  Wet AMD. People with this type can lose their vision quickly. What are the causes? This condition is caused by damage to the part of the eye that provides you with central vision (macula).  Dry AMD happens when deposits in the macula cause light-sensitive cells to slowly break down.  Wet AMD happens when abnormal blood vessels grow under the macula and leak blood and fluid. What increases the risk? You are more likely to develop this condition if you:  Are 50 years old or older, and especially 75 years old or older.  Smoke.  Are obese.  Have a family history of AMD.  Have high cholesterol, high blood pressure, or heart disease.  Have been exposed to high levels of ultraviolet (UV) light and blue light.  Are white (Caucasian).  Are male. What are the signs or symptoms? Common symptoms of this condition include:  Blurred vision, especially when reading print material. The blurred vision often improves in brighter light.  A blurred or blind spot in the center of your field of vision that is small but growing larger.  Bright colors seeming less bright than they used to be.  Decreased ability to recognize and see faces.  One eye seeing worse than the other.  Decreased ability to adapt to dimly lit rooms.  Straight lines appearing crooked or wavy. How is this diagnosed? This condition is diagnosed based on your symptoms and an eye exam. During the eye exam:  Eye drops will be placed into your eyes to  enlarge (dilate) your pupils. This will allow your health care provider to see the back of your eye.  You may be asked to look at an image that looks like a checkerboard (Amsler grid). Early changes in your central vision may cause the grid to appear distorted. After the exam, you may be given one or both of these tests:  Fluorescein angiogram. This test determines whether you have dry or wet AMD.  Optical coherence tomography (OCT) test to evaluate deep layers of the retina. How is this treated? There is no cure for this condition, but treatment can help to slow down progression of the disease. This condition may be treated with:  Supplements, including vitamin C, vitamin E, beta carotene, and zinc.  Laser surgery to destroy new blood vessels or leaking blood vessels in your eye.  Injections of medicines into your eye to slow down the formation of abnormal blood vessels that may leak. These injections may need to be repeated on a routine basis. Follow these instructions at home:  Take over-the-counter and prescription medicines only as told by your health care provider.  Take vitamins and supplements as told by your health care provider.  Ask your health care provider for an Amsler grid. Use it every day to check each eye for vision changes.  Get an eye exam as often as told by your health care provider. Make sure to get an eye exam at least once every year.  Keep all follow-up visits as told by   your health care provider. This is important. Contact a health care provider if:  You notice any new changes in your vision. Get help right away if:  You suddenly lose vision or develop pain in the eye. Summary  Age-related macular degeneration (AMD) is an eye disease related to aging. There are two types of this condition: dry AMD and wet AMD.  This condition is caused by damage to the part of the eye that provides you with central vision (macula).  Once diagnosed with AMD, make sure  to get an eye exam every year, take supplements and vitamins as directed, use an Amsler grid at home, and follow up with your health care provider. This information is not intended to replace advice given to you by your health care provider. Make sure you discuss any questions you have with your health care provider. Document Revised: 02/04/2018 Document Reviewed: 02/04/2018 Elsevier Patient Education  [employer].   Patient is to notify this office or Dr. Carolynn Sayers should new onset distortions develop in his only eye

## 2020-03-09 ENCOUNTER — Telehealth: Payer: Self-pay | Admitting: Internal Medicine

## 2020-03-09 NOTE — Telephone Encounter (Signed)
Spoke with patient to see if we could change the time of the Palliative f/u visit scheduled on 03/20/20 from 3 to 3;45 PM (so that we could work another patient in) and he was in agreement with this.

## 2020-03-20 ENCOUNTER — Other Ambulatory Visit: Payer: Self-pay

## 2020-03-20 ENCOUNTER — Other Ambulatory Visit: Payer: Medicare PPO | Admitting: Internal Medicine

## 2020-03-20 DIAGNOSIS — Z7189 Other specified counseling: Secondary | ICD-10-CM

## 2020-03-20 DIAGNOSIS — Z515 Encounter for palliative care: Secondary | ICD-10-CM

## 2020-03-21 ENCOUNTER — Encounter: Payer: Self-pay | Admitting: Internal Medicine

## 2020-03-21 NOTE — Progress Notes (Signed)
Aug 9th, 2021 [facility] Consult Note Telephone: [phone]  Fax: [phone]   PATIENT NAME: Paul Vasquez DOB: [birth year] MRN: [id number] [address] [place] [address] [phone]   PRIMARY CARE PROVIDER:   [name], MD   REFERRING PROVIDER:  [name], MD [place],  [place] [address]   RESPONSIBLE PARTY:  Grayce Sessions (Sister), [phone] (Mobile)   ASSESSMENT / RECOMMENDATIONS:  1. Advance Care Planning: A. Directives:  DNR form in the home and uploaded in [place]. Goals of Care: To gain closure concerning the loss of his wife last year.    2. Cognitive / Functional status:  Independent in all ADLs. No appreciable residual effects s/p stroke 3 months earlier. He had recent eye exam which revealed elevated pressures in his L eye (has a R eye prosthesis) as well as evidence of macular degeneration. He's careful to f/u on that.   He is prediabetic by A1c. He's exploring some leads for an appointment with a Registered Dietitian for guidance on good food choices. We did discuss possibility that he may need antihyperglycemic medication as well. He has a normal BMI and already eats a very healthy diet.    He's in the best spirits I've ever seen him. 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 He's thrown himself into this work and feels he's impactful. Ongoing plans to move to [facility] apartment. He's active in the [employer]. He continues Lexapro. Continues to grieve the loss of his spouse, but is reframing so that he can continue a happy and productive life.  Insomnia better managed with Melatonin 1mg  around dinnertime, then a 2 mg time released preparation at hs. Past use of small dose Clonazepam (1/4 of a 1mg  tab) caused residual "hang over" effect the next day.    3. Family /Community  Supports: Network of family and friends in place. He had tapped into [employer] community grief counseling last year; didn't feel he would benefit from a redo. He sees a Building surveyor. Patient is planning on relocating to [facility] possibly in October. He feels it will be a good fit. Still close to his volunteer job.   4. Follow up Palliative Care: Mon 05/01/2020 @ 3pm with a new palliative care practitioner assigned to his area.  I spent 60 minutes providing this consultation from 4-5pm. More than 50% of the time in this consultation was spent coordinating communication.    HISTORY OF PRESENT ILLNESS:  Paul Vasquez is a 74 y.o. 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  Prostate cancer on Lupron. H/O HTN, hypokalemia, adrenal tumor s/p radiation, depression.   -5/19- 12/31/2019 hospitalized with stroke. R sided weakness with unsteady gait. MRI: L posterior basal ganglia/posterior limb internal capsule infarct thought 2/2 small vessel disease. Initial DAPT then aspirin only. Too much time had gone by for thrombolysis Rx. Incidental small sclerosis L superolateral orbital rim, ? mets. Echo cardiogram: EF 60-65%.   This is a f/u Palliative Care visit from June 18th.    CODE STATUS: DNR   PPS: 100%   HOSPICE ELIGIBILITY/DIAGNOSIS: TBD  PAST MEDICAL HISTORY:  Past Medical History:  Diagnosis Date  . Melanoma in situ of ear, right ([place])   .  Prostate cancer ([place])   . Skin cancer     SOCIAL HX:  Social History   Tobacco Use  . Smoking status: Never Smoker  . Smokeless tobacco: Never Used  Substance Use Topics  . Alcohol use: Not Currently    ALLERGIES:  Allergies  Allergen Reactions  . Amoxicillin Other (See Comments)    un  . Sulfamethoxazole Other (See Comments)    un     PERTINENT MEDICATIONS:  Outpatient Encounter  Medications as of 03/20/2020  Medication Sig  . aspirin EC 81 MG EC tablet Take 1 tablet (81 mg total) by mouth daily.  Marland Kitchen atorvastatin (LIPITOR) 40 MG tablet Take 1 tablet (40 mg total) by mouth daily.  . calcium-vitamin D (OSCAL WITH D) 500-200 MG-UNIT tablet Take 1 tablet by mouth.  . clonazePAM (KLONOPIN) 0.5 MG tablet Take 0.5 mg by mouth at bedtime.  Marland Kitchen escitalopram (LEXAPRO) 10 MG tablet Take 10 mg by mouth daily.  . NON FORMULARY Vitamin Code Grow Bone Calcium  . NON FORMULARY Emiliano Dyer Immune Stimulator daily  . NON FORMULARY Nature Sunshine Skeletal strength daily  . NON FORMULARY Growth factor daily  . NON FORMULARY Wheat grass daily  . NON FORMULARY Q-absorb Co-Q10  . NON FORMULARY Apex Energetics Adaptocrine  . NON FORMULARY Apex Energetics Gabatone  . NON FORMULARY Apex Energetics AD-Pro (bitamin A and D)  . NON FORMULARY Apex Energetics AdrenaCalm S-E  . NON FORMULARY Sanesco Prolent  . Probiotic Product (SOLUBLE FIBER/PROBIOTICS PO) Take by mouth.  . vitamin C (ASCORBIC ACID) 500 MG tablet Take 500 mg by mouth 2 (two) times daily.   No facility-administered encounter medications on file as of 03/20/2020.    Julianne Handler, NP  [place]

## 2020-03-30 ENCOUNTER — Other Ambulatory Visit: Payer: Self-pay

## 2020-03-30 ENCOUNTER — Encounter: Payer: Medicare PPO | Attending: Family Medicine | Admitting: Dietician

## 2020-03-30 ENCOUNTER — Encounter: Payer: Self-pay | Admitting: Dietician

## 2020-03-30 DIAGNOSIS — R7303 Prediabetes: Secondary | ICD-10-CM | POA: Insufficient documentation

## 2020-03-30 DIAGNOSIS — Z8673 Personal history of transient ischemic attack (TIA), and cerebral infarction without residual deficits: Secondary | ICD-10-CM | POA: Diagnosis not present

## 2020-03-30 DIAGNOSIS — M810 Age-related osteoporosis without current pathological fracture: Secondary | ICD-10-CM

## 2020-03-30 DIAGNOSIS — M818 Other osteoporosis without current pathological fracture: Secondary | ICD-10-CM | POA: Diagnosis not present

## 2020-03-30 NOTE — Progress Notes (Signed)
Medical Nutrition Therapy:  Appt start time: 1400 end time:  1705.   Assessment:  Primary concerns today: Patient is here today alone. His wife passed 14 months ago and since that time he has had increased medical issues including a stroke in May 2021.  His primary care physician Dr. Jacelyn Grip referred him for prediabetes and osteoporosis.  He states that his naturopathic doctor wants him to do more fasting. Other history includes prostate cancer. Labs noted to include:  A1C was 6.1% 12/2019 and decreased to 5.9% 03/02/20.  Vitamin D 95 03/02/2020. He is on multiple supplements to include a probiotic, vitamin A, B-12, folate and D, Co-Q 10, Bone health, wheat grass.     He states that he lost a couple of pounds in May during his hospital stay but is overall stable.  Weight today was 137 lbs decreased from 138.8 lbs 03/02/20.  Patient lives alone.  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 He is moving into a town home at [place] in October.  He doesn't know how many meals he will buy there.  Preferred Learning Style:   No preference indicated   Learning Readiness:   Ready  Change in progress   DIETARY INTAKE:  Usual eating pattern includes 3 meals and 1 snacks per day. He brought a food diary including food labels of foods usually eaten.  Everyday foods include .  Avoided foods include Dairy, eggs He does not cook much and relies on easy meals.    24-hr recall:  B ( AM): 1 cup green tea (plain), 8 oz pomegranate juice, 5 almond nut thins with almond butter, steel cut oats, raisins or dried cranberries and fresh fruit (blueberries, peach, or apple) Snk ( AM): none  L ( PM): 1 [employer] tortilla, smoked [place]- but more recent hummus, lettuce, tomatoes, raw carrots, non dairy yogurt Snk ( PM): fruit, mixed nuts D ( PM): Meals with or from neighbors 3 times per week OR [place] or [place] chicken out to eat Snk ( PM):  none Beverages: water, green tea, pomegranate juice, occasional limeaide  Usual physical activity: walks 45-60 minutes 4-5 days per week, considers that he is quite active.  Goes to [employer] once per week.  Estimated energy needs: 1800-2000 calories 60-70 g protein  Progress Towards Goal(s):  In progress.   Nutritional Diagnosis:  NB-1.1 Food and nutrition-related knowledge deficit As related to nutrition for prediabetes and osteoporosis with current restricitons.  As evidenced by diet hx and patient report.    Intervention:  Nutrition education regarding non dairy sources of calcium, benefits of increased greens, low sodium, adequate but not excessive protein intake for osteoporosis.  Discussed prediabetes, insulin resistance, causes and treatments.   Discussed the benefits of increased activity including weight bearing activity for osteoporosis and prediabetes. Discussed simple meal planning and meal adequacy.  Plan: Exercise for improved bone health: Resistance bands Light weights  Or consider Osteo Strong if you feel that this is helping.- Consider another bone density scan to determine your bone mass to help you evaluate the effectiveness of Osteo Strong.  Consider options to stay active most days of the week.  Increase your greens and non starchy vegetables as able. Add beans to your salad to add protein  Soup and salad or wrap   Eat whole fruit rather than juice most often.  Consider reading: Building Bone Vitality: a Revolutionary Diet Plan to Prevent Bone Loss and  Reverse Osteoporosis--Without Dairy Foods, Calcium, Estrogen, or Drugs by Legrand Como Castleman and Amy Lanou  Teaching Method Utilized:  Visual Auditory Hands on  Handouts given during visit include:  Osteoporosis nutrition therapy from AND  Barriers to learning/adherence to lifestyle change: none  Demonstrated degree of understanding via:  Teach Back   Monitoring/Evaluation:  Dietary intake,  exercise, and body weight prn.

## 2020-03-30 NOTE — Patient Instructions (Addendum)
Exercise for improved bone health: Resistance bands Light weights  Or consider Osteo Strong if you feel that this is helping.- Consider another bone density scan to determine your bone mass to help you evaluate the effectiveness of Osteo Strong.  Consider options to stay active most days of the week.  Increase your greens and non starchy vegetables as able. Add beans to your salad to add protein  Soup and salad or wrap   Eat whole fruit rather than juice most often.  Consider reading: Building Bone Vitality: a Revolutionary Diet Plan to Prevent Bone Loss and Reverse Osteoporosis--Without Dairy Foods, Calcium, Estrogen, or Drugs by Legrand Como Castleman and Amy Lanou

## 2020-04-07 ENCOUNTER — Other Ambulatory Visit: Payer: Self-pay

## 2020-04-07 ENCOUNTER — Ambulatory Visit
Admission: RE | Admit: 2020-04-07 | Discharge: 2020-04-07 | Disposition: A | Discharge status: Home / Self Care | Payer: Medicare PPO | Source: Ambulatory Visit | Attending: Urology | Admitting: Urology

## 2020-04-07 ENCOUNTER — Encounter: Payer: Self-pay | Admitting: Urology

## 2020-04-07 VITALS — BP 119/70 | HR 63 | Temp 98.1°F | Resp 18 | Ht 68.0 in | Wt 135.0 lb

## 2020-04-07 DIAGNOSIS — Z923 Personal history of irradiation: Secondary | ICD-10-CM | POA: Insufficient documentation

## 2020-04-07 DIAGNOSIS — C44320 Squamous cell carcinoma of skin of unspecified parts of face: Secondary | ICD-10-CM | POA: Diagnosis not present

## 2020-04-07 DIAGNOSIS — Z79899 Other long term (current) drug therapy: Secondary | ICD-10-CM | POA: Diagnosis not present

## 2020-04-09 NOTE — Progress Notes (Signed)
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 Squamous cell carcinoma of face  C44.320     Interval Since Last Radiation:  9 months 05/18/2019 - 06/28/2019: Right Temple / 60 Gy in 30 fractions  Narrative:  The patient returns today for routine follow-up.  He tolerated his treatment well. He denied pain. He did develop hyperpigmentation and alopecia noted in the treatment field with a 1 cm area of broken skin toward the back of the treatment field managed with neosporin and sonafine. He denied headaches or visual changes. The lid of the right eye was noted to be swollen and drooping and he did experience mild fatigue but reported that it did not impair daily activities.  Subsequently, he had a follow up visit with Dr. Levada Dy at the [facility] on 09/06/2019 and underwent a right brow pexy for correction of the ptosis that was resultant from injury to the temporal branch of the facial nerve at the time of Moh's surgery. He has recovered well from this procedure and is pleased with the results.  He has also established care with Dr. Wilhemina Bonito at [facility] dermatology and completed a course of Efudex for local treatment of precancerous lesions on his scalp and forehead. He was seen in follow up with Dr. Ronnald Ramp on 11/12/19 with plans to continue on a 51-month follow-up basis.  On review of systems, the patient reports that he is doing well overall. He has discontinued use of the Sonafine cream and reports resolution of the redness and itching in the treatment field. He is currently using Vitamin E oil in the treatment field and massaging this along the incision.   He has not had any open skin  wounds or drainage/bleeding. 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  In general, he feels well and is pleased with his progress to date.   ALLERGIES:  is allergic to amoxicillin and sulfamethoxazole.  Meds: Current Outpatient Medications  Medication Sig Dispense Refill  . 5-Methyltetrahydrofolate (METHYL FOLATE) POWD by Does not apply route.    Marland Kitchen escitalopram (LEXAPRO) 10 MG tablet Take 10 mg by mouth daily.    . NON FORMULARY Vitamin Code Grow Bone Calcium    . NON FORMULARY Emiliano Dyer Immune Stimulator daily    . NON FORMULARY Nature Sunshine Skeletal strength daily    . NON FORMULARY Growth factor daily    . NON FORMULARY Wheat grass daily    . NON FORMULARY Q-absorb Co-Q10    . NON FORMULARY Apex Energetics Adaptocrine    . NON FORMULARY Apex Energetics Gabatone    . NON FORMULARY Apex Energetics AD-Pro (bitamin A and D)    . NON FORMULARY Apex Energetics AdrenaCalm S-E    . Probiotic Product (SOLUBLE FIBER/PROBIOTICS PO) Take by mouth.    Marland Kitchen atorvastatin (LIPITOR) 40 MG tablet Take 1 tablet (40 mg total) by mouth daily. 90 tablet 0  . calcium-vitamin D (OSCAL WITH D) 500-200 MG-UNIT tablet Take 1 tablet by mouth. (Patient not taking: Reported  on 03/30/2020)    . clonazePAM (KLONOPIN) 0.5 MG tablet Take 0.5 mg by mouth at bedtime. (Patient not taking: Reported on 03/30/2020)    . NON FORMULARY Sanesco Prolent (Patient not taking: Reported on 04/07/2020)    . vitamin C (ASCORBIC ACID) 500 MG tablet Take 500 mg by mouth 2 (two) times daily. (Patient not taking: Reported on 03/30/2020)     No current facility-administered medications for this encounter.    Physical Findings:  height is 5\' 8"  (1.727 m) and weight is 135 lb (61.2 kg). His oral temperature is 98.1 F (36.7 C). His blood pressure is 119/70 and his pulse is 63. His respiration is  18 and oxygen saturation is 100%. .  In general this is a well appearing caucasian male in no acute distress. He's alert and oriented x4 and appropriate throughout the examination. Cardiopulmonary assessment is negative for acute distress and he exhibits normal effort.  The mild erythema in the treatment field has resolved. He is s/p right brow pexy with Dr. Levada Dy on 09/06/19 for correction of the ptosis of the right eyelid and the small incision appears well healed and ptosis much improved. There is no palpable nodularity along his incision and throughout the treatment field and no tenderness to palpation. No cervical lymphadenopathy.   Lab Findings: Lab Results  Component Value Date   WBC 4.0 12/31/2019   HGB 13.4 12/31/2019   HCT 40.1 12/31/2019   PLT 210 12/31/2019    Lab Results  Component Value Date   NA 140 12/31/2019   K 3.7 12/31/2019   CO2 24 12/31/2019   GLUCOSE 97 12/31/2019   BUN 18 12/31/2019   CREATININE 0.80 12/31/2019   BILITOT 0.9 12/30/2019   ALKPHOS 56 12/30/2019   AST 14 (L) 12/30/2019   ALT 13 12/30/2019   PROT 5.5 (L) 12/30/2019   ALBUMIN 3.4 (L) 12/30/2019   CALCIUM 8.6 (L) 12/31/2019   ANIONGAP 8 12/31/2019    Radiographic Findings: No results found.  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  He has recovered well from the effects of radiation and he is currently without complaints.  We discussed that while we are happy to continue to participate in his care if clinically indicated in the future, at this point, we will plan to see him back on an as-needed basis.  He will continue in routine follow up under the care and direction of Dr. Wilhemina Bonito at [facility] dermatology for continued close observation and skin survey. He knows that he is welcome to call at anytime with any questions or concerns related to his recent radiotherapy and he is comfortable and in agreement with the stated  plan.   [name], PA-C    [name], MD  [facility] Direct Dial: [phone]  Fax: [phone] [email or web address]  Skype  LinkedIn

## 2020-04-27 ENCOUNTER — Ambulatory Visit: Payer: Medicare PPO | Admitting: Skilled Nursing Facility1

## 2020-05-10 ENCOUNTER — Ambulatory Visit: Payer: Medicare PPO | Admitting: Adult Health

## 2020-05-10 ENCOUNTER — Other Ambulatory Visit: Payer: Self-pay

## 2020-05-10 ENCOUNTER — Encounter: Payer: Self-pay | Admitting: Adult Health

## 2020-05-10 VITALS — BP 126/72 | HR 61 | Ht 67.0 in | Wt 136.0 lb

## 2020-05-10 DIAGNOSIS — I10 Essential (primary) hypertension: Secondary | ICD-10-CM

## 2020-05-10 DIAGNOSIS — E785 Hyperlipidemia, unspecified: Secondary | ICD-10-CM

## 2020-05-10 DIAGNOSIS — I6381 Other cerebral infarction due to occlusion or stenosis of small artery: Secondary | ICD-10-CM

## 2020-05-10 DIAGNOSIS — I639 Cerebral infarction, unspecified: Secondary | ICD-10-CM

## 2020-05-10 NOTE — Progress Notes (Signed)
I agree with the above plan 

## 2020-05-10 NOTE — Patient Instructions (Addendum)
Continue aspirin 81 mg daily  and atorvastatin  for secondary stroke prevention  Continue to follow up with PCP regarding cholesterol and blood pressure management  Maintain strict control of hypertension with blood pressure goal below 130/90 and cholesterol with LDL cholesterol (bad cholesterol) goal below 70 mg/dL.     Followup in the future with me in 6 months or call earlier if needed      Thank you for coming to see [place] at [facility]. I hope we have been able to provide you high quality care today.  You may receive a patient satisfaction survey over the next few weeks. We would appreciate your feedback and comments so that we may continue to improve ourselves and the health of our patients.    Stroke Prevention Some medical conditions and behaviors are associated with a higher chance of having a stroke. You can help prevent a stroke by making nutrition, lifestyle, and other changes, including managing any medical conditions you may have. What nutrition changes can be made?   Eat healthy foods. You can do this by: ? Choosing foods high in fiber, such as fresh fruits and vegetables and whole grains. ? Eating at least 5 or more servings of fruits and vegetables a day. Try to fill half of your plate at each meal with fruits and vegetables. ? Choosing lean protein foods, such as lean cuts of meat, poultry without skin, fish, tofu, beans, and nuts. ? Eating low-fat dairy products. ? Avoiding foods that are high in salt (sodium). This can help lower blood pressure. ? Avoiding foods that have saturated fat, trans fat, and cholesterol. This can help prevent high cholesterol. ? Avoiding processed and premade foods.  Follow your health care provider's specific guidelines for losing weight, controlling high blood pressure (hypertension), lowering high cholesterol, and managing diabetes. These may include: ? Reducing your daily calorie intake. ? Limiting your daily  sodium intake to 1,500 milligrams (mg). ? Using only healthy fats for cooking, such as olive oil, canola oil, or sunflower oil. ? Counting your daily carbohydrate intake. What lifestyle changes can be made?  Maintain a healthy weight. Talk to your health care provider about your ideal weight.  Get at least 30 minutes of moderate physical activity at least 5 days a week. Moderate activity includes brisk walking, biking, and swimming.  Do not use any products that contain nicotine or tobacco, such as cigarettes and e-cigarettes. If you need help quitting, ask your health care provider. It may also be helpful to avoid exposure to secondhand smoke.  Limit alcohol intake to no more than 1 drink a day for nonpregnant women and 2 drinks a day for men. One drink equals 12 oz of beer, 5 oz of wine, or 1 oz of hard liquor.  Stop any illegal drug use.  Avoid taking birth control pills. Talk to your health care provider about the risks of taking birth control pills if: ? You are over 24 years old. ? You smoke. ? You get migraines. ? You have ever had a blood clot. What other changes can be made?  Manage your cholesterol levels. ? Eating a healthy diet is important for preventing high cholesterol. If cholesterol cannot be managed through diet alone, you may also need to take medicines. ? Take any prescribed medicines to control your cholesterol as told by your health care provider.  Manage your diabetes. ? Eating a healthy diet and exercising regularly are important parts of managing your blood sugar. If  your blood sugar cannot be managed through diet and exercise, you may need to take medicines. ? Take any prescribed medicines to control your diabetes as told by your health care provider.  Control your hypertension. ? To reduce your risk of stroke, try to keep your blood pressure below 130/80. ? Eating a healthy diet and exercising regularly are an important part of controlling your blood  pressure. If your blood pressure cannot be managed through diet and exercise, you may need to take medicines. ? Take any prescribed medicines to control hypertension as told by your health care provider. ? Ask your health care provider if you should monitor your blood pressure at home. ? Have your blood pressure checked every year, even if your blood pressure is normal. Blood pressure increases with age and some medical conditions.  Get evaluated for sleep disorders (sleep apnea). Talk to your health care provider about getting a sleep evaluation if you snore a lot or have excessive sleepiness.  Take over-the-counter and prescription medicines only as told by your health care provider. Aspirin or blood thinners (antiplatelets or anticoagulants) may be recommended to reduce your risk of forming blood clots that can lead to stroke.  Make sure that any other medical conditions you have, such as atrial fibrillation or atherosclerosis, are managed. What are the warning signs of a stroke? The warning signs of a stroke can be easily remembered as BEFAST.  B is for balance. Signs include: ? Dizziness. ? Loss of balance or coordination. ? Sudden trouble walking.  E is for eyes. Signs include: ? A sudden change in vision. ? Trouble seeing.  F is for face. Signs include: ? Sudden weakness or numbness of the face. ? The face or eyelid drooping to one side.  A is for arms. Signs include: ? Sudden weakness or numbness of the arm, usually on one side of the body.  S is for speech. Signs include: ? Trouble speaking (aphasia). ? Trouble understanding.  T is for time. ? These symptoms may represent a serious problem that is an emergency. Do not wait to see if the symptoms will go away. Get medical help right away. Call your local emergency services (911 in the [place] Do not drive yourself to the hospital.  Other signs of stroke may include: ? A sudden, severe headache with no known  cause. ? Nausea or vomiting. ? Seizure. Where to find more information For more information, visit:  American Stroke Association: www.strokeassociation.org  National Stroke Association: www.stroke.org Summary  You can prevent a stroke by eating healthy, exercising, not smoking, limiting alcohol intake, and managing any medical conditions you may have.  Do not use any products that contain nicotine or tobacco, such as cigarettes and e-cigarettes. If you need help quitting, ask your health care provider. It may also be helpful to avoid exposure to secondhand smoke.  Remember BEFAST for warning signs of stroke. Get help right away if you or a loved one has any of these signs. This information is not intended to replace advice given to you by your health care provider. Make sure you discuss any questions you have with your health care provider. Document Revised: 07/11/2017 Document Reviewed: 09/03/2016 Elsevier Patient Education  2020 [employer].

## 2020-05-10 NOTE — Progress Notes (Signed)
[facility] [address] [place]. [place] [address] [phone]       STROKE FOLLOW UP NOTE  [name] Date of Birth:  05-11-1946 Medical Record Number:  [id number]   Reason for Referral: stroke follow up    SUBJECTIVE:   CHIEF COMPLAINT:  Chief Complaint  Patient presents with  . Follow-up    rm 9  . Cerebrovascular Accident    HPI:   Today, 05/10/2020, Paul Vasquez returns for stroke follow-up unaccompanied.  He does report occasional right arm numbness sensation when fatigued and at times can be less steady but unable to appreciate actual weakness.  He has been staying active volunteering which he is currently enjoying.  He plans on moving to a townhouse in [place] on Tuesday.  Denies new or worsening stroke/TIA symptoms.  Remains on aspirin 81 mg daily and atorvastatin 40 mg daily without side effects.  Blood pressure today 126/72.  No further concerns at this time.     History provided for reference purposes only Initial visit 01/24/2020 JM: Paul Vasquez is being seen today, 01/24/2020, for hospital follow-up.  He reports initial improvement of right arm numbness and weakness after discharge but started to experience intermittent symptoms of right arm numbness again starting yesterday.  Reports similar symptoms in which he presented with, numbness present from elbow to fingertips.  Denies residual or reoccurring weakness or any other associated stroke symptoms.  Reports numbness typically worse in the morning and improves throughout the day.  He does report occasional band type headache which has been present since stroke without worsening.  He continues to have difficulty with depression after the loss of his wife approximately 1 year ago.  Denies worsening depression post stroke.  Completed 3 weeks DAPT and continues on aspirin alone without bleeding or bruising.  Continues on atorvastatin 40 mg daily without myalgias.  Blood pressure today  138/78.  He questions etiology of his stroke and preventative measures.  No further concerns at this time.  Stroke admission 12/29/2019 Paul Vasquez is a 74 y.o. 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  Initiated DAPT for 3 weeks and aspirin alone.  History of HTN not currently on antihypertensives with BP stable during admission.  LDL 103 and initiated atorvastatin 40 mg daily.  Other stroke risk factors include advanced age and history of EtOH use but no prior stroke history.  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  Evaluated by therapies and recommended discharge home with [facility].   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 Small vessel disease. Atrophy. Small sclerosis L superolateral orbital rim, ? mets.   MRI  Acute L posterior basal ganglia / PLIC infarct. Small vessel disease white matter and pons.   CTA head & neck Unremarkable. L ICA siphon mild  atherosclerosis .  Carotid Doppler  B ICA 1-39% stenosis, VAs antegrade   2D Echo EF 60-65%. No source of embolus   LDL 103  HgbA1c 6.1  Heparin 5000 units sq tid  for VTE prophylaxis  No antithrombotic prior to admission, now on aspirin 81 mg daily and clopidogrel 75 mg daily. Continue  DAPT x 3 weeks then aspirin alone.    Therapy recommendations:  home   Disposition:  home        ROS:   14 system review of systems performed and negative with exception of those listed in HPI  PMH:  Past Medical History:  Diagnosis Date  . Macular degeneration 02/2020   start  . Melanoma in situ of ear, right ([place])   . Prostate cancer ([place])   . Skin cancer     PSH:  Past Surgical History:  Procedure Laterality Date  . MOHS SURGERY    . PROSTATECTOMY      Social History:  Social History   Socioeconomic History  . Marital status: Widowed    Spouse name: Not on file  . Number of children: Not on file  . Years of education: Not on file  . Highest education level: Not on file  Occupational History    Comment: retired  Tobacco Use  . Smoking status: Never Smoker  . Smokeless tobacco: Never Used  Vaping Use  . Vaping Use: Never used  Substance and Sexual Activity  . Alcohol use: Not Currently  . Drug use: Never  . Sexual activity: Not Currently  Other Topics Concern  . Not on file  Social History Narrative  . Not on file   Social Determinants of Health   Financial Resource Strain:   . Difficulty of Paying Living Expenses: Not on file  Food Insecurity:   . Worried About Charity fundraiser in the Last Year: Not on file  . Ran Out of Food in the Last Year: Not on file  Transportation Needs:   . Lack of Transportation (Medical): Not on file  . Lack of Transportation (Non-Medical): Not on file  Physical Activity:   . Days of Exercise per Week: Not on file  . Minutes of Exercise per Session: Not on file  Stress:   . Feeling of Stress : Not on file  Social Connections:   . Frequency of Communication with Friends and Family: Not on file  . Frequency of Social Gatherings with Friends and Family: Not on file  . Attends Religious Services: Not on file  . Active Member of Clubs or Organizations: Not on file  . Attends Archivist Meetings: Not on  file  . Marital Status: Not on file  Intimate Partner Violence:   . Fear of Current or Ex-Partner: Not on file  . Emotionally Abused: Not on file  . Physically Abused: Not on file  . Sexually Abused: Not on file    Family History:  Family History  Problem Relation Age of Onset  . Multiple myeloma Father     Medications:   Current Outpatient Medications on File Prior to Visit  Medication Sig Dispense Refill  . aspirin EC 81 MG tablet Take 81 mg by mouth daily. Swallow whole.    . calcium-vitamin D (OSCAL WITH D) 500-200 MG-UNIT tablet Take 1 tablet by mouth.     . escitalopram (LEXAPRO) 10 MG tablet Take 10 mg by mouth daily.    . melatonin 1 MG TABS tablet Take 3 mg by mouth at  bedtime.    . NON FORMULARY Vitamin Code Grow Bone Calcium    . NON FORMULARY Emiliano Dyer Immune Stimulator daily    . NON FORMULARY Nature Sunshine Skeletal strength daily    . NON FORMULARY Growth factor daily    . NON FORMULARY Wheat grass daily    . NON FORMULARY Q-absorb Co-Q10    . NON FORMULARY Apex Energetics Adaptocrine    . NON FORMULARY Apex Energetics Gabatone    . NON FORMULARY Apex Energetics AD-Pro (bitamin A and D)    . NON FORMULARY [place] Prolent     . Omega-3 Fatty Acids (OMEGA-3 EPA FISH OIL PO) Take by mouth.    . Probiotic Product (SOLUBLE FIBER/PROBIOTICS PO) Take by mouth.    . vitamin C (ASCORBIC ACID) 500 MG tablet Take 500 mg by mouth 2 (two) times daily.     Marland Kitchen atorvastatin (LIPITOR) 40 MG tablet Take 1 tablet (40 mg total) by mouth daily. 90 tablet 0   No current facility-administered medications on file prior to visit.    Allergies:   Allergies  Allergen Reactions  . Amoxicillin Other (See Comments)    un  . Sulfamethoxazole Other (See Comments)    un      OBJECTIVE:  Physical Exam  Vitals:   05/10/20 0929  BP: 126/72  Pulse: 61  Weight: 136 lb (61.7 kg)  Height: 5' 7" (1.702 m)   Body mass index is 21.3 kg/m. No exam data present  General:  well developed, well nourished, very elderly Caucasian male, seated, in no evident distress Head: head normocephalic and atraumatic.   Neck: supple with no carotid or supraclavicular bruits Cardiovascular: regular rate and rhythm, no murmurs Musculoskeletal: no deformity Skin:  no rash/petichiae Vascular:  Normal pulses all extremities   Neurologic Exam Mental Status: Awake and fully alert. Fluent speech and language. Oriented to place and time. Recent and remote memory intact. Attention span, concentration and fund of knowledge appropriate. Mood and affect appropriate.  Cranial Nerves: Right eye prosthetic. Extraocular movements full without nystagmus. Visual fields full to confrontation. Hearing intact. Facial sensation intact.  tongue, palate moves normally and symmetrically.  Slight right nasolabial fold flattening. Motor: Normal bulk and tone. Normal strength in all tested extremity muscles. Sensory.:  Very slight decreased light touch sensation right upper extremity otherwise intact to pinprick , position and vibratory sensation.  Coordination: Rapid alternating movements normal in all extremities. Finger-to-nose and heel-to-shin performed accurately bilaterally. Gait and Station: Arises from chair without difficulty. Stance is normal. Gait demonstrates normal stride length and balance without use of assistive device.  Able to tandem walk and heel toe without difficulty. Reflexes: 1+ and symmetric. Toes downgoing.       ASSESSMENT/PLAN: Paul Vasquez is a 74 y.o. year old male presented with RLE weakness, RUE numbness, unsteady gait and foggy head on 12/29/2019 with stroke work-up revealing L posterior BG/PLIC infarct likely secondary to small vessel disease source. Vascular risk factors include HTN, HLD, advanced age and history of EtOH use.      1. L posterior BG/PLIC stroke:  a. Residual deficits: Intermittent RUE numbness and unsteadiness but does not limit or interfere  with daily activity.  Denies new or worsening stroke/TIA symptoms b. Continue aspirin 81 mg daily  and atorvastatin 40 mg daily for secondary stroke prevention. c. Discussed importance of close PCP follow-up for aggressive stroke risk factor management 2. HTN: BP goal<130/90.  Stable today.  Managed by PCP. 3. HLD: LDL goal<70.  Remains on atorvastatin managed by PCP   Follow up in 6 months or call earlier if needed   I spent 30 minutes of face-to-face and non-face-to-face time with patient.  This included previsit chart review, lab review, study review, order entry, electronic health record documentation, patient education regarding stroke, intermittent residual deficits, secondary stroke prevention measures and importance of managing stroke risk factors and answered all questions to patient satisfaction     Frann Rider, [facility]  [facility] [address] [place], [place] [address]  Phone [phone] Fax [phone] Note: This document was prepared with digital dictation and possible smart phrase technology. Any transcriptional errors that result from this process are unintentional.

## 2020-05-23 ENCOUNTER — Ambulatory Visit: Payer: Medicare PPO | Attending: Critical Care Medicine

## 2020-05-23 DIAGNOSIS — Z23 Encounter for immunization: Secondary | ICD-10-CM

## 2020-05-23 NOTE — Progress Notes (Signed)
   Covid-19 Vaccination Clinic  Name:  Paul Vasquez    MRN: [id number] DOB: June 12, 1946  05/23/2020  [name] was observed post Covid-19 immunization for 15 minutes without incident. He was provided with Vaccine Information Sheet and instruction to access the V-Safe system.   [name] was instructed to call 911 with any severe reactions post vaccine: Marland Kitchen Difficulty breathing  . Swelling of face and throat  . A fast heartbeat  . A bad rash all over body  . Dizziness and weakness

## 2020-09-11 ENCOUNTER — Encounter (INDEPENDENT_AMBULATORY_CARE_PROVIDER_SITE_OTHER): Payer: Self-pay | Admitting: Ophthalmology

## 2020-09-11 ENCOUNTER — Ambulatory Visit (INDEPENDENT_AMBULATORY_CARE_PROVIDER_SITE_OTHER): Payer: Medicare PPO | Admitting: Ophthalmology

## 2020-09-11 ENCOUNTER — Other Ambulatory Visit: Payer: Self-pay

## 2020-09-11 DIAGNOSIS — H40052 Ocular hypertension, left eye: Secondary | ICD-10-CM

## 2020-09-11 DIAGNOSIS — H2512 Age-related nuclear cataract, left eye: Secondary | ICD-10-CM

## 2020-09-11 DIAGNOSIS — H353122 Nonexudative age-related macular degeneration, left eye, intermediate dry stage: Secondary | ICD-10-CM | POA: Diagnosis not present

## 2020-09-11 NOTE — Progress Notes (Signed)
09/11/2020     CHIEF COMPLAINT Patient presents for Retina Follow Up (6 Month Dry AMD f\u OS. OCT and FP/Pt states vision has been stable. Denies new FOL and floaters.)   HISTORY OF PRESENT ILLNESS: Paul Vasquez is a 75 y.o. male who presents to the clinic today for:   HPI    Retina Follow Up    Patient presents with  Dry AMD.  In left eye.  Severity is moderate.  Duration of 6 months.  Since onset it is stable.  I, the attending physician,  performed the HPI with the patient and updated documentation appropriately. Additional comments: 6 Month Dry AMD f\u OS. OCT and FP Pt states vision has been stable. Denies new FOL and floaters.       Last edited by Tilda Franco on 09/11/2020 10:52 AM. (History)      Referring physician: [name], MD [place],  [place] [address]  HISTORICAL INFORMATION:   Selected notes from the MEDICAL RECORD NUMBER    Lab Results  Component Value Date   HGBA1C 6.1 (H) 12/30/2019     CURRENT MEDICATIONS: No current outpatient medications on file. (Ophthalmic Drugs)   No current facility-administered medications for this visit. (Ophthalmic Drugs)   Current Outpatient Medications (Other)  Medication Sig  . aspirin EC 81 MG tablet Take 81 mg by mouth daily. Swallow whole.  Marland Kitchen atorvastatin (LIPITOR) 40 MG tablet Take 1 tablet (40 mg total) by mouth daily.  . calcium-vitamin D (OSCAL WITH D) 500-200 MG-UNIT tablet Take 1 tablet by mouth.   . escitalopram (LEXAPRO) 10 MG tablet Take 10 mg by mouth daily.  . melatonin 1 MG TABS tablet Take 3 mg by mouth at bedtime.  . NON FORMULARY Vitamin Code Grow Bone Calcium  . NON FORMULARY Emiliano Dyer Immune Stimulator daily  . NON FORMULARY Nature Sunshine Skeletal strength daily  . NON FORMULARY Growth factor daily  . NON FORMULARY Wheat grass daily  . NON FORMULARY Q-absorb Co-Q10  . NON FORMULARY Apex Energetics Adaptocrine  . NON FORMULARY Apex Energetics Gabatone  .  NON FORMULARY Apex Energetics AD-Pro (bitamin A and D)  . NON FORMULARY Sanesco Prolent   . Omega-3 Fatty Acids (OMEGA-3 EPA FISH OIL PO) Take by mouth.  . Probiotic Product (SOLUBLE FIBER/PROBIOTICS PO) Take by mouth.  . vitamin C (ASCORBIC ACID) 500 MG tablet Take 500 mg by mouth 2 (two) times daily.    No current facility-administered medications for this visit. (Other)      REVIEW OF SYSTEMS:    ALLERGIES Allergies  Allergen Reactions  . Amoxicillin Other (See Comments)    un  . Sulfamethoxazole Other (See Comments)    un    PAST MEDICAL HISTORY Past Medical History:  Diagnosis Date  . Macular degeneration 02/2020   start  . Melanoma in situ of ear, right ([place])   . Prostate cancer ([place])   . Skin cancer    Past Surgical History:  Procedure Laterality Date  . MOHS SURGERY    . PROSTATECTOMY      FAMILY HISTORY Family History  Problem Relation Age of Onset  . Multiple myeloma Father     SOCIAL HISTORY Social History   Tobacco Use  . Smoking status: Never Smoker  . Smokeless tobacco: Never Used  Vaping Use  . Vaping Use: Never used  Substance Use Topics  . Alcohol use: Not Currently  . Drug use: Never  OPHTHALMIC EXAM:  Base Eye Exam    Visual Acuity (Snellen - Linear)      Right Left   Dist cc  20/25 +2       Tonometry (Tonopen, 10:56 AM)      Right Left   Pressure  8       Pupils      Dark Light Shape React APD   Right        Left 4 3 Round Brisk None       Visual Fields (Counting fingers)      Left Right    Full        Neuro/Psych    Oriented x3: Yes   Mood/Affect: Normal       Dilation    Left eye: 1.0% Mydriacyl, 2.5% Phenylephrine @ 10:56 AM        Slit Lamp and Fundus Exam    External Exam      Right Left   External Normal Normal       Slit Lamp Exam      Right Left   Lids/Lashes Normal Normal   Conjunctiva/Sclera White and quiet White and quiet   Cornea Clear Clear   Anterior Chamber Prothesis   Deep and quiet   Iris Round and reactive Round and reactive   Lens Clear 2+ Nuclear sclerosis   Anterior Vitreous Normal Normal       Fundus Exam      Right Left   Posterior Vitreous prosthesis Posterior vitreous detachment   Disc  Normal   C/D Ratio  0.4   Macula  Intermediate age related macular degeneration, no macular thickening, Retinal pigment epithelial mottling, no hemorrhage, no exudates, Hard drusen, Soft drusen   Vessels  Normal   Periphery  Normal, 78d, 25d,, no masses or tumors or nevi          IMAGING AND PROCEDURES  Imaging and Procedures for 09/11/20  OCT, Retina - OS - Left Eye       Quality was good. Scan locations included subfoveal. Central Foveal Thickness: 325. Progression has been stable. Findings include retinal drusen , no IRF, no SRF.   Notes No signs of active CNVM at this time.  Large drusenoid deposits are noted particularly temporally.  Incidental posterior vitreous detachment noted       Color Fundus Photography Optos - OS - Left Eye       Progression has no prior data. Disc findings include normal observations. Macula : drusen. Vessels : normal observations. Periphery : normal observations.   Notes Intermediate ARMD stable                ASSESSMENT/PLAN:  Ocular hypertension of left eye Follow-up as with Dr. Carolynn Sayers as scheduled  Intermediate stage nonexudative age-related macular degeneration of left eye No interval change, dry ARMD, use PreserVision daily  Cataract, nuclear sclerotic, left eye The nature of cataract was discussed with the patient as well as the elective nature of surgery. The patient was reassured that surgery at a later date does not put the patient at risk for a worse outcome. It was emphasized that the need for surgery is dictated by the patient's quality of life as influenced by the cataract. Patient was instructed to maintain close follow up with their general eye care doctor. As per Dr. Katy Fitch       ICD-10-CM   1. Intermediate stage nonexudative age-related macular degeneration of left eye  H35.3122 OCT, Retina - OS - Left Eye  Color Fundus Photography Optos - OS - Left Eye  2. Ocular hypertension of left eye  H40.052   3. Cataract, nuclear sclerotic, left eye  H25.12     1.  2.  3.  Ophthalmic Meds Ordered this visit:  No orders of the defined types were placed in this encounter.      Return in about 6 months (around 03/11/2021) for dilate, OS, OCT.  There are no Patient Instructions on file for this visit.   Explained the diagnoses, plan, and follow up with the patient and they expressed understanding.  Patient expressed understanding of the importance of proper follow up care.   Clent Demark Dorothie Wah M.D. Diseases & Surgery of the Retina and Vitreous [facility] [place] 09/11/20     Abbreviations: M myopia (nearsighted); A astigmatism; H hyperopia (farsighted); P presbyopia; Mrx spectacle prescription;  CTL contact lenses; OD right eye; OS left eye; OU both eyes  XT exotropia; ET esotropia; PEK punctate epithelial keratitis; PEE punctate epithelial erosions; DES dry eye syndrome; MGD meibomian gland dysfunction; ATs artificial tears; PFAT's preservative free artificial tears; [place] nuclear sclerotic cataract; PSC posterior subcapsular cataract; ERM epi-retinal membrane; PVD posterior vitreous detachment; RD retinal detachment; DM diabetes mellitus; DR diabetic retinopathy; NPDR non-proliferative diabetic retinopathy; PDR proliferative diabetic retinopathy; CSME clinically significant macular edema; DME diabetic macular edema; dbh dot blot hemorrhages; CWS cotton wool spot; POAG primary open angle glaucoma; C/D cup-to-disc ratio; HVF humphrey visual field; GVF goldmann visual field; OCT optical coherence tomography; IOP intraocular pressure; BRVO Branch retinal vein occlusion; CRVO central retinal vein occlusion; CRAO central retinal artery occlusion; BRAO branch retinal  artery occlusion; RT retinal tear; SB scleral buckle; PPV pars plana vitrectomy; VH Vitreous hemorrhage; PRP panretinal laser photocoagulation; IVK intravitreal kenalog; VMT vitreomacular traction; MH Macular hole;  NVD neovascularization of the disc; NVE neovascularization elsewhere; AREDS age related eye disease study; ARMD age related macular degeneration; POAG primary open angle glaucoma; EBMD epithelial/anterior basement membrane dystrophy; ACIOL anterior chamber intraocular lens; IOL intraocular lens; PCIOL posterior chamber intraocular lens; Phaco/IOL phacoemulsification with intraocular lens placement; [place] photorefractive keratectomy; LASIK laser assisted in situ keratomileusis; HTN hypertension; DM diabetes mellitus; COPD chronic obstructive pulmonary disease

## 2020-09-11 NOTE — Assessment & Plan Note (Signed)
No interval change, dry ARMD, use PreserVision daily

## 2020-09-11 NOTE — Assessment & Plan Note (Signed)
The nature of cataract was discussed with the patient as well as the elective nature of surgery. The patient was reassured that surgery at a later date does not put the patient at risk for a worse outcome. It was emphasized that the need for surgery is dictated by the patient's quality of life as influenced by the cataract. Patient was instructed to maintain close follow up with their general eye care doctor. As per Dr. Katy Fitch

## 2020-09-11 NOTE — Assessment & Plan Note (Signed)
Follow-up as with Dr. Carolynn Sayers as scheduled

## 2020-10-24 ENCOUNTER — Telehealth: Payer: Self-pay | Admitting: Radiation Oncology

## 2020-10-24 NOTE — Telephone Encounter (Signed)
Received an inbasket message from [employer], PA-C requesting I phone this patient to inquire further about the facial numbness he complained to Coral Ceo about earlier today. Phoned patient to inquire. No answer. Left voicemail message with my direct number requesting a callback with further details.

## 2020-10-26 NOTE — Telephone Encounter (Signed)
Mr. Campi phoned back after hours and left a voicemail on this RN's answering machine. The patient explained the numbness is across his cheeks, lips and upper jaw and changes in intensity. Patient explains the numbness began expanding approximately one month ago. Patient denies any additional new symptoms. Patient explains his dermatologist biopsied two separate areas and did not find any malignancy. The patient goes onto say, "the dermatologist referred me back to you all." Phone patient back. Confirmed I received his voicemail message and will pass it along to the providers. Patient understands this RN will phone him back with further directions after hearing back from the providers.

## 2020-11-01 ENCOUNTER — Telehealth: Payer: Self-pay | Admitting: *Deleted

## 2020-11-01 NOTE — Telephone Encounter (Signed)
Paul Vasquez phoned again today. He left a voicemail stating he hasn't heard back and is anxious. He continues to request an evaluation reference numbness across his face. Will forward message to providers and phone patient with their response.

## 2020-11-01 NOTE — Telephone Encounter (Signed)
CALLED PATIENT TO INFORM OF FU APPT. WITH DR. MANNING ON 11-08-20 @ 1 PM, LVM FOR A RETURN CALL

## 2020-11-01 NOTE — Telephone Encounter (Signed)
I was awaiting feedback from Dr. Tammi Klippel regarding his availability but have sent a request to the schedulers to offer the patient an in person follow up with Dr. Tammi Klippel at [facility] on Wednesday 11/08/20, per Dr. Tammi Klippel. They should be contacting him very soon. Thank you!

## 2020-11-06 ENCOUNTER — Ambulatory Visit: Payer: Medicare PPO | Admitting: Adult Health

## 2020-11-06 ENCOUNTER — Encounter: Payer: Self-pay | Admitting: Adult Health

## 2020-11-06 ENCOUNTER — Other Ambulatory Visit: Payer: Self-pay

## 2020-11-06 VITALS — BP 133/74 | HR 64 | Ht 68.0 in | Wt 137.0 lb

## 2020-11-06 DIAGNOSIS — I6381 Other cerebral infarction due to occlusion or stenosis of small artery: Secondary | ICD-10-CM | POA: Diagnosis not present

## 2020-11-06 NOTE — Patient Instructions (Signed)
Continue aspirin 81 mg daily  and atorvastatin 40 mg daily for secondary stroke prevention  Continue to follow up with PCP regarding cholesterol and blood pressure management  Maintain strict control of hypertension with blood pressure goal below 130/90 and cholesterol with LDL cholesterol (bad cholesterol) goal below 70 mg/dL.       Thank you for coming to see [place] at [facility]. I hope we have been able to provide you high quality care today.  You may receive a patient satisfaction survey over the next few weeks. We would appreciate your feedback and comments so that we may continue to improve ourselves and the health of our patients.

## 2020-11-06 NOTE — Progress Notes (Signed)
[facility] [address] [place]. [place] [address] [phone]       STROKE FOLLOW UP NOTE  [name] Date of Birth:  1945-10-14 Medical Record Number:  [id number]   Reason for Referral: stroke follow up    SUBJECTIVE:   CHIEF COMPLAINT:  Chief Complaint  Patient presents with  . Follow-up    Rm 14 alone Pt is doing well, has some R hand numbness every once in a while.     HPI:   Today, 10/29/2020, Paul Vasquez returns for 56-monthstroke follow-up.  Stable since prior visit without new stroke/TIA symptoms Reports residual occasional right back of hand numbness typically occurring with use after increased activity or exertion but overall improving since prior visit [facility] Reports compliance on aspirin 81 mg daily and atorvastatin 40 mg daily -denies associated side effects recent lab work obtained by PCP Dr. WJacelyn Gripper pt report (unable to personally view via epic) Blood pressure today 133/74  No new concerns at this time    History provided for reference purposes only Update 05/10/2020 JM: [name] for stroke follow-up unaccompanied.  He does report occasional right arm numbness sensation when fatigued and at times can be less steady but unable to appreciate actual weakness.  He has been staying active volunteering which he is currently enjoying.  He plans on moving to a townhouse in [facility] on Tuesday.  Denies new or worsening stroke/TIA symptoms.  Remains on aspirin 81 mg daily and atorvastatin 40 mg daily without side effects.  Blood pressure today 126/72.  No further concerns at this time.  Initial visit 01/24/2020 JM: Mr. CPaffordis being seen today, 01/24/2020, for hospital follow-up.  He reports initial improvement of right arm numbness and weakness after discharge but started to experience intermittent symptoms of right arm numbness again starting yesterday.  Reports  similar symptoms in which he presented with, numbness present from elbow to fingertips.  Denies residual or reoccurring weakness or any other associated stroke symptoms.  Reports numbness typically worse in the morning and improves throughout the day.  He does report occasional band type headache which has been present since stroke without worsening.  He continues to have difficulty with depression after the loss of his wife approximately 1 year ago.  Denies worsening depression post stroke.  Completed 3 weeks DAPT and continues on aspirin alone without bleeding or bruising.  Continues on atorvastatin 40 mg daily without myalgias.  Blood pressure today 138/78.  He questions etiology of his stroke and preventative measures.  No further concerns at this time.  Stroke admission 12/29/2019 Mr. CKelsie Vasquez a 75y.o. 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  Initiated DAPT for 3 weeks and aspirin alone.  History of HTN not currently on antihypertensives with BP stable during admission.  LDL 103 and initiated atorvastatin 40 mg daily.  Other stroke risk factors include advanced age and history of EtOH use but no prior stroke history.  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  Evaluated by therapies and recommended discharge home with [facility] PT.   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 Small vessel disease. Atrophy. Small sclerosis L superolateral orbital rim, ? mets.   MRI  Acute L posterior basal ganglia / PLIC infarct. Small vessel disease white matter and pons.   CTA head & neck Unremarkable. L ICA siphon mild atherosclerosis .  Carotid Doppler  B ICA 1-39% stenosis, VAs antegrade   2D Echo EF 60-65%. No source of embolus   LDL 103  HgbA1c 6.1  Heparin 5000 units sq tid  for VTE prophylaxis  No antithrombotic prior to admission, now on aspirin 81 mg daily and clopidogrel 75 mg daily. Continue DAPT x 3 weeks then aspirin alone.    Therapy recommendations:  home   Disposition:  home        ROS:   14 system review of systems performed and negative with exception of those listed in HPI  PMH:  Past Medical History:  Diagnosis Date  . Macular degeneration 02/2020   start  . Melanoma in situ of ear, right ([place])   . Prostate cancer ([place])   . Skin cancer     PSH:  Past Surgical History:  Procedure Laterality Date  . MOHS SURGERY    . PROSTATECTOMY      Social History:  Social History   Socioeconomic History  . Marital status: Widowed    Spouse name: Not on file  . Number of children: Not on file  . Years of education: Not on file  . Highest education level: Not on file  Occupational History    Comment: retired  Tobacco Use  . Smoking status: Never Smoker  . Smokeless tobacco: Never Used  Vaping Use  . Vaping Use: Never used  Substance and Sexual Activity  . Alcohol use: Not Currently  . Drug use: Never  . Sexual activity: Not Currently  Other Topics Concern  . Not on file  Social History Narrative  . Not on file   Social Determinants of Health   Financial Resource Strain: Not on file  Food Insecurity: Not on file  Transportation Needs: Not on file  Physical Activity: Not on file  Stress: Not on file   Social Connections: Not on file  Intimate Partner Violence: Not on file    Family History:  Family History  Problem Relation Age of Onset  . Multiple myeloma Father     Medications:   Current Outpatient Medications on File Prior to Visit  Medication Sig Dispense Refill  . aspirin EC 81 MG tablet Take 81 mg by mouth daily. Swallow whole.    Marland Kitchen atorvastatin (LIPITOR) 40 MG tablet Take 1 tablet (40 mg total) by mouth daily. 90 tablet 0  . calcium-vitamin D (OSCAL WITH D) 500-200 MG-UNIT tablet Take 1 tablet by mouth.     . escitalopram (LEXAPRO) 10 MG tablet Take 10 mg by mouth daily.    . melatonin 1 MG TABS tablet Take 2 mg by mouth at bedtime.    . NON FORMULARY Vitamin Code Grow Bone Calcium    . NON FORMULARY Emiliano Dyer Immune Stimulator daily    . NON FORMULARY Nature Sunshine Skeletal strength daily    .  NON FORMULARY Growth factor daily    . NON FORMULARY Wheat grass daily    . NON FORMULARY Q-absorb Co-Q10    . NON FORMULARY Apex Energetics Adaptocrine    . NON FORMULARY Apex Energetics Gabatone    . NON FORMULARY Apex Energetics AD-Pro (bitamin A and D)    . NON FORMULARY [place] Prolent     . Omega-3 Fatty Acids (OMEGA-3 EPA FISH OIL PO) Take by mouth.    . Probiotic Product (SOLUBLE FIBER/PROBIOTICS PO) Take by mouth.    . vitamin Paul (ASCORBIC ACID) 500 MG tablet Take 500 mg by mouth 2 (two) times daily.      No current facility-administered medications on file prior to visit.    Allergies:   Allergies  Allergen Reactions  . Amoxicillin Other (See Comments)    un  . Sulfamethoxazole Other (See Comments)    un      OBJECTIVE:  Physical Exam  Vitals:   11/06/20 0753  BP: 133/74  Pulse: 64  Weight: 137 lb (62.1 kg)  Height: '5\' 8"'  (1.727 m)   Body mass index is 20.83 kg/m. No exam data present  General: well developed, well nourished, very pleasant elderly Caucasian male, seated, in no evident distress Head: head normocephalic and atraumatic.    Neck: supple with no carotid or supraclavicular bruits Cardiovascular: regular rate and rhythm, no murmurs Musculoskeletal: no deformity Skin:  no rash/petichiae Vascular:  Normal pulses all extremities   Neurologic Exam Mental Status: Awake and fully alert. Fluent speech and language. Oriented to place and time. Recent and remote memory intact. Attention span, concentration and fund of knowledge appropriate. Mood and affect appropriate.  Cranial Nerves: Right eye prosthetic. OS: Extraocular movements full without nystagmus and visual fields full to confrontation. Hearing intact. Facial sensation intact. Face, tongue, palate moves normally and symmetrically.  Motor: Normal bulk and tone. Normal strength in all tested extremity muscles. Sensory.:  Light touch, vibratory and pinprick sensation intact in all tested extremities Coordination: Rapid alternating movements normal in all extremities. Finger-to-nose and heel-to-shin performed accurately bilaterally. Gait and Station: Arises from chair without difficulty. Stance is normal. Gait demonstrates normal stride length and balance without use of assistive device.  Able to tandem walk and heel toe without difficulty. Reflexes: 1+ and symmetric. Toes downgoing.       ASSESSMENT/PLAN: Jermal Dismuke is a 75 y.o. year old male presented with RLE weakness, RUE numbness, unsteady gait and foggy head on 12/29/2019 with stroke work-up revealing L posterior BG/PLIC infarct likely secondary to small vessel disease source. Vascular risk factors include HTN, HLD, advanced age and history of EtOH use.      1. L posterior BG/PLIC stroke:  a. Residual deficits: Overall recovered well with only occasional right back of hand numbness/tingling b. Continue aspirin 81 mg daily  and atorvastatin 40 mg daily for secondary stroke prevention. Paul. Discussed importance of close PCP follow-up for aggressive stroke risk factor management 2. HTN: BP goal<130/90.   Well-controlled monitor by PCP 3. HLD: LDL goal<70.  Remains on atorvastatin managed by PCP - reports recent lipid panel obtained by PCP which was satisfactory.  Request ongoing follow-up with PCP for atorvastatin prescribing and routine lipid panel monitoring   Overall stable from stroke standpoint routinely followed by PCP and recommend follow-up on an as-needed basis   CC:  [facility] provider: Dr. Timothy Lasso, Edwyna Shell, MD    I spent 30 minutes of face-to-face and non-face-to-face time with patient.  This included  previsit chart review, lab review, study review, order entry, electronic health record documentation, patient education regarding prior stroke including likely etiology, intermittent residual deficits, secondary stroke prevention measures and importance of managing stroke risk factors and answered all other questions to patient satisfaction   Frann Rider, [facility]  [facility] [address] [place], [place] [address]  Phone [phone] Fax [phone] Note: This document was prepared with digital dictation and possible smart phrase technology. Any transcriptional errors that result from this process are unintentional.

## 2020-11-06 NOTE — Progress Notes (Signed)
I agree with the above plan 

## 2020-11-08 ENCOUNTER — Other Ambulatory Visit: Payer: Self-pay | Admitting: Radiation Therapy

## 2020-11-08 ENCOUNTER — Ambulatory Visit
Admission: RE | Admit: 2020-11-08 | Discharge: 2020-11-08 | Disposition: A | Discharge status: Home / Self Care | Payer: Medicare PPO | Source: Ambulatory Visit | Attending: Radiation Oncology | Admitting: Radiation Oncology

## 2020-11-08 ENCOUNTER — Encounter: Payer: Self-pay | Admitting: Radiation Oncology

## 2020-11-08 ENCOUNTER — Other Ambulatory Visit: Payer: Self-pay

## 2020-11-08 ENCOUNTER — Other Ambulatory Visit: Payer: Self-pay | Admitting: Radiation Oncology

## 2020-11-08 VITALS — BP 125/74 | HR 68 | Temp 96.9°F | Resp 18 | Ht 68.0 in | Wt 136.1 lb

## 2020-11-08 DIAGNOSIS — Z923 Personal history of irradiation: Secondary | ICD-10-CM | POA: Diagnosis not present

## 2020-11-08 DIAGNOSIS — Z8673 Personal history of transient ischemic attack (TIA), and cerebral infarction without residual deficits: Secondary | ICD-10-CM | POA: Diagnosis not present

## 2020-11-08 DIAGNOSIS — Z7982 Long term (current) use of aspirin: Secondary | ICD-10-CM | POA: Insufficient documentation

## 2020-11-08 DIAGNOSIS — C44320 Squamous cell carcinoma of skin of unspecified parts of face: Secondary | ICD-10-CM

## 2020-11-08 DIAGNOSIS — R7303 Prediabetes: Secondary | ICD-10-CM | POA: Insufficient documentation

## 2020-11-08 DIAGNOSIS — M810 Age-related osteoporosis without current pathological fracture: Secondary | ICD-10-CM | POA: Insufficient documentation

## 2020-11-08 DIAGNOSIS — H3530 Unspecified macular degeneration: Secondary | ICD-10-CM | POA: Insufficient documentation

## 2020-11-08 DIAGNOSIS — Z79899 Other long term (current) drug therapy: Secondary | ICD-10-CM | POA: Diagnosis not present

## 2020-11-08 DIAGNOSIS — G510 Bell's palsy: Secondary | ICD-10-CM

## 2020-11-08 DIAGNOSIS — F321 Major depressive disorder, single episode, moderate: Secondary | ICD-10-CM | POA: Insufficient documentation

## 2020-11-08 DIAGNOSIS — R200 Anesthesia of skin: Secondary | ICD-10-CM

## 2020-11-08 DIAGNOSIS — M7751 Other enthesopathy of right foot: Secondary | ICD-10-CM | POA: Insufficient documentation

## 2020-11-08 DIAGNOSIS — G500 Trigeminal neuralgia: Secondary | ICD-10-CM

## 2020-11-08 DIAGNOSIS — R739 Hyperglycemia, unspecified: Secondary | ICD-10-CM | POA: Insufficient documentation

## 2020-11-08 NOTE — Progress Notes (Signed)
Patient returns today for follow up with Dr. Tammi Klippel. Patient's dermatologist encouraged him to follow up here reference unexplained numbness across right cheek, upper lip and upper jaw. Patient explains the numbness began extending across his face approximately one month after completing radiation therapy. Patient reports the upper jaw numbness seems to be progressing to his left side as well. Patient denies headache, dizziness, nausea, vomiting, tinnitus or diplopia. Patient does have a history of a stroke. Patient followed up recently with neurology and notes have been provided to Dr. Tammi Klippel. Patient verbalizes plans to follow up with PCP in 2 months and eye doctor in 3 months. Patient explains his dermatologist did two biopsies but neither show malignancy.   BP 125/74 (BP Location: Left Arm, Patient Position: Sitting)   Pulse 68   Temp (!) 96.9 F (36.1 C) (Temporal)   Resp 18   Ht 5\' 8"  (1.727 m)   Wt 136 lb 2 oz (61.7 kg)   SpO2 100%   BMI 20.70 kg/m  Wt Readings from Last 3 Encounters:  11/08/20 136 lb 2 oz (61.7 kg)  11/06/20 137 lb (62.1 kg)  05/10/20 136 lb (61.7 kg)

## 2020-11-08 NOTE — Progress Notes (Signed)
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 Squamous cell carcinoma of face  C44.320   2. Right facial numbness  R20.0     Interval Since Last Radiation:  16 months  05/18/2019 - 06/28/2019: Right Temple / 60 Gy in 30 fractions   Narrative:  The patient returns today for requested follow-up.  Patient's dermatologist encouraged him to follow up here reference unexplained numbness across right cheek, upper lip and upper jaw. Patient explains the numbness began extending across his face approximately one month after completing radiation therapy. Patient reports the upper jaw numbness seems to be progressing to his left side as well. Patient denies headache, dizziness, nausea, vomiting, tinnitus or diplopia. Patient does have a history of a stroke. Patient followed up recently with neurology and notes have been provided to Dr. Tammi Klippel. Patient verbalizes plans to follow up with PCP in 2 months and eye doctor in 3 months. Patient explains his dermatologist did two biopsies but neither show malignancy.  ALLERGIES:  is allergic to amoxicillin, sulfamethoxazole, and sulfamethoxazole-trimethoprim.  Meds: Current Outpatient Medications  Medication Sig Dispense Refill  . aspirin EC 81 MG tablet Take 81 mg by mouth daily. Swallow whole.    . calcium-vitamin D (OSCAL WITH D) 500-200 MG-UNIT tablet Take 1 tablet by mouth.     . escitalopram (LEXAPRO) 10 MG tablet Take 10 mg by mouth daily.    . melatonin 1 MG TABS tablet Take 2 mg by mouth at bedtime.    . NON FORMULARY Vitamin Code Grow Bone Calcium    . NON FORMULARY Emiliano Dyer Immune Stimulator daily    . NON FORMULARY Nature Sunshine  Skeletal strength daily    . NON FORMULARY Growth factor daily    . NON FORMULARY Wheat grass daily    . NON FORMULARY Q-absorb Co-Q10    . NON FORMULARY Apex Energetics Adaptocrine    . NON FORMULARY Apex Energetics Gabatone    . NON FORMULARY Apex Energetics AD-Pro (bitamin A and D)    . NON FORMULARY [place] Prolent     . Omega-3 Fatty Acids (OMEGA-3 EPA FISH OIL PO) Take by mouth.    . Probiotic Product (SOLUBLE FIBER/PROBIOTICS PO) Take by mouth.    . vitamin C (ASCORBIC ACID) 500 MG tablet Take 500 mg by mouth 2 (two) times daily.     Marland Kitchen atorvastatin (LIPITOR) 40 MG tablet Take 1 tablet (40 mg total) by mouth daily. 90 tablet 0   No current facility-administered medications for this encounter.    Physical Findings: The patient is in no acute distress. Patient is alert and oriented.  height is 5\' 8"  (1.727 m) and weight is 136 lb 2 oz (61.7 kg). His temporal temperature is 96.9 F (36.1 C) (abnormal). His blood pressure is 125/74 and his pulse is 68. His respiration is 18 and oxygen saturation is 100%. .  His right temple is notable for well healed skin incision, skin pallor and some subcutaneous atrophy with temporal wasting on the right.  He does have numbness to light touch in a V2 distribution.  Otherwise, no nearby palpable nodes or significant changes.  Lab Findings: Lab Results  Component  Value Date   WBC 4.0 12/31/2019   HGB 13.4 12/31/2019   HCT 40.1 12/31/2019   PLT 210 12/31/2019    Lab Results  Component Value Date   NA 140 12/31/2019   K 3.7 12/31/2019   CO2 24 12/31/2019   GLUCOSE 97 12/31/2019   BUN 18 12/31/2019   CREATININE 0.80 12/31/2019   BILITOT 0.9 12/30/2019   ALKPHOS 56 12/30/2019   AST 14 (L) 12/30/2019   ALT 13 12/30/2019   PROT 5.5 (L) 12/30/2019   ALBUMIN 3.4 (L) 12/30/2019   CALCIUM 8.6 (L) 12/31/2019   ANIONGAP 8 12/31/2019    Radiographic Findings: I personally reviewed patient head CT and brain MRI from Summer 2021.  Impression:   The patient has some late effects of radiation with no evidence of recurrence.  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  However, since his squamous cell carcinoma resected from the right temple was notable for perineural invasion, the prospect perineural tumor progression up the maxillary (V2) nerve must be considered.  He had brain MRI in May 2021.  This was not trigeminal protocol, but, my cursory review of the right foramen rotundum does not show overt enhancement of asymmetry.  Plan:  Today I spoke with the patient about the findings above.  I recommend that he undergo a dedicated trigeminal MRI.  If this is negative for V2 involvement, we will likely determine that his numbness is superficial and likely irreversible.  If V2 tumor involvement is seen on MRI, the patient may benefit from stereotactic radiotherapy to prevent further progression.  I spent 30 min in this encounter in total with prep work, film review, face-to-face time, examination, note authorship, ordering MRI and correspondence.  _____________________________________  Sheral Apley. Tammi Klippel, M.D.

## 2020-11-23 ENCOUNTER — Other Ambulatory Visit: Payer: Medicare PPO

## 2020-11-24 ENCOUNTER — Ambulatory Visit
Admission: RE | Admit: 2020-11-24 | Discharge: 2020-11-24 | Disposition: A | Discharge status: Home / Self Care | Payer: Medicare PPO | Source: Ambulatory Visit | Attending: Radiation Oncology | Admitting: Radiation Oncology

## 2020-11-24 DIAGNOSIS — G500 Trigeminal neuralgia: Secondary | ICD-10-CM

## 2020-11-24 DIAGNOSIS — R200 Anesthesia of skin: Secondary | ICD-10-CM

## 2020-11-24 IMAGING — MR MR HEAD WO/W CM
12 series · 48 of 48 positions shown · IV contrast (multihance)
Comparison: Brain MRI [DATE].

Trigeminal, Face MRI the same day reported separately.

CLINICAL DATA: 75-year-old male with history of right temple
cutaneous squamous cell carcinoma, developed right facial numbness
after completion of radiation. History of prostate cancer. Query
trigeminal neuropathy. SRS protocol.

EXAM:
MRI HEAD WITHOUT AND WITH CONTRAST
TECHNIQUE: Multiplanar, multiecho pulse sequences of the brain and surrounding
structures were obtained without and with intravenous contrast.
CONTRAST:  10mL MULTIHANCE GADOBENATE DIMEGLUMINE 529 MG/ML IV SOLN

[Series 3: FLAIR · sagittal · 3.0mm · 0.75mm/px · 3 of 39 slices shown (1 of 2)]
[im 1/39]
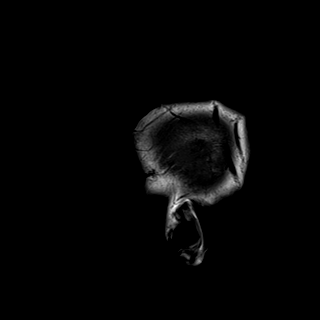
[im 20/39]
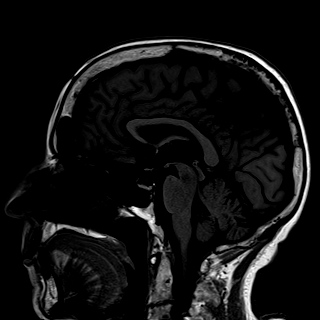
[im 39/39]
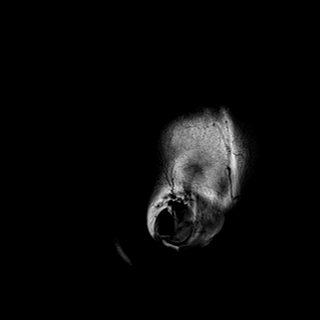

[Series 4: DWI · axial · 3.0mm · 1.50mm/px · z∈[-117,+43]mm · 4 of 84 slices shown (1 of 2)]
[im 1/84]
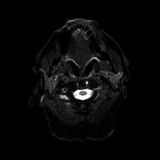
[im 28/84]
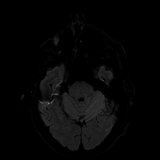
[im 56/84]
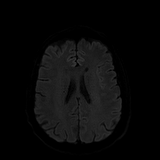
[im 84/84]
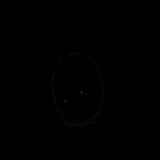

[Series 5: DWI · axial · 3.0mm · 1.50mm/px · z∈[-117,+43]mm · 2 of 42 slices shown (2 of 2)]
[im 1/42]
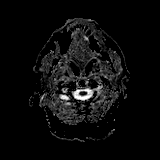
[im 42/42]
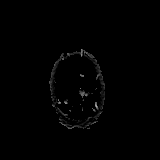

[Series 6: T2 · axial · 5.0mm · 0.57mm/px · 1 of 27 slices shown (1 of 2)]
[im 1/27]
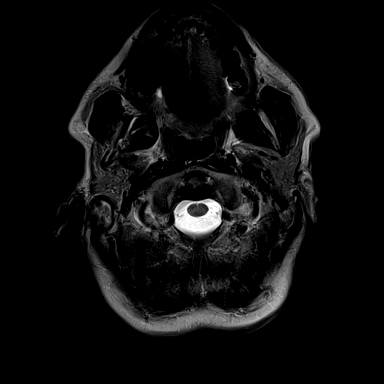

[Series 8: swi_images · axial · 1.5mm · 0.90mm/px · z∈[-105,+38]mm · 5 of 96 slices shown]
[im 1/96]
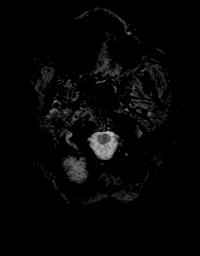
[im 24/96]
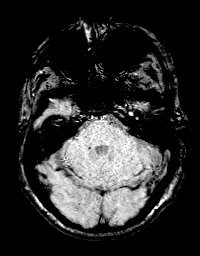
[im 48/96]
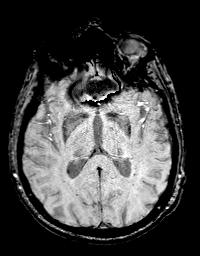
[im 72/96]
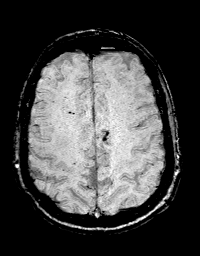
[im 96/96]
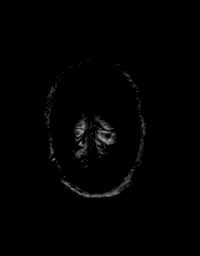

[Series 9: FLAIR · axial · 3.0mm · 0.86mm/px · z∈[-116,+49]mm · 3 of 56 slices shown (2 of 2)]
[im 1/56]
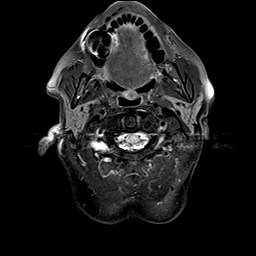
[im 28/56]
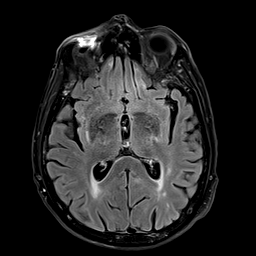
[im 56/56]
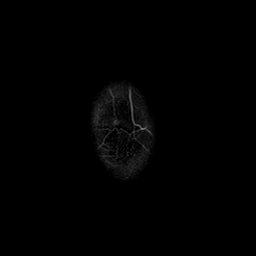

[Series 10: T2 · axial · non-contrast · 1.0mm · 0.86mm/px · z∈[-112,+44]mm · 8 of 160 slices shown (2 of 2)]
[im 1/160]
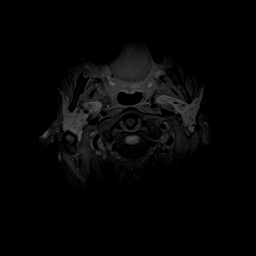
[im 23/160]
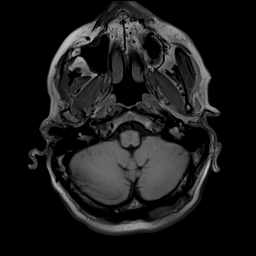
[im 46/160]
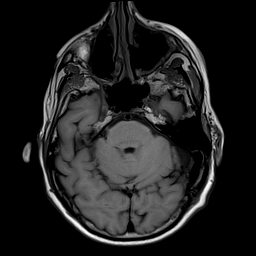
[im 69/160]
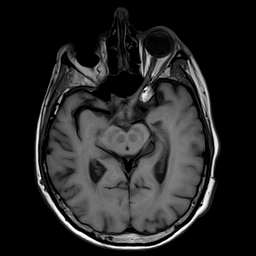
[im 91/160]
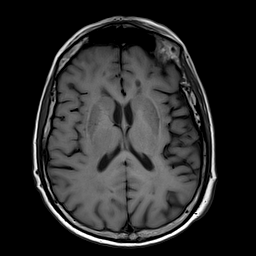
[im 114/160]
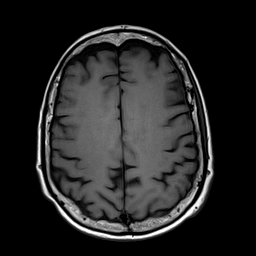
[im 137/160]
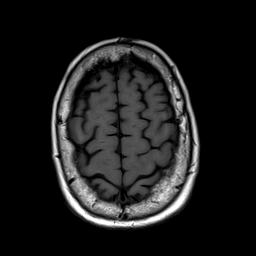
[im 160/160]
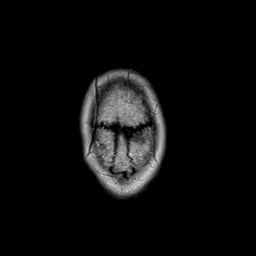

[Series 11: T2 post-contrast · coronal · 3.0mm · 0.86mm/px · 2 of 46 slices shown (1 of 2)]
[im 1/46]
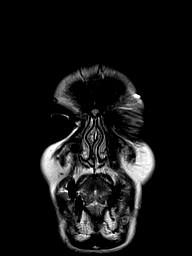
[im 46/46]
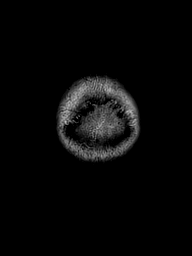

[Series 12: T2 post-contrast · axial · 1.0mm · 0.86mm/px · z∈[-119,+37]mm · 8 of 160 slices shown (2 of 2)]
[im 1/160]
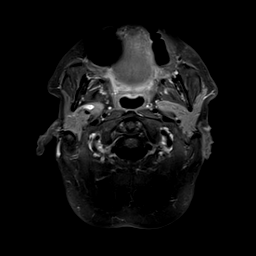
[im 23/160]
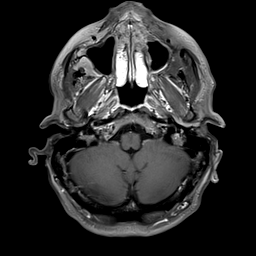
[im 46/160]
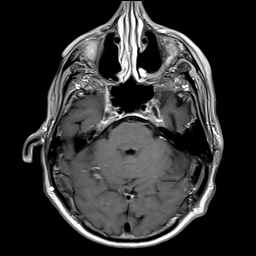
[im 69/160]
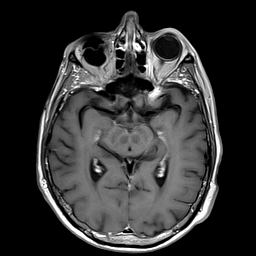
[im 91/160]
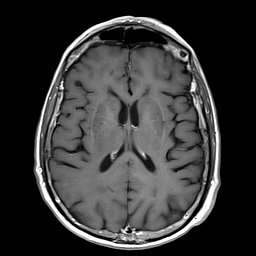
[im 114/160]
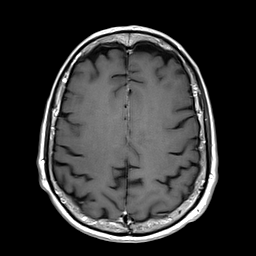
[im 137/160]
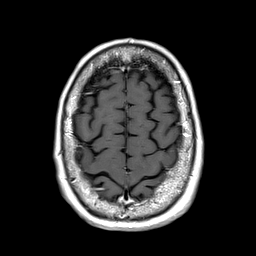
[im 160/160]
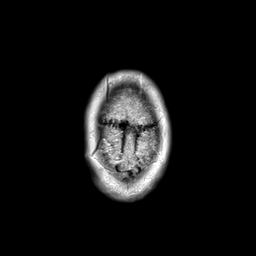

[Series 13: T1 post-contrast · axial · 1.0mm · 0.75mm/px · z∈[-124,+34]mm · 8 of 160 slices shown (1 of 2)]
[im 1/160]
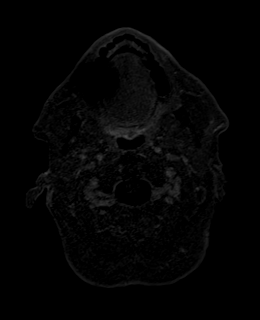
[im 23/160]
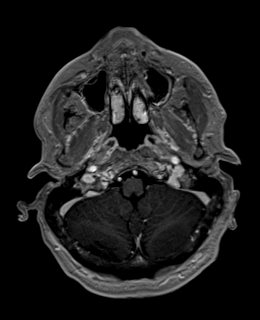
[im 46/160]
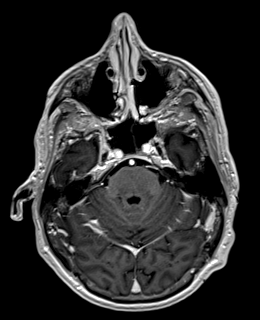
[im 69/160]
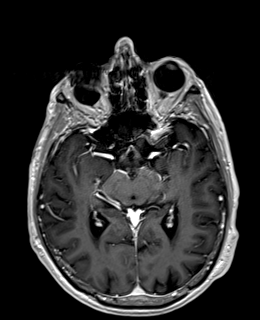
[im 91/160]
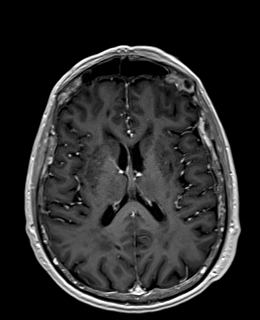
[im 114/160]
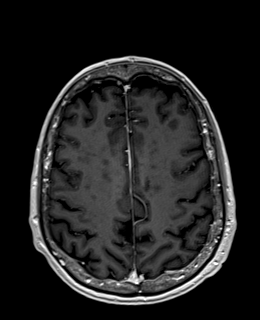
[im 137/160]
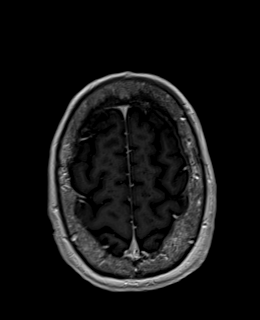
[im 160/160]
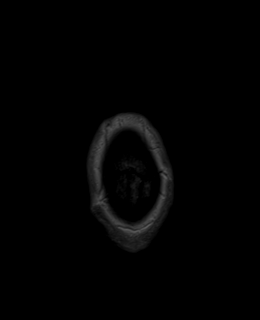

[Series 14: T1 post-contrast · coronal · 3.0mm · 0.57mm/px · 2 of 47 slices shown (2 of 2)]
[im 1/47]
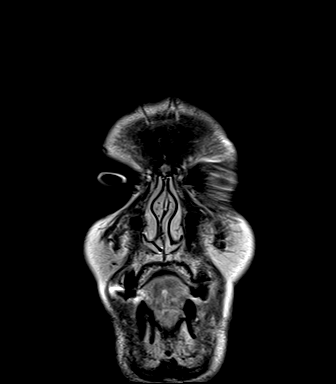
[im 47/47]
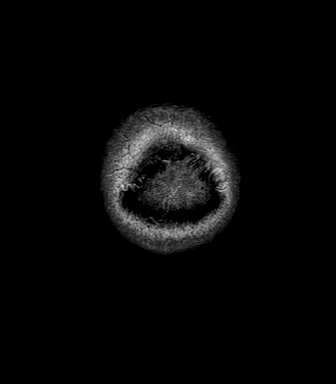

[Series 15: FLAIR post-contrast · sagittal · 3.0mm · 0.75mm/px · 2 of 39 slices shown]
[im 1/39]
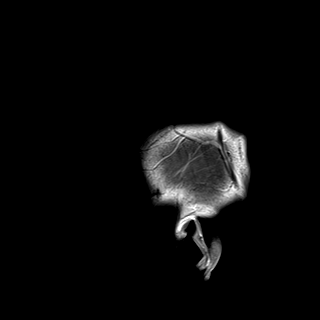
[im 39/39]
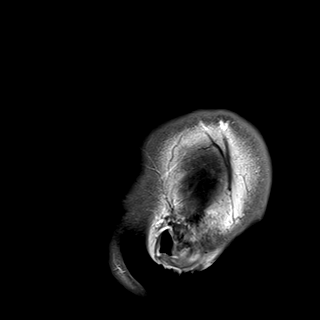

[48 of 48 positions shown; findings below may reference images not displayed]

FINDINGS: Brain: No restricted diffusion or evidence of acute infarction.
Chronically advanced T2 and FLAIR signal abnormality in the
bilateral cerebral white matter including the bilateral deep white
matter capsules. Chronic subcortical hemorrhage in the left
cingulate gyrus is stable. And SWI imaging today demonstrates
numerous other chronic microhemorrhages scattered in both cerebral
hemispheres. The brainstem is relatively spared, but there is
bilateral cerebellar hemisphere involvement. Bilateral deep gray
nuclei mildly involved. Superimposed mild chronic T2 heterogeneity
in the pons. No cortical encephalomalacia identified.

No midline shift, mass effect, evidence of mass lesion,
ventriculomegaly, extra-axial collection or acute intracranial
hemorrhage. Cervicomedullary junction and pituitary are within
normal limits.

Dedicated trigeminal nerve imaging is reported separately today.

No other abnormal enhancement or dural thickening identified.

Vascular: Major intracranial vascular flow voids are stable since
last year. Pneumatized right anterior clinoid process. On
postcontrast axial MPRAGE the major dural venous sinuses are
enhancing and appear to be patent.

Skull and upper cervical spine: Stable visible cervical spine
degeneration. Visualized bone marrow signal is within normal limits.
No suspicious marrow lesion identified.

Sinuses/Orbits: Chronic right orbit prosthesis. Negative left orbits
soft tissues. Stable chronic paranasal sinus disease, well aerated
sinuses overall.

Other: Mastoids remain clear. Grossly normal visible internal
auditory structures. Dedicated trigeminal nerve imaging is reported
separately today.
IMPRESSION: 1. See dedicated Trigeminal / Face MRI reported separately.
2. No other metastatic disease or acute intracranial abnormality
identified.
3. Chronically advanced small vessel disease, and numerous chronic
microhemorrhage throughout the brain on SWI today, raising the
possibility of underlying Amyloid Angiopathy.

## 2020-11-24 IMAGING — MR MR FACE/TRIGEMINAL WO/W CM
9 of 10 series · 36 of 48 positions shown · IV contrast (12 mL Multihance)
Comparison: Brain MRI today reported separately.

CLINICAL DATA: 75-year-old male with history of right temple
cutaneous squamous cell carcinoma, developed right facial numbness
after completion of radiation. History of prostate cancer. Query
trigeminal neuropathy. SRS protocol.

EXAM:
MRI FACE TRIGEMINAL WITHOUT AND WITH CONTRAST
TECHNIQUE: Multiplanar, multiecho pulse sequences of the face and surrounding
structures, including thin slice imaging of the course of the
Trigeminal Nerves, were obtained both before and after
administration of intravenous contrast.
CONTRAST:  10mL MULTIHANCE GADOBENATE DIMEGLUMINE 529 MG/ML IV SOLN

[Series 5: T1 · sagittal · 4.0mm · 0.70mm/px · 2 of 30 slices shown (1 of 5)]
[im 1/30]
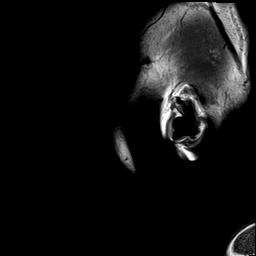
[im 30/30]
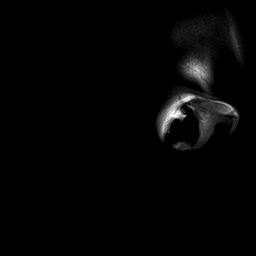

[Series 6: T2 · coronal · 3.0mm · 0.70mm/px · 3 of 40 slices shown]
[im 1/40]
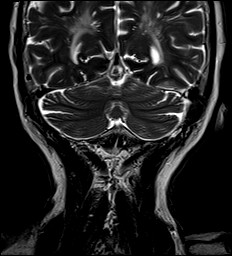
[im 20/40]
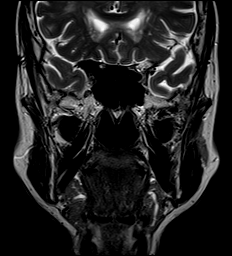
[im 40/40]
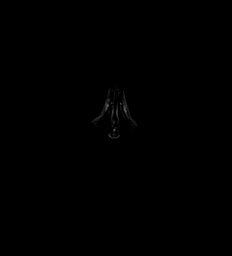

[Series 7: STIR · axial · 3.0mm · 0.62mm/px · z∈[-180,-2]mm · 3 of 47 slices shown]
[im 1/47]
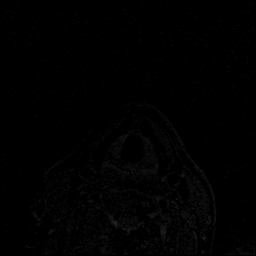
[im 24/47]
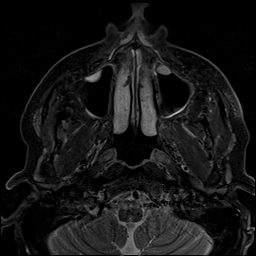
[im 47/47]
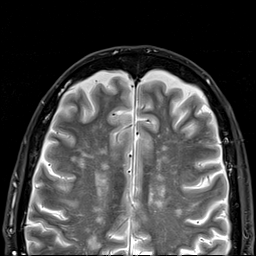

[Series 9: T1 · axial · 3.0mm · 0.31mm/px · z∈[-116,-21]mm · 2 of 30 slices shown (2 of 5)]
[im 1/30]
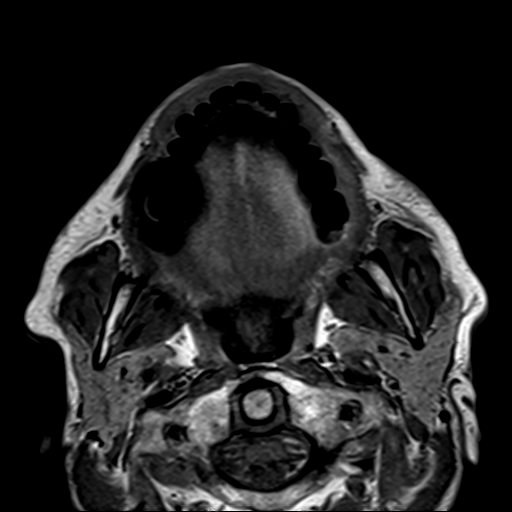
[im 30/30]
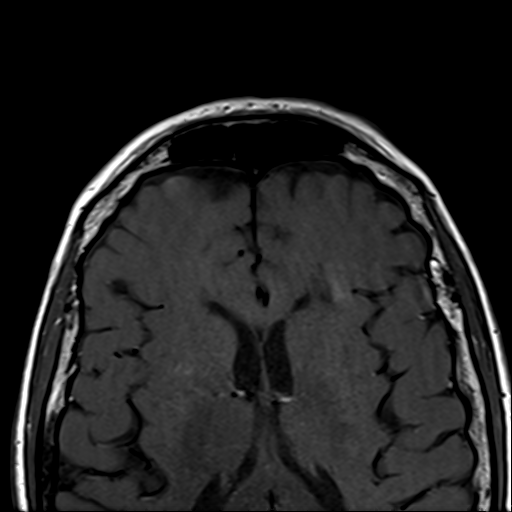

[Series 10: T1 · coronal · 3.0mm · 0.35mm/px · 4 of 49 slices shown (3 of 5)]
[im 1/49]
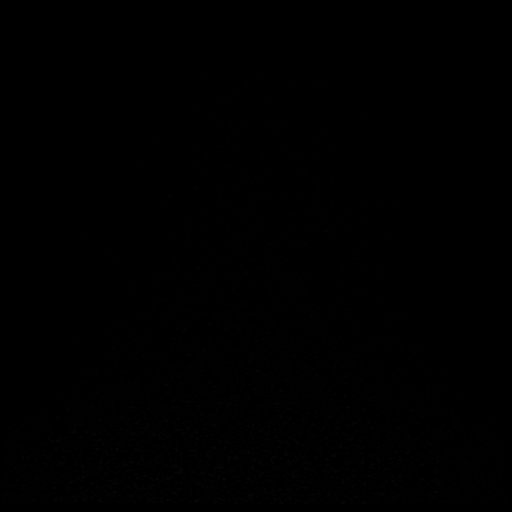
[im 17/49]
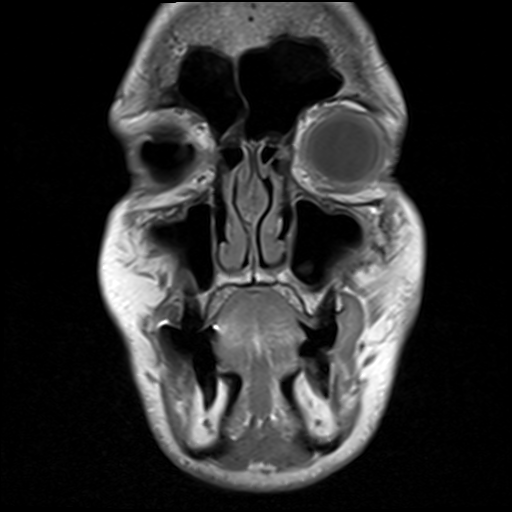
[im 33/49]
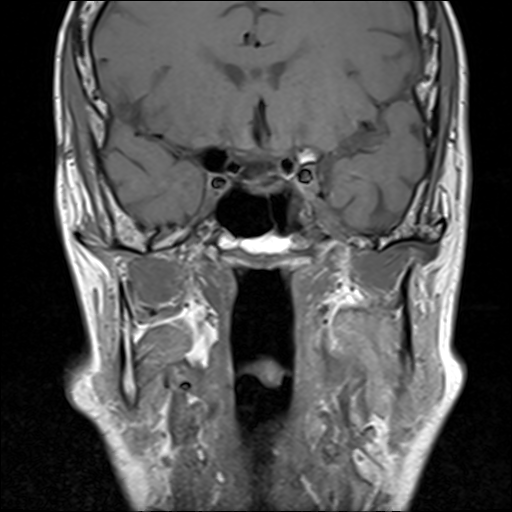
[im 49/49]
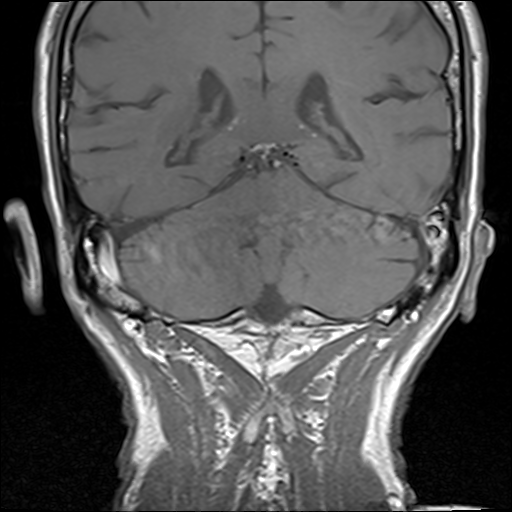

[Series 11: T1 fat-sat post-contrast · coronal · 3.0mm · 0.35mm/px · 4 of 50 slices shown (1 of 2)]
[im 1/50]
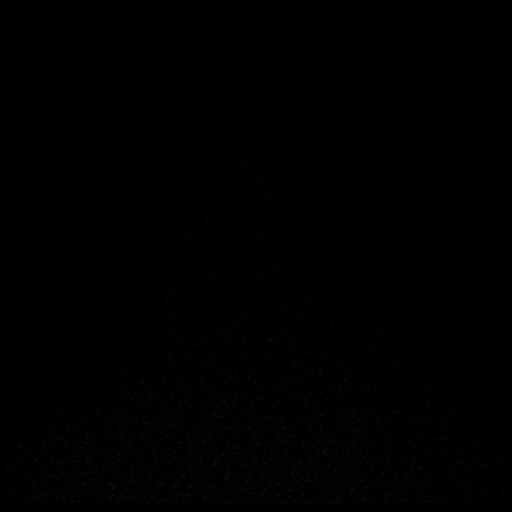
[im 17/50]
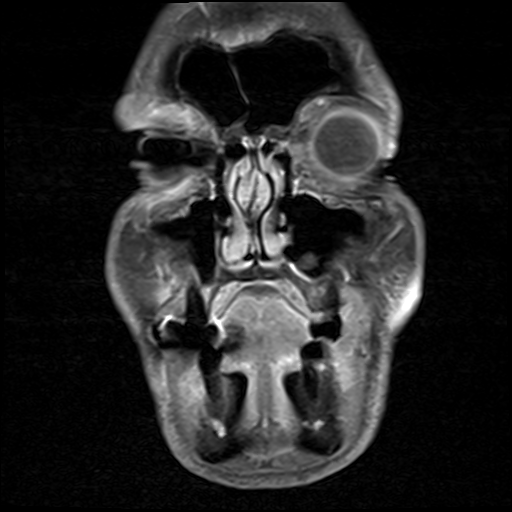
[im 33/50]
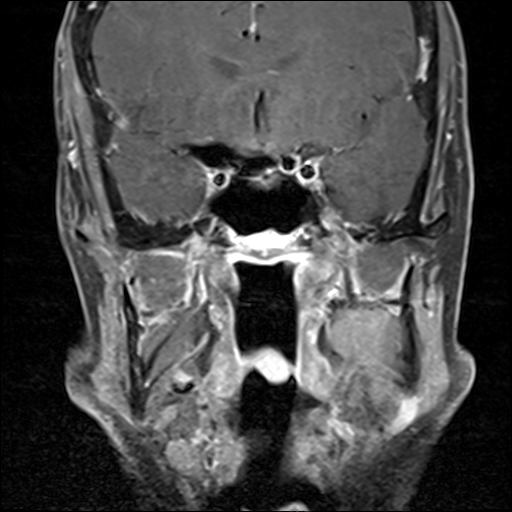
[im 50/50]
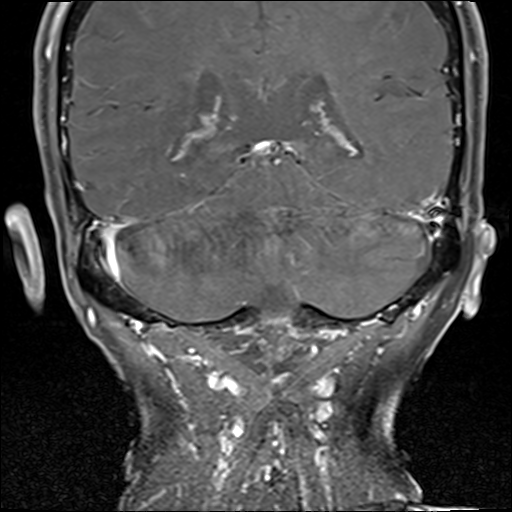

[Series 12: T1 fat-sat post-contrast · axial · 3.0mm · 0.31mm/px · z∈[-116,-21]mm · 2 of 30 slices shown (2 of 2)]
[im 1/30]
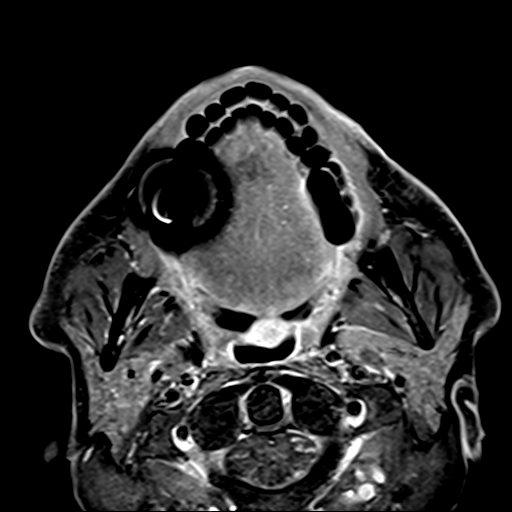
[im 30/30]
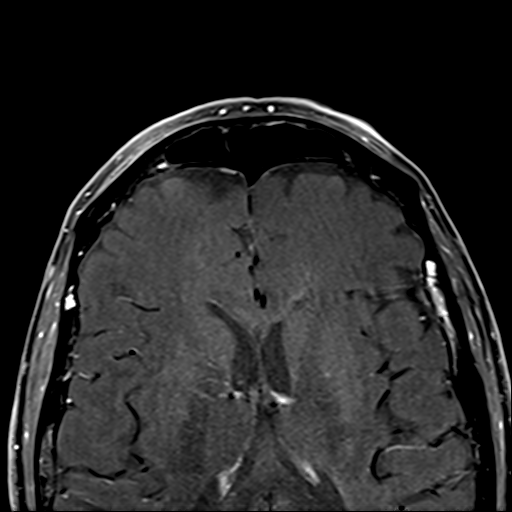

[Series 13: T1 · axial · 1.0mm · 0.90mm/px · z∈[-78,+59]mm · 8 of 144 slices shown (4 of 5)]
[im 1/144]
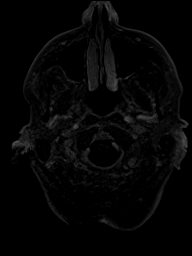
[im 29/144]
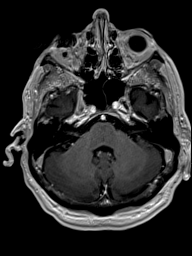
[im 43/144]
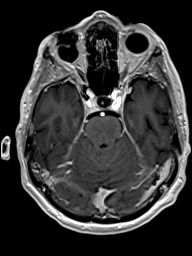
[im 58/144]
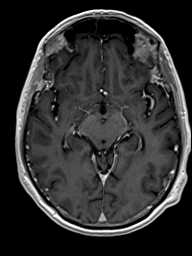
[im 86/144]
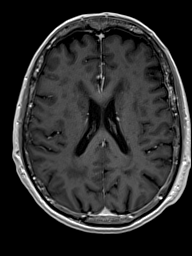
[im 101/144]
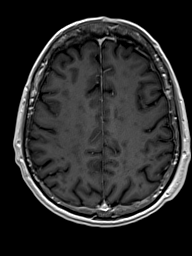
[im 115/144]
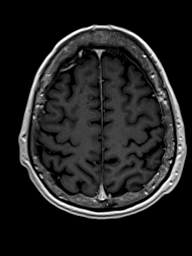
[im 144/144]
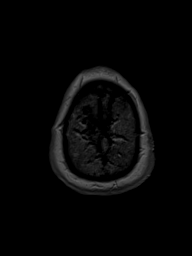

[Series 14: T1 · axial · 1.0mm · 0.90mm/px · z∈[-177,-3]mm · 8 of 176 slices shown (5 of 5)]
[im 1/176]
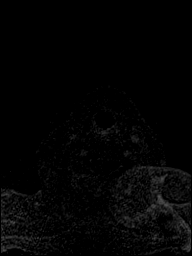
[im 30/176]
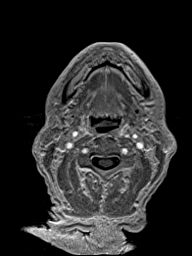
[im 59/176]
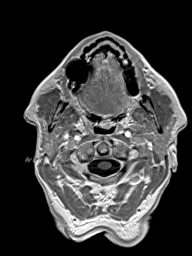
[im 73/176]
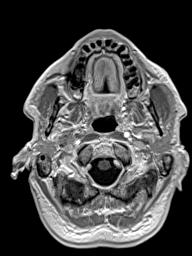
[im 103/176]
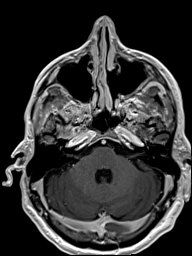
[im 117/176]
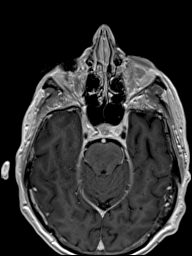
[im 146/176]
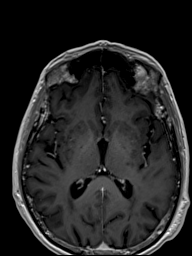
[im 176/176]
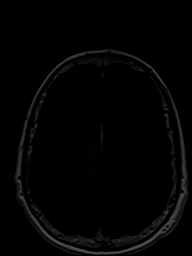

[36 of 48 positions shown; findings below may reference images not displayed]

FINDINGS: Brain findings are reported separately today. Mild to moderate T2
and STIR heterogeneity redemonstrated in the pons. No brainstem
enlargement or abnormal enhancement.

Cisternal 5th nerve segments are identified on T2 weighted images
(series 8, image 35) appearing symmetric, and somewhat diminutive on
series 6, image 12. Left Meckel's cave appears normal.

However, the right Meckel's cave is abnormally enhancing in a
heterogeneous fashion (series 11, image 36 and series 12, image 17)
with abnormal enhancement tracking anteriorly along the right V2
trunk which appears asymmetrically thickened on series 6, image 20.
However, the right infraorbital nerve appears to remain normal.

But furthermore, there is subtle asymmetric enhancement along the
course of the right cisternal 5th nerve just posterior to Meckel's
cave (series 11 images 29 and 40, and also series 12, image 17).

The V3 cranial nerves appear more symmetric and within normal
limits. Inferior alveolar nerves at the posterior mandible appears
symmetric and within normal limits. Mandible marrow signal is within
normal limits.

The remainder of the cavernous sinus appears within normal limits.
There is asymmetric pneumatization of the right anterior clinoid
process. Chronic postoperative changes in the right orbit with right
globe prosthesis. No intraorbital soft tissue mass is evident.

Bone marrow signal at the skull base seems to remain normal. No
discrete osseous lesion identified.

Normal cerebellopontine angles. Normal bilateral cisternal and
intracanalicular 7th and 8th cranial nerve segments. Symmetric T2
signal in the bilateral cochlea and vestibular structures with no
abnormal 7th or 8th cranial nerve enhancement identified. Mastoids
are clear. Stylomastoid foramina appear to be normal.

No discrete facial soft tissue mass identified.
IMPRESSION: 1. Abnormally thickened and heterogeneously enhancing of the Right
Meckel's cave and the Right V2 trunk.
Although not specific this has a typical appearance of perineural
tumor spread, and there is suspected early involvement of the
anterior cisternal 5th nerve segment also.
But the right infraorbital nerve and right V3 trunk and its branches
appear spared.
And no facial or bony skull base tumor is identified.

2. Normal left side trigeminal nerve imaging.

3. See also Brain MRI findings today reported separately.

## 2020-11-24 MED ORDER — GADOBENATE DIMEGLUMINE 529 MG/ML IV SOLN
10.0000 mL | Freq: Once | INTRAVENOUS | Status: AC | PRN
  Administered 2020-11-24: 10 mL via INTRAVENOUS

## 2020-11-27 ENCOUNTER — Other Ambulatory Visit: Payer: Self-pay | Admitting: Radiation Therapy

## 2020-11-27 ENCOUNTER — Telehealth: Payer: Self-pay | Admitting: *Deleted

## 2020-11-27 ENCOUNTER — Inpatient Hospital Stay: Payer: Medicare PPO | Attending: Radiation Oncology

## 2020-11-27 DIAGNOSIS — G529 Cranial nerve disorder, unspecified: Secondary | ICD-10-CM

## 2020-11-27 NOTE — Telephone Encounter (Signed)
RETURNED PATIENT'S PHONE CALL, LVM  

## 2020-11-29 NOTE — Progress Notes (Signed)
Has armband been applied?  Yes.    Does patient have an allergy to IV contrast dye?: No.   Has patient ever received premedication for IV contrast dye?: No.   Does patient take metformin?: No.  If patient does take metformin when was the last dose: n/a  Date of lab work: 12/03/2020 BUN: 20 CR: 0.77  IV site: antecubital right, condition no redness  Has IV site been added to flowsheet?  Yes.

## 2020-11-30 ENCOUNTER — Ambulatory Visit
Admission: RE | Admit: 2020-11-30 | Discharge: 2020-11-30 | Disposition: A | Discharge status: Home / Self Care | Payer: Medicare PPO | Source: Ambulatory Visit | Attending: Radiation Oncology | Admitting: Radiation Oncology

## 2020-11-30 ENCOUNTER — Other Ambulatory Visit: Payer: Self-pay

## 2020-11-30 DIAGNOSIS — I6782 Cerebral ischemia: Secondary | ICD-10-CM | POA: Diagnosis not present

## 2020-11-30 DIAGNOSIS — G529 Cranial nerve disorder, unspecified: Secondary | ICD-10-CM

## 2020-11-30 DIAGNOSIS — Z510 Encounter for antineoplastic radiation therapy: Secondary | ICD-10-CM | POA: Insufficient documentation

## 2020-11-30 DIAGNOSIS — Z79899 Other long term (current) drug therapy: Secondary | ICD-10-CM | POA: Diagnosis not present

## 2020-11-30 DIAGNOSIS — C44320 Squamous cell carcinoma of skin of unspecified parts of face: Secondary | ICD-10-CM | POA: Diagnosis present

## 2020-11-30 DIAGNOSIS — Z7982 Long term (current) use of aspirin: Secondary | ICD-10-CM | POA: Diagnosis not present

## 2020-11-30 DIAGNOSIS — Z8546 Personal history of malignant neoplasm of prostate: Secondary | ICD-10-CM | POA: Diagnosis not present

## 2020-11-30 DIAGNOSIS — C7259 Malignant neoplasm of other cranial nerves: Secondary | ICD-10-CM | POA: Insufficient documentation

## 2020-11-30 DIAGNOSIS — C7250 Malignant neoplasm of unspecified cranial nerve: Secondary | ICD-10-CM

## 2020-11-30 DIAGNOSIS — R200 Anesthesia of skin: Secondary | ICD-10-CM

## 2020-11-30 LAB — BUN & CREATININE (CHCC)
BUN: 20 mg/dL (ref 8–23)
Creatinine: 0.77 mg/dL (ref 0.61–1.24)
GFR, Estimated: >60 mL/min (ref >60)

## 2020-11-30 MED ORDER — SODIUM CHLORIDE 0.9% FLUSH
10.0000 mL | INTRAVENOUS | Status: AC
Start: 2020-11-30 — End: 2020-11-30
  Administered 2020-11-30: 10 mL via INTRAVENOUS

## 2020-11-30 NOTE — Progress Notes (Addendum)
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 Squamous cell carcinoma of face  C44.320   2. 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  He had an MRI that shows tumor involvement of trigeminal nerve extending into Meckel's cave.                              ALLERGIES:  is allergic to amoxicillin, sulfamethoxazole, and sulfamethoxazole-trimethoprim.  Meds: Current Outpatient Medications  Medication Sig Dispense Refill  . aspirin EC 81 MG tablet Take 81 mg by mouth daily. Swallow whole.    Marland Kitchen atorvastatin (LIPITOR) 40 MG tablet Take 1 tablet (40 mg total) by mouth daily. 90 tablet 0  . calcium-vitamin D (OSCAL WITH D) 500-200 MG-UNIT tablet Take 1 tablet by mouth.     . escitalopram (LEXAPRO) 10 MG tablet Take 10 mg by mouth daily.    . melatonin 1 MG TABS tablet Take 2 mg by mouth at bedtime.    . NON FORMULARY Vitamin Code Grow Bone Calcium    . NON FORMULARY Emiliano Dyer Immune Stimulator daily    . NON FORMULARY Nature Sunshine Skeletal strength daily    . NON FORMULARY Growth factor daily    . NON FORMULARY Wheat grass daily    . NON FORMULARY Q-absorb Co-Q10    . NON FORMULARY Apex Energetics Adaptocrine    . NON FORMULARY Apex Energetics Gabatone    . NON FORMULARY Apex Energetics AD-Pro (bitamin A and D)    . NON FORMULARY [place] Prolent     . Omega-3 Fatty Acids (OMEGA-3 EPA FISH OIL PO) Take by mouth.    . Probiotic Product (SOLUBLE  FIBER/PROBIOTICS PO) Take by mouth.    . vitamin C (ASCORBIC ACID) 500 MG tablet Take 500 mg by mouth 2 (two) times daily.      No current facility-administered medications for this encounter.    Physical Findings: The patient is in no acute distress. Patient is alert and oriented.  height is 5\' 8"  (1.727 m) and weight is 137 lb (62.1 kg). His temperature is 97.8 F (36.6 C). His blood pressure is 139/69 and his pulse is 63. His respiration is 18 and oxygen saturation is 100%. .  No significant changes.  Lab Findings: Lab Results  Component Value Date   WBC 4.0 12/31/2019   HGB 13.4 12/31/2019   HCT 40.1 12/31/2019   PLT 210 12/31/2019    Lab Results  Component Value Date   NA 140 12/31/2019   K 3.7 12/31/2019   CO2 24 12/31/2019   GLUCOSE 97 12/31/2019   BUN 20 11/30/2020   CREATININE 0.77 11/30/2020   BILITOT 0.9 12/30/2019   ALKPHOS 56 12/30/2019   AST 14 (L) 12/30/2019   ALT 13 12/30/2019   PROT 5.5 (L) 12/30/2019   ALBUMIN 3.4 (L) 12/30/2019   CALCIUM 8.6 (L) 12/31/2019  ANIONGAP 8 12/31/2019    Radiographic Findings: MR Brain W Wo Contrast  Result Date: 11/25/2020 CLINICAL DATA:  75 year old male with history of right temple cutaneous squamous cell carcinoma, developed right facial numbness after completion of radiation. History of prostate cancer. Query trigeminal neuropathy. SRS protocol. EXAM: MRI HEAD WITHOUT AND WITH CONTRAST TECHNIQUE: Multiplanar, multiecho pulse sequences of the brain and surrounding structures were obtained without and with intravenous contrast. CONTRAST:  78mL MULTIHANCE GADOBENATE DIMEGLUMINE 529 MG/ML IV SOLN COMPARISON:  Brain MRI 12/30/2019. Trigeminal, Face MRI the same day reported separately. FINDINGS: Brain: No restricted diffusion or evidence of acute infarction. Chronically advanced T2 and FLAIR signal abnormality in the bilateral cerebral white matter including the bilateral deep white matter capsules. Chronic subcortical  hemorrhage in the left cingulate gyrus is stable. And SWI imaging today demonstrates numerous other chronic microhemorrhages scattered in both cerebral hemispheres. The brainstem is relatively spared, but there is bilateral cerebellar hemisphere involvement. Bilateral deep gray nuclei mildly involved. Superimposed mild chronic T2 heterogeneity in the pons. No cortical encephalomalacia identified. No midline shift, mass effect, evidence of mass lesion, ventriculomegaly, extra-axial collection or acute intracranial hemorrhage. Cervicomedullary junction and pituitary are within normal limits. Dedicated trigeminal nerve imaging is reported separately today. No other abnormal enhancement or dural thickening identified. Vascular: Major intracranial vascular flow voids are stable since last year. Pneumatized right anterior clinoid process. On postcontrast axial MPRAGE the major dural venous sinuses are enhancing and appear to be patent. Skull and upper cervical spine: Stable visible cervical spine degeneration. Visualized bone marrow signal is within normal limits. No suspicious marrow lesion identified. Sinuses/Orbits: Chronic right orbit prosthesis. Negative left orbits soft tissues. Stable chronic paranasal sinus disease, well aerated sinuses overall. Other: Mastoids remain clear. Grossly normal visible internal auditory structures. Dedicated trigeminal nerve imaging is reported separately today. IMPRESSION: 1. See dedicated Trigeminal / Face MRI reported separately. 2. No other metastatic disease or acute intracranial abnormality identified. 3. 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 Electronically Signed   By: Genevie Ann M.D.   On: 11/25/2020 11:50   MR FACE/TRIGEMINAL WO/W CM  Result Date: 11/25/2020 CLINICAL DATA:  75 year old male with history of right temple cutaneous squamous cell  carcinoma, developed right facial numbness after completion of radiation. History of prostate cancer. Query trigeminal neuropathy. SRS protocol. EXAM: MRI FACE TRIGEMINAL WITHOUT AND WITH CONTRAST TECHNIQUE: Multiplanar, multiecho pulse sequences of the face and surrounding structures, including thin slice imaging of the course of the Trigeminal Nerves, were obtained both before and after administration of intravenous contrast. CONTRAST:  14mL MULTIHANCE GADOBENATE DIMEGLUMINE 529 MG/ML IV SOLN COMPARISON:  Brain MRI today reported separately. FINDINGS: Brain findings are reported separately today. Mild to moderate T2 and STIR heterogeneity redemonstrated in the pons. No brainstem enlargement or abnormal enhancement. Cisternal 5th nerve segments are identified on T2 weighted images (series 8, image 35) appearing symmetric, and somewhat diminutive on series 6, image 12. Left Meckel's cave appears normal. However, the right Meckel's cave is abnormally enhancing in a heterogeneous fashion (series 11, image 36 and series 12, image 17) with abnormal enhancement tracking anteriorly along the right V2 trunk which appears asymmetrically thickened on series 6, image 20. However, the right infraorbital nerve appears to remain normal. But furthermore, there is subtle asymmetric enhancement along the course of the right cisternal 5th nerve just posterior to Meckel's cave (series 11 images 29 and 40, and also  series 12, image 17). The V3 cranial nerves appear more symmetric and within normal limits. Inferior alveolar nerves at the posterior mandible appears symmetric and within normal limits. Mandible marrow signal is within normal limits. The remainder of the cavernous sinus appears within normal limits. There is asymmetric pneumatization of the right anterior clinoid process. Chronic postoperative changes in the right orbit with right globe prosthesis. No intraorbital soft tissue mass is evident. Bone marrow signal at the  skull base seems to remain normal. No discrete osseous lesion identified. Normal cerebellopontine angles. Normal bilateral cisternal and intracanalicular 7th and 8th cranial nerve segments. Symmetric T2 signal in the bilateral cochlea and vestibular structures with no abnormal 7th or 8th cranial nerve enhancement identified. Mastoids are clear. Stylomastoid foramina appear to be normal. No discrete facial soft tissue mass identified. IMPRESSION: 1. Abnormally thickened and heterogeneously enhancing of the Right Meckel's cave and the Right V2 trunk. Although not specific this has a typical appearance of perineural tumor spread, and there is suspected early involvement of the anterior cisternal 5th nerve segment also. But the right infraorbital nerve and right V3 trunk and its branches appear spared. And no facial or bony skull base tumor is identified. 2. Normal left side trigeminal nerve imaging. 3. See also Brain MRI findings today reported separately. Electronically Signed   By: Genevie Ann M.D.   On: 11/25/2020 12:07    Impression:  The patient is eligible for salvage crainal stereotactic radiotherapy  Plan:  Today, I talked to the patient and family about the findings and work-up thus far.  We discussed the natural history of perineural invasion of cutaneous cancer up to skull base and general treatment, highlighting the role of stereotactic radiotherapy in the management.  We discussed the available radiation techniques, and focused on the details of logistics and delivery.  We reviewed the anticipated acute and late sequelae associated with radiation in this setting.  The patient was encouraged to ask questions that I answered to the best of my ability.    The patient would like to proceed with radiation and will be scheduled for CT simulation.  He'll meet with neurosurgery to assist in this case.  I spent 15 minutes on this encounter.  _____________________________________  Sheral Apley. Tammi Klippel,  M.D.     Relevant Reference:  Click Here to Read >>> Link Out to Full Text

## 2020-11-30 NOTE — Addendum Note (Signed)
Encounter addended by: [name], MD on: 11/30/2020 1:00 PM  Actions taken: Clinical Note Signed

## 2020-11-30 NOTE — Progress Notes (Addendum)
  Radiation Oncology         [phone]) [phone] ________________________________  Name: Paul Vasquez MRN: [id number]  Date: 11/30/2020  DOB: 11-08-1945  SIMULATION AND TREATMENT PLANNING NOTE    ICD-10-CM   1. 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 Squamous cell carcinoma of face  C44.320     DIAGNOSIS:  75 y.o.gentleman with trigeminal nerve involvement of cutaneous Squamous Cell Carcinoma with involvement of [place].  NARRATIVE:  The patient was brought to the [facility].  Identity was confirmed.  All relevant records and images related to the planned course of therapy were reviewed.  The patient freely provided informed written consent to proceed with treatment after reviewing the details related to the planned course of therapy. The consent form was witnessed and verified by the simulation staff. Intravenous access was established for contrast administration. Then, the patient was set-up in a stable reproducible supine position for radiation therapy.  A relocatable thermoplastic stereotactic head frame was fabricated for precise immobilization.  CT images were obtained.  Surface markings were placed.  The CT images were loaded into the planning software and fused with the patient's targeting MRI scan.  Then the target and avoidance structures were contoured.  Treatment planning then occurred.  The radiation prescription was entered and confirmed.  I have requested 3D planning  I have requested a DVH of the following structures: Brain stem, brain, left eye, right eye, lenses, optic chiasm, target volumes, uninvolved brain, and normal tissue.    SPECIAL TREATMENT PROCEDURE:  The planned course of therapy using radiation constitutes a special treatment procedure. Special care is required in the management of this patient for the following reasons. This treatment constitutes a Special Treatment Procedure for the following reason: High dose per fraction requiring  special monitoring for increased toxicities of treatment including daily imaging.  The special nature of the planned course of radiotherapy will require increased physician supervision and oversight to ensure patient's safety with optimal treatment outcomes.  PLAN:  The patient will receive 25 Gy in 5 fractions.  ________________________________  Sheral Apley Tammi Klippel, M.D.

## 2020-12-05 ENCOUNTER — Other Ambulatory Visit: Payer: Self-pay | Admitting: Family Medicine

## 2020-12-05 DIAGNOSIS — M810 Age-related osteoporosis without current pathological fracture: Secondary | ICD-10-CM

## 2020-12-06 DIAGNOSIS — Z510 Encounter for antineoplastic radiation therapy: Secondary | ICD-10-CM | POA: Diagnosis not present

## 2020-12-07 ENCOUNTER — Ambulatory Visit: Payer: Medicare PPO | Admitting: Radiation Oncology

## 2020-12-08 ENCOUNTER — Ambulatory Visit
Admission: RE | Admit: 2020-12-08 | Discharge: 2020-12-08 | Disposition: A | Discharge status: Home / Self Care | Payer: Medicare PPO | Source: Ambulatory Visit | Attending: Radiation Oncology | Admitting: Radiation Oncology

## 2020-12-08 ENCOUNTER — Other Ambulatory Visit: Payer: Self-pay

## 2020-12-08 VITALS — BP 132/69 | HR 63 | Temp 96.7°F | Resp 18

## 2020-12-08 DIAGNOSIS — C7250 Malignant neoplasm of unspecified cranial nerve: Secondary | ICD-10-CM

## 2020-12-08 DIAGNOSIS — Z510 Encounter for antineoplastic radiation therapy: Secondary | ICD-10-CM | POA: Diagnosis not present

## 2020-12-08 NOTE — Progress Notes (Signed)
Nurse monitoring following initial SRS treatment complete. Vitals stable. Patient denies pain. Patient denies new neurologic symptoms. Instructed patient to avoid strenuous activity for the next 24 hours. Instructed patient to call [phone] with needs associated to todays treatment. Patient verbalized understanding of all reviewed. Patient confirmed his next appointment. Patient exited clinic alone, ambulatory and in no distress.  BP 132/69   Pulse 63   Temp (!) 96.7 F (35.9 C)   Resp 18   SpO2 99%

## 2020-12-09 NOTE — Progress Notes (Signed)
  Radiation Oncology         (336) [phone] ________________________________  Stereotactic Treatment Procedure Note  Name: Paul Vasquez MRN: [id number]  Date: 12/08/2020  DOB: 1946/01/10  SPECIAL TREATMENT PROCEDURE    ICD-10-CM   1. Cancer of cranial nerves ([place])  C72.50     3D TREATMENT PLANNING AND DOSIMETRY:  The patient's radiation plan was reviewed and approved by neurosurgery and radiation oncology prior to treatment.  It showed 3-dimensional radiation distributions overlaid onto the planning CT/MRI image set.  The [facility] for the target structures as well as the organs at risk were reviewed. The documentation of the 3D plan and dosimetry are filed in the radiation oncology EMR.  NARRATIVE:  Paul Vasquez was brought to the TrueBeam stereotactic radiation treatment machine and placed supine on the CT couch. The head frame was applied, and the patient was set up for stereotactic radiosurgery.  Neurosurgery was present for the set-up and delivery  SIMULATION VERIFICATION:  In the couch zero-angle position, the patient underwent Exactrac imaging using the Brainlab system with orthogonal KV images.  These were carefully aligned and repeated to confirm treatment position for each of the isocenters.  The Exactrac snap film verification was repeated at each couch angle.  PROCEDURE: Paul Vasquez received stereotactic radiosurgery to the following targets: Right 10 mm Meckel's cave trigeminal nerve target was treated using 4 Rapid Arc VMAT Beams to a fractional prescription dose of 5 Gy. To be repeated for a total of 5 fractions to a total dose of 25 Gy.  ExacTrac registration was performed for each couch angle.  The 100% isodose line was prescribed.  6 MV X-rays were delivered in the flattening filter free beam mode.  STEREOTACTIC TREATMENT MANAGEMENT:  Following delivery, the patient was transported to nursing in stable condition and monitored for possible acute effects.   Vital signs were recorded BP 132/69   Pulse 63   Temp (!) 96.7 F (35.9 C)   Resp 18   SpO2 99% . The patient tolerated treatment without significant acute effects, and was discharged to home in stable condition.    PLAN: Complete prescribed treatment and then follow-up in one month.  ________________________________  Sheral Apley. Tammi Klippel, M.D.

## 2020-12-11 ENCOUNTER — Ambulatory Visit
Admission: RE | Admit: 2020-12-11 | Discharge: 2020-12-11 | Disposition: A | Discharge status: Home / Self Care | Payer: Medicare PPO | Source: Ambulatory Visit | Attending: Radiation Oncology | Admitting: Radiation Oncology

## 2020-12-11 VITALS — BP 132/73 | HR 61 | Temp 96.8°F | Resp 18

## 2020-12-11 DIAGNOSIS — C61 Malignant neoplasm of prostate: Secondary | ICD-10-CM | POA: Insufficient documentation

## 2020-12-11 DIAGNOSIS — C44320 Squamous cell carcinoma of skin of unspecified parts of face: Secondary | ICD-10-CM | POA: Diagnosis present

## 2020-12-11 DIAGNOSIS — C7250 Malignant neoplasm of unspecified cranial nerve: Secondary | ICD-10-CM

## 2020-12-11 NOTE — Progress Notes (Signed)
Nurse monitoring following 2 of 5 intended SRS treatments complete. Vitals stable. Patient denies pain. Patient denies new neurologic symptoms. Instructed patient to avoid strenuous activity for the next 24 hours. Instructed patient to call [phone] with needs associated to todays treatment. Patient verbalized understanding of all reviewed. Patient confirmed his next appointment. Patient exited clinic alone, ambulatory and in no distress.  BP 132/73   Pulse 61   Temp (!) 96.8 F (36 C)   Resp 18

## 2020-12-13 ENCOUNTER — Other Ambulatory Visit: Payer: Self-pay

## 2020-12-13 ENCOUNTER — Ambulatory Visit
Admission: RE | Admit: 2020-12-13 | Discharge: 2020-12-13 | Disposition: A | Discharge status: Home / Self Care | Payer: Medicare PPO | Source: Ambulatory Visit | Attending: Radiation Oncology | Admitting: Radiation Oncology

## 2020-12-13 VITALS — BP 134/77 | HR 58 | Temp 97.8°F | Resp 20 | Ht 68.0 in

## 2020-12-13 DIAGNOSIS — C7250 Malignant neoplasm of unspecified cranial nerve: Secondary | ICD-10-CM

## 2020-12-13 NOTE — Addendum Note (Signed)
Encounter addended by: Consuella Lose, MD on: 12/13/2020 10:10 AM  Actions taken: Clinical Note Signed

## 2020-12-13 NOTE — Progress Notes (Signed)
Nurse monitoring complete status post 3 of 5 SRS treatments. Patient without complaints. Patient denies new or worsening neurologic symptoms. Reports mild headache to the top of his head, but states it's not new or concerning. Vitals stable. Instructed patient to avoid strenuous activity for the next 24 hours. Instructed patient to call [phone] with needs related to treatment after hours or over the weekend. Patient verbalized understanding. Ambulated out of clinic unassisted without incident  Vitals:   12/13/20 1525  BP: 134/77  Pulse: (!) 58  Resp: 20  Temp: 97.8 F (36.6 C)  SpO2: 99%

## 2020-12-13 NOTE — Op Note (Signed)
Name: Stone Spirito    MRN: [id number]   Date: 12/11/2020    DOB: 16-May-1946   STEREOTACTIC RADIOSURGERY OPERATIVE NOTE  PRE-OPERATIVE DIAGNOSIS:  Metastatic Squamous cell carcinoma  POST-OPERATIVE DIAGNOSIS:  Same  PROCEDURE:  Stereotactic Radiosurgery  SURGEON:  Consuella Lose, MD  RADIATION ONCOLOGIST: Dr. [name], MD  TECHNIQUE:  The patient underwent a radiation treatment planning session in the radiation oncology simulation suite under the care of the radiation oncology physician and physicist.  I participated closely in the radiation treatment planning afterwards. The patient underwent planning CT which was fused to 3T high resolution MRI with 1 mm axial slices.  These images were fused on the planning system.  We contoured the gross target volumes and subsequently expanded this to yield the Planning Target Volume. I actively participated in the planning process.  I helped to define and review the target contours and also the contours of the optic pathway, eyes, brainstem and selected nearby organs at risk.  All the dose constraints for critical structures were reviewed and compared to AAPM Task Group 101.  The prescription dose conformity was reviewed.  I approved the plan electronically.    Accordingly, [name]  was brought to the TrueBeam stereotactic radiation treatment linac and placed in the custom immobilization mask.  The patient was aligned according to the IR fiducial markers with BrainLab Exactrac, then orthogonal x-rays were used in ExacTrac with the 6DOF robotic table and the shifts were made to align the patient  [name] received stereotactic radiosurgery to a prescription dose of 5Gy (1/5 fractions, total 25Gy) uneventfully.    The detailed description of the procedure is recorded in the radiation oncology procedure note.  I was present for the duration of the procedure.  DISPOSITION:   Following delivery, the patient was  transported to nursing in stable condition and monitored for possible acute effects to be discharged to home in stable condition with follow-up in one month.  Consuella Lose, MD [facility]

## 2020-12-14 ENCOUNTER — Telehealth: Payer: Self-pay | Admitting: Adult Health

## 2020-12-14 NOTE — Telephone Encounter (Signed)
Spoke to pt and he had labs done 12-08-20, see care everywhere labs [facility].

## 2020-12-14 NOTE — Telephone Encounter (Signed)
Pt called, had lipid panel cholesterol done with Dr.  Jacelyn Grip at [facility]. Would like a call from the nurse to discuss sending over the report.

## 2020-12-15 ENCOUNTER — Other Ambulatory Visit: Payer: Self-pay

## 2020-12-15 ENCOUNTER — Ambulatory Visit
Admission: RE | Admit: 2020-12-15 | Discharge: 2020-12-15 | Disposition: A | Discharge status: Home / Self Care | Payer: Medicare PPO | Source: Ambulatory Visit | Attending: Radiation Oncology | Admitting: Radiation Oncology

## 2020-12-15 VITALS — BP 121/68 | HR 66 | Temp 96.9°F | Resp 18

## 2020-12-15 DIAGNOSIS — C7250 Malignant neoplasm of unspecified cranial nerve: Secondary | ICD-10-CM | POA: Diagnosis not present

## 2020-12-15 DIAGNOSIS — C61 Malignant neoplasm of prostate: Secondary | ICD-10-CM

## 2020-12-15 NOTE — Progress Notes (Signed)
Nurse monitoring complete status post 4 of 5 SRS treatments. Patient without complaints. Patient denies new or worsening neurologic symptoms. Reports mild headache to the top of his head, but states it's not new or concerning. Reports treatment area is achy and he is very fatigued but, not concerned about any of these symptoms. Assured patient these are all normal symptoms associated with his current therapy. Vitals stable. Instructed patient to avoid strenuous activity for the next 24 hours. Instructed patient to call [phone] with needs related to treatment after hours or over the weekend. Patient verbalized understanding. Ambulated out of clinic unassisted without incident.  BP 121/68   Pulse 66   Temp (!) 96.9 F (36.1 C)   Resp 18   SpO2 99%

## 2020-12-15 NOTE — Progress Notes (Signed)
  Radiation Oncology         (336) [phone] ________________________________  Stereotactic Treatment Procedure Note  Name: Paul Vasquez MRN: [id number]  Date: 12/13/2020  DOB: 01/03/1946  SPECIAL TREATMENT PROCEDURE    ICD-10-CM   1. Cancer of cranial nerves ([place])  C72.50     3D TREATMENT PLANNING AND DOSIMETRY:  The patient's radiation plan was reviewed and approved by neurosurgery and radiation oncology prior to treatment.  It showed 3-dimensional radiation distributions overlaid onto the planning CT/MRI image set.  [facility] for the target structures as well as the organs at risk were reviewed. The documentation of the 3D plan and dosimetry are filed in the radiation oncology EMR.  NARRATIVE:  Paul Vasquez was brought to the TrueBeam stereotactic radiation treatment machine and placed supine on the CT couch. The head frame was applied, and the patient was set up for stereotactic radiosurgery.  Neurosurgery was present for the set-up and delivery  SIMULATION VERIFICATION:  In the couch zero-angle position, the patient underwent Exactrac imaging using the Brainlab system with orthogonal KV images.  These were carefully aligned and repeated to confirm treatment position for each of the isocenters.  The Exactrac snap film verification was repeated at each couch angle.  PROCEDURE: Paul Vasquez received stereotactic radiosurgery to the following targets: Right 10 mm Meckel's cave trigeminal nerve target was treated using 4 Rapid Arc VMAT Beams to a fractional prescription dose of 5 Gy. To be repeated for a total of 5 fractions to a total dose of 25 Gy.  ExacTrac registration was performed for each couch angle.  The 100% isodose line was prescribed.  6 MV X-rays were delivered in the flattening filter free beam mode.  STEREOTACTIC TREATMENT MANAGEMENT:  Following delivery, the patient was transported to nursing in stable condition and monitored for possible acute effects.   Vital signs were recorded BP 134/77 (BP Location: Left Arm, Patient Position: Sitting, Cuff Size: Normal)   Pulse (!) 58   Temp 97.8 F (36.6 C)   Resp 20   Ht 5\' 8"  (1.727 m)   SpO2 99%   BMI 20.83 kg/m . The patient tolerated treatment without significant acute effects, and was discharged to home in stable condition.    PLAN: Complete prescribed treatment and then follow-up in one month.  -----------------------------------  Eppie Gibson, MD

## 2020-12-18 ENCOUNTER — Ambulatory Visit
Admission: RE | Admit: 2020-12-18 | Discharge: 2020-12-18 | Disposition: A | Discharge status: Home / Self Care | Payer: Medicare PPO | Source: Ambulatory Visit | Attending: Radiation Oncology | Admitting: Radiation Oncology

## 2020-12-18 ENCOUNTER — Other Ambulatory Visit: Payer: Self-pay

## 2020-12-18 ENCOUNTER — Encounter: Payer: Self-pay | Admitting: Urology

## 2020-12-18 ENCOUNTER — Encounter: Payer: Self-pay | Admitting: Radiation Oncology

## 2020-12-18 ENCOUNTER — Other Ambulatory Visit: Payer: Self-pay | Admitting: Radiation Oncology

## 2020-12-18 VITALS — BP 120/71 | HR 66 | Temp 97.6°F | Resp 20

## 2020-12-18 DIAGNOSIS — C7250 Malignant neoplasm of unspecified cranial nerve: Secondary | ICD-10-CM | POA: Diagnosis not present

## 2020-12-18 DIAGNOSIS — C44320 Squamous cell carcinoma of skin of unspecified parts of face: Secondary | ICD-10-CM

## 2020-12-18 NOTE — Progress Notes (Signed)
  Radiation Oncology         [phone]) [phone] ________________________________  Name: Paul Vasquez MRN: [id number]  Date: 12/18/2020  DOB: 10-05-1945  End of Treatment Note  Diagnosis:    75 y.o.gentleman with trigeminal nerve involvement of cutaneous Squamous Cell Carcinoma with involvement of [place].     Indication for treatment:  Local Control, Curative       Radiation treatment dates:   4/29, 5/2, 5/4, 5/6 and 12/18/2020  Site/dose/beams/energy:   [name] received stereotactic radiosurgery to the following target: His Right 10 mm Meckel's cave trigeminal nerve target was treated using 4 Rapid Arc VMAT Beams to a fractional prescription dose of 5 Gy which was repeated for a total of 5 fractions to a total dose of 25 Gy.  ExacTrac registration was performed for each couch angle.  The 100% isodose line was prescribed.  6 MV X-rays were delivered in the flattening filter free beam mode.  Narrative: The patient tolerated radiation treatment relatively well.   He experienced fatigue and continuing dense numbness in the V2 distribution.  Plan: The patient has completed radiation treatment. The patient will return to radiation oncology clinic for routine followup in one month. I advised him to call or return sooner if he has any questions or concerns related to his recovery or treatment.  I offered him a Medrol Dose Pak for the numbness or possible referral to neuro-onc in case of neuropathic pain.  He was not interested in either of those at this time, but, will call if anything worsens.  ________________________________  Sheral Apley Tammi Klippel, M.D.

## 2020-12-19 ENCOUNTER — Ambulatory Visit: Payer: Medicare PPO | Admitting: Radiation Oncology

## 2020-12-25 ENCOUNTER — Telehealth: Payer: Self-pay | Admitting: Radiation Oncology

## 2020-12-25 NOTE — Telephone Encounter (Signed)
Received call from patient requesting return call. Phoned patient back to inquire. Patient reports intermittent jabbing pain at the back of his head on the right side closer to his crown than his neck. Patient explains when he lays down the jabbing pain becomes more persistent. Patient denies trying motrin or tylenol for pain relief. Patient denies any other neurologic symptoms. Patient denies dizziness, nausea, vomiting, diplopia or tinnitus. Patient understands this RN will inform Dr. Tammi Klippel of these findings and phone him back with directions.

## 2020-12-26 ENCOUNTER — Telehealth: Payer: Self-pay | Admitting: Radiation Oncology

## 2020-12-26 NOTE — Telephone Encounter (Signed)
Phoned patient back. No answer. Left voicemail message explaining that he is most likely having neuropathic pain related to srs treatment. Explained that Dr. Tammi Klippel will gladly send in a medrol dose pack should he desire. Instructed patient to phone back with preferred pharmacy if he wishes this script be sent. Awaiting return call.

## 2020-12-26 NOTE — Telephone Encounter (Signed)
Patient phoned again and left voicemail inquiring about Dr. Tammi Klippel response to the new pain he is having.

## 2021-01-03 ENCOUNTER — Telehealth: Payer: Self-pay | Admitting: Radiation Oncology

## 2021-01-03 NOTE — Telephone Encounter (Signed)
Phoned patient to inquire about status. Patient reports alternating ibuprofen and tylenol seems to help the zings of pain he feels mostly when laying down to sleep. Patient reports he isn't requiring as much of either medication and rarely takes it during the day. He reports taking tylenol before bed and advil when he wakes in the middle of the night. Patient questions if "lump of numbness" he feels will be forever. Patient understands this RN will inquire and phone him back. Patient verbalized appreciation for the call.

## 2021-01-05 ENCOUNTER — Other Ambulatory Visit: Payer: Self-pay

## 2021-01-05 ENCOUNTER — Ambulatory Visit
Admission: RE | Admit: 2021-01-05 | Discharge: 2021-01-05 | Disposition: A | Discharge status: Home / Self Care | Payer: Medicare PPO | Source: Ambulatory Visit | Attending: Family Medicine | Admitting: Family Medicine

## 2021-01-05 DIAGNOSIS — M810 Age-related osteoporosis without current pathological fracture: Secondary | ICD-10-CM

## 2021-01-22 ENCOUNTER — Telehealth: Payer: Self-pay | Admitting: Adult Health

## 2021-01-22 NOTE — Telephone Encounter (Signed)
Pt called and LVM stating that he is needing to speak to the provider regarding a "mysterious sensation" in his R forearm. Please advise.

## 2021-01-23 NOTE — Telephone Encounter (Signed)
I called the patient.  He states that after his stroke he had numbing/tingling sensation in his right hand and wrist and that sort of resolved.  He noted middle of last week some sensation tingling/numbness in his right arm forearm area around the muscle area.  His activity has not changed, R hand dominant.   There is no weakness, is transient comes and goes.  States it is hard to explain.  We last saw patient in March (now prn as needed).  I relayed will let provider know when she returns and see advice they have for him.  He will let us know if worsens.

## 2021-01-25 NOTE — Telephone Encounter (Signed)
Pt called, have not heard anything from [place], NP. Would like a call from the nurse.

## 2021-03-12 ENCOUNTER — Encounter (INDEPENDENT_AMBULATORY_CARE_PROVIDER_SITE_OTHER): Payer: Medicare PPO | Admitting: Ophthalmology

## 2021-04-17 ENCOUNTER — Ambulatory Visit (INDEPENDENT_AMBULATORY_CARE_PROVIDER_SITE_OTHER): Payer: Medicare PPO | Admitting: Ophthalmology

## 2021-04-17 ENCOUNTER — Other Ambulatory Visit: Payer: Self-pay

## 2021-04-17 ENCOUNTER — Encounter (INDEPENDENT_AMBULATORY_CARE_PROVIDER_SITE_OTHER): Payer: Self-pay | Admitting: Ophthalmology

## 2021-04-17 ENCOUNTER — Encounter (INDEPENDENT_AMBULATORY_CARE_PROVIDER_SITE_OTHER): Payer: Medicare PPO | Admitting: Ophthalmology

## 2021-04-17 DIAGNOSIS — H40052 Ocular hypertension, left eye: Secondary | ICD-10-CM | POA: Diagnosis not present

## 2021-04-17 DIAGNOSIS — H2512 Age-related nuclear cataract, left eye: Secondary | ICD-10-CM

## 2021-04-17 DIAGNOSIS — H353122 Nonexudative age-related macular degeneration, left eye, intermediate dry stage: Secondary | ICD-10-CM | POA: Diagnosis not present

## 2021-04-17 NOTE — Assessment & Plan Note (Addendum)
The nature of cataract was discussed with the patient as well as the elective nature of surgery. The patient was reassured that surgery at a later date does not put the patient at risk for a worse outcome. It was emphasized that the need for surgery is dictated by the patient's quality of life as influenced by the cataract. Patient was instructed to maintain close follow up with their general eye care doctor. Follow-up as per Dr. Carolynn Sayers  Should patient have cataract surgery intraocular pressure will likely normalize, with significant darkening from nuclear sclerosis of the lens discussed with the patient

## 2021-04-17 NOTE — Progress Notes (Signed)
04/17/2021     CHIEF COMPLAINT Patient presents for No chief complaint on file.     HISTORY OF PRESENT ILLNESS: Paul Ryle. is a 75 y.o. male who presents to the clinic today for:     Referring physician: Clent Jacks, MD [address] [place],  [place] [address]  HISTORICAL INFORMATION:   Selected notes from the MEDICAL RECORD NUMBER    Lab Results  Component Value Date   HGBA1C 6.1 (H) 12/30/2019     CURRENT MEDICATIONS: No current outpatient medications on file. (Ophthalmic Drugs)   No current facility-administered medications for this visit. (Ophthalmic Drugs)   Current Outpatient Medications (Other)  Medication Sig   aspirin EC 81 MG tablet Take 81 mg by mouth daily. Swallow whole.   atorvastatin (LIPITOR) 40 MG tablet Take 1 tablet (40 mg total) by mouth daily.   calcium-vitamin D (OSCAL WITH D) 500-200 MG-UNIT tablet Take 1 tablet by mouth.    escitalopram (LEXAPRO) 10 MG tablet Take 10 mg by mouth daily.   melatonin 1 MG TABS tablet Take 2 mg by mouth at bedtime.   NON FORMULARY Vitamin Code Grow Bone Calcium   NON FORMULARY Nature Sunshine Immune Stimulator daily   NON FORMULARY Nature Sunshine Skeletal strength daily   NON FORMULARY Growth factor daily   NON FORMULARY Wheat grass daily   NON FORMULARY Q-absorb Co-Q10   NON FORMULARY Apex Energetics Adaptocrine   NON FORMULARY Apex Energetics Gabatone   NON FORMULARY Apex Energetics AD-Pro (bitamin A and D)   NON FORMULARY [place] Prolent    Omega-3 Fatty Acids (OMEGA-3 EPA FISH OIL PO) Take by mouth.   Probiotic Product (SOLUBLE FIBER/PROBIOTICS PO) Take by mouth.   vitamin C (ASCORBIC ACID) 500 MG tablet Take 500 mg by mouth 2 (two) times daily.    No current facility-administered medications for this visit. (Other)      REVIEW OF SYSTEMS:    ALLERGIES Allergies  Allergen Reactions   Amoxicillin Other (See Comments)    un   Sulfamethoxazole Other (See Comments)    un    Sulfamethoxazole-Trimethoprim Other (See Comments)    PAST MEDICAL HISTORY Past Medical History:  Diagnosis Date   Macular degeneration 02/2020   start   Melanoma in situ of ear, right ([place])    Prostate cancer ([place])    Skin cancer    Past Surgical History:  Procedure Laterality Date   MOHS SURGERY     PROSTATECTOMY      FAMILY HISTORY Family History  Problem Relation Age of Onset   Multiple myeloma Father     SOCIAL HISTORY Social History   Tobacco Use   Smoking status: Never   Smokeless tobacco: Never  Vaping Use   Vaping Use: Never used  Substance Use Topics   Alcohol use: Not Currently   Drug use: Never         OPHTHALMIC EXAM:  Base Eye Exam     Visual Acuity (ETDRS)       Right Left   Dist cc NLP 20/20 -1    Correction: Glasses         Tonometry (Tonopen, 9:33 AM)       Right Left   Pressure  23         Pupils       React   Right    Left Brisk         Visual Fields  Left Right    Full          Extraocular Movement       Right Left    Full Full         Neuro/Psych     Oriented x3: Yes   Mood/Affect: Normal         Dilation     Left eye: 1.0% Mydriacyl, 2.5% Phenylephrine @ 9:34 AM           Slit Lamp and Fundus Exam     External Exam       Right Left   External Normal Normal         Slit Lamp Exam       Right Left   Lids/Lashes Normal Normal   Conjunctiva/Sclera White and quiet White and quiet   Cornea  Clear   Anterior Chamber Prosthesis  Deep and quiet   Iris  Round and reactive   Lens  3+ Nuclear sclerosis   Anterior Vitreous  Normal         Fundus Exam       Right Left   Posterior Vitreous prosthesis Posterior vitreous detachment   Disc  Normal   C/D Ratio  0.4   Macula  Intermediate age related macular degeneration, no macular thickening, Retinal pigment epithelial mottling, no hemorrhage, no exudates, Hard drusen, Soft drusen   Vessels  Normal   Periphery  Normal,  78d, 25d,, no masses or tumors or nevi            IMAGING AND PROCEDURES  Imaging and Procedures for 04/17/21  OCT, Retina - OU - Both Eyes       Left Eye Quality was good. Scan locations included subfoveal.              ASSESSMENT/PLAN:  Intermediate stage nonexudative age-related macular degeneration of left eye The nature of age--related macular degeneration was discussed with the patient as well as the distinction between dry and wet types. Checking an Amsler Grid daily with advice to return immediately should a distortion develop, was given to the patient. The patient 's smoking status now and in the past was determined and advice based on the AREDS study was provided regarding the consumption of antioxidant supplements. AREDS 2 vitamin formulation was recommended. Consumption of dark leafy vegetables and fresh fruits of various colors was recommended. Treatment modalities for wet macular degeneration particularly the use of intravitreal injections of anti-blood vessel growth factors was discussed with the patient. Avastin, Lucentis, and Eylea are the available options. On occasion, therapy includes the use of photodynamic therapy and thermal laser. Stressed to the patient do not rub eyes.  Patient was advised to check Amsler Grid daily and return immediately if changes are noted. Instructions on using the grid were given to the patient. All patient questions were answered.     No signs of CNVM clinically or on OCT exams  Cataract, nuclear sclerotic, left eye The nature of cataract was discussed with the patient as well as the elective nature of surgery. The patient was reassured that surgery at a later date does not put the patient at risk for a worse outcome. It was emphasized that the need for surgery is dictated by the patient's quality of life as influenced by the cataract. Patient was instructed to maintain close follow up with their general eye care doctor. Follow-up as  per Dr. Carolynn Sayers  Should patient have cataract surgery intraocular pressure will likely normalize, with significant darkening from  nuclear sclerosis of the lens discussed with the patient  Ocular hypertension of left eye Follow-up with Dr. Carolynn Sayers as scheduled     ICD-10-CM   1. Intermediate stage nonexudative age-related macular degeneration of left eye  H35.3122 OCT, Retina - OU - Both Eyes    2. Cataract, nuclear sclerotic, left eye  H25.12     3. Ocular hypertension of left eye  H40.052       1.  OS I explained to the patient progressive nuclear sclerotic cataract and removal would likely be best carried out prior to further hardening of the lens and risk of more complications with routine surgery.  Understands with the symptoms are of progressive NSC with color change and darkening of his vision  2.  OS with no signs of worsening of ARMD, particularly no signs of CNVM monitor here again in 1 year as he has interval examinations with Dr. Carolynn Sayers and [facility] eye care  3.  Ophthalmic Meds Ordered this visit:  No orders of the defined types were placed in this encounter.      Return in about 1 year (around 04/17/2022) for dilate, OS, OCT.  There are no Patient Instructions on file for this visit.   Explained the diagnoses, plan, and follow up with the patient and they expressed understanding.  Patient expressed understanding of the importance of proper follow up care.   Paul Demark Dyami Umbach M.D. Diseases & Surgery of the Retina and Vitreous Retina & Diabetic [place] 04/17/21     Abbreviations: M myopia (nearsighted); A astigmatism; H hyperopia (farsighted); P presbyopia; Mrx spectacle prescription;  CTL contact lenses; OD right eye; OS left eye; OU both eyes  XT exotropia; ET esotropia; PEK punctate epithelial keratitis; PEE punctate epithelial erosions; DES dry eye syndrome; MGD meibomian gland dysfunction; ATs artificial tears; PFAT's preservative free artificial tears; [place] nuclear  sclerotic cataract; PSC posterior subcapsular cataract; ERM epi-retinal membrane; PVD posterior vitreous detachment; RD retinal detachment; DM diabetes mellitus; DR diabetic retinopathy; NPDR non-proliferative diabetic retinopathy; PDR proliferative diabetic retinopathy; CSME clinically significant macular edema; DME diabetic macular edema; dbh dot blot hemorrhages; CWS cotton wool spot; POAG primary open angle glaucoma; C/D cup-to-disc ratio; HVF humphrey visual field; GVF goldmann visual field; OCT optical coherence tomography; IOP intraocular pressure; BRVO Branch retinal vein occlusion; CRVO central retinal vein occlusion; CRAO central retinal artery occlusion; BRAO branch retinal artery occlusion; RT retinal tear; SB scleral buckle; PPV pars plana vitrectomy; VH Vitreous hemorrhage; PRP panretinal laser photocoagulation; IVK intravitreal kenalog; VMT vitreomacular traction; MH Macular hole;  NVD neovascularization of the disc; NVE neovascularization elsewhere; AREDS age related eye disease study; ARMD age related macular degeneration; POAG primary open angle glaucoma; EBMD epithelial/anterior basement membrane dystrophy; ACIOL anterior chamber intraocular lens; IOL intraocular lens; PCIOL posterior chamber intraocular lens; Phaco/IOL phacoemulsification with intraocular lens placement; [place] photorefractive keratectomy; LASIK laser assisted in situ keratomileusis; HTN hypertension; DM diabetes mellitus; COPD chronic obstructive pulmonary disease

## 2021-04-17 NOTE — Assessment & Plan Note (Addendum)
The nature of age--related macular degeneration was discussed with the patient as well as the distinction between dry and wet types. Checking an Amsler Grid daily with advice to return immediately should a distortion develop, was given to the patient. The patient 's smoking status now and in the past was determined and advice based on the AREDS study was provided regarding the consumption of antioxidant supplements. AREDS 2 vitamin formulation was recommended. Consumption of dark leafy vegetables and fresh fruits of various colors was recommended. Treatment modalities for wet macular degeneration particularly the use of intravitreal injections of anti-blood vessel growth factors was discussed with the patient. Avastin, Lucentis, and Eylea are the available options. On occasion, therapy includes the use of photodynamic therapy and thermal laser. Stressed to the patient do not rub eyes.  Patient was advised to check Amsler Grid daily and return immediately if changes are noted. Instructions on using the grid were given to the patient. All patient questions were answered.     No signs of CNVM clinically or on OCT exams

## 2021-04-17 NOTE — Assessment & Plan Note (Signed)
Follow-up with Dr. Carolynn Sayers as scheduled

## 2021-04-30 ENCOUNTER — Other Ambulatory Visit: Payer: Self-pay

## 2021-05-01 ENCOUNTER — Telehealth: Payer: Self-pay

## 2021-05-01 NOTE — Telephone Encounter (Signed)
Notified patient of MRI appointment 9/22 to arrive at 12:30 and follow-up appointment via telephone with PA on 9/27 at 10:30.  Anniyah Mood R.T.(T)

## 2021-05-03 ENCOUNTER — Other Ambulatory Visit: Payer: Self-pay

## 2021-05-03 ENCOUNTER — Ambulatory Visit
Admission: RE | Admit: 2021-05-03 | Discharge: 2021-05-03 | Disposition: A | Discharge status: Home / Self Care | Payer: Medicare PPO | Source: Ambulatory Visit | Attending: Radiation Oncology | Admitting: Radiation Oncology

## 2021-05-03 DIAGNOSIS — G510 Bell's palsy: Secondary | ICD-10-CM

## 2021-05-03 MED ORDER — GADOBENATE DIMEGLUMINE 529 MG/ML IV SOLN
12.0000 mL | Freq: Once | INTRAVENOUS | Status: AC | PRN
  Administered 2021-05-03: 12 mL via INTRAVENOUS

## 2021-05-07 ENCOUNTER — Encounter: Payer: Self-pay | Admitting: Urology

## 2021-05-07 ENCOUNTER — Inpatient Hospital Stay: Payer: Medicare PPO | Attending: Family Medicine

## 2021-05-07 NOTE — Progress Notes (Signed)
Patient reports moderate fatigue, numbness/ tingling of mouth w/ possible mouth ulcer. No other symptoms reported at this time.   Meaningful use complete.   Patient notified of 10:30am-05/08/21 telephone visit and understands.

## 2021-05-08 ENCOUNTER — Telehealth: Payer: Self-pay

## 2021-05-08 ENCOUNTER — Ambulatory Visit
Admission: RE | Admit: 2021-05-08 | Discharge: 2021-05-08 | Disposition: A | Discharge status: Home / Self Care | Payer: Medicare PPO | Source: Ambulatory Visit | Attending: Urology | Admitting: Urology

## 2021-05-08 DIAGNOSIS — C7250 Malignant neoplasm of unspecified cranial nerve: Secondary | ICD-10-CM

## 2021-05-10 NOTE — Telephone Encounter (Signed)
N/A error

## 2021-05-11 ENCOUNTER — Other Ambulatory Visit: Payer: Self-pay | Admitting: Radiation Therapy

## 2021-05-11 DIAGNOSIS — G500 Trigeminal neuralgia: Secondary | ICD-10-CM

## 2021-05-11 NOTE — Progress Notes (Signed)
Radiation Oncology         [phone]) [phone] ________________________________  Name: Milana Obey. MRN: [id number]  Date: 05/08/2021  DOB: November 04, 1945  Post-treatment Follow-Up Note  CC: [name], MD  [name], MD  Diagnosis:   75 y.o. 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   ICD-10-CM   1. Cancer of cranial nerves (HCC)  C72.50       Interval Since Last Radiation:  5 months 4/29, 5/2, 5/4, 5/6 and 12/18/2020//SRS: The right 10 mm Meck[place] trigeminal nerve target was treated to a prescription dose of 5 Gy which was repeated for a total of 5 fractions to a total dose of 25 Gy.  05/18/2019 - 06/28/2019: Right Temple / 60 Gy in 30 fractions  Narrative:  I spoke with the patient to conduct his routine scheduled 3 month follow up visit via telephone to spare the patient unnecessary potential exposure in the healthcare setting during the current COVID-19 pandemic.  The patient was notified in advance and gave permission to proceed with this visit format.  He tolerated radiation treatment relatively well.   He experienced fatigue and continuing dense numbness in the V2 distribution.                              On review of systems, the patient states that he is doing well in general.  He continues with the facial numbness but this has not progressively worsened.  He denies any headaches, nausea, vomiting, dizziness or imbalance.  Overall, he is pleased with his progress to date.  He had a recent follow-up MRI face/trigeminal nerve study on 05/03/2021 which confirms no progressive disease at the right V2 segment of the trigeminal nerve and no evidence of intracranial involvement.  We reviewed these findings today.   ALLERGIES:  is allergic to amoxicillin, sulfamethoxazole, and sulfamethoxazole-trimethoprim.  Meds: Current  Outpatient Medications  Medication Sig Dispense Refill   aspirin EC 81 MG tablet Take 81 mg by mouth daily. Swallow whole.     atorvastatin (LIPITOR) 40 MG tablet Take 1 tablet (40 mg total) by mouth daily. 90 tablet 0   calcium-vitamin D (OSCAL WITH D) 500-200 MG-UNIT tablet Take 1 tablet by mouth.      escitalopram (LEXAPRO) 10 MG tablet Take 10 mg by mouth daily.     melatonin 1 MG TABS tablet Take 2 mg by mouth at bedtime.     NON FORMULARY Vitamin Code Grow Bone Calcium     NON FORMULARY Nature Sunshine Immune Stimulator daily     NON FORMULARY Nature Sunshine Skeletal strength daily     NON FORMULARY Growth factor daily     NON FORMULARY Wheat grass daily     NON FORMULARY Q-absorb Co-Q10     NON FORMULARY Apex Energetics Adaptocrine     NON FORMULARY Apex Energetics Gabatone     NON FORMULARY Apex Energetics AD-Pro (bitamin A and D)     NON FORMULARY [place] Prolent      Omega-3 Fatty Acids (OMEGA-3 EPA FISH OIL PO) Take by mouth.     Probiotic Product (SOLUBLE FIBER/PROBIOTICS PO) Take by mouth.     vitamin C (ASCORBIC ACID) 500 MG tablet Take 500 mg by mouth 2 (two) times daily.      No current facility-administered medications for this  encounter.    Physical Findings: Unable to assess due to telephone follow-up visit format.  Lab Findings: Lab Results  Component Value Date   WBC 4.0 12/31/2019   HGB 13.4 12/31/2019   HCT 40.1 12/31/2019   PLT 210 12/31/2019    Lab Results  Component Value Date   NA 140 12/31/2019   K 3.7 12/31/2019   CO2 24 12/31/2019   GLUCOSE 97 12/31/2019   BUN 20 11/30/2020   CREATININE 0.77 11/30/2020   BILITOT 0.9 12/30/2019   ALKPHOS 56 12/30/2019   AST 14 (L) 12/30/2019   ALT 13 12/30/2019   PROT 5.5 (L) 12/30/2019   ALBUMIN 3.4 (L) 12/30/2019   CALCIUM 8.6 (L) 12/31/2019   ANIONGAP 8 12/31/2019    Radiographic Findings: MR FACE/TRIGEMINAL WO/W CM  Result Date: 05/04/2021 CLINICAL DATA:  History of squamous cell carcinoma of  the right temple. Cranial neuropathy 7. Right-sided facial numbness after completion of radiation. EXAM: MRI FACE TRIGEMINAL WITHOUT AND WITH CONTRAST TECHNIQUE: Multiplanar, multi-echo pulse sequences of the face and surrounding structures, including thin-slice imaging of the trigeminal nerves, were acquired before and after intravenous contrast administration. CONTRAST:  67mL MULTIHANCE GADOBENATE DIMEGLUMINE 529 MG/ML IV SOLN COMPARISON:  11/24/2020 FINDINGS: Limited intracranial/Trigeminal nerves: Unchanged thickening and asymmetrically enhancing V2 division of the right trigeminal nerve at the skull base. No intracranial involvement is seen. The other facial nerve trunks are unremarkable. Symmetric muscles of mastication. No correlate for facial nerve weakness. The facial nerves are symmetrically enhancing normal with appearance of the parotids and stylomastoid fat pads. No evidence of intracranial mass, hemorrhage, hydrocephalus, or collection. Chronic small vessel ischemia in the hemispheric white matter. There is limited sequence and limited coverage of the brain. 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 No new finding Soft tissues: No visible soft tissue mass or inflammation. Osseous: No evidence of bony metastasis. Other: None. IMPRESSION: 1. No progressive disease at the right V2 segment of the trigeminal nerve. No evidence of intracranial involvement today. 2. No finding to correlate with history of facial neuropathy. Electronically Signed   By: Jorje Guild M.D.   On: 05/04/2021 07:02   OCT, Retina - OU - Both Eyes  Result Date: 04/17/2021 Left Eye Quality was good. Scan locations included subfoveal. Central Foveal Thickness: 327. Findings include retinal drusen , no IRF, no SRF. Notes No signs of CNVM OS stable over time    Impression/Plan: This visit was conducted via telephone to spare the  patient unnecessary potential exposure in the healthcare setting during the current COVID-19 pandemic.  75 y.o. gentleman with Stage T2b invasive, cutaneous Squamous Cell Carcinoma of the Right [place] now with Trigeminal Nerve Involvement up to [facility]. He appears to have recovered well from the effects of his recent [facility] and is currently without complaints.  His recent MRI face/trigeminal nerve study confirms no progressive disease at the right V2 segment of the trigeminal nerve and no evidence of intracranial involvement.  We discussed the plan to continue with serial MRI brain/face/trigeminal nerve studies every 3 to 4 months with a telephone follow-up thereafter to review results.  He appears to have a good understanding of our recommendations and is comfortable and in agreement with the plan.  He knows that he is welcome to call anytime in the interim with any questions or concerns related to his prior radiotherapy.  Given current concerns for patient exposure during the COVID-19 pandemic, this encounter was  conducted via telephone. The patient was notified in advance and has given verbal consent for this type of encounter. The time spent during this encounter was 20 minutes. The attendants for this meeting include Freeman Caldron, PA-C and patient, Altair Appenzeller. During the encounter, Chenell Lozon, PA-C was located at [facility] Radiation Oncology Department.  Patient, Olie Dibert was located at home.   [name], MMS, PA-C [place] at [place]: [phone]  Fax: [phone]

## 2021-05-15 DIAGNOSIS — Z1211 Encounter for screening for malignant neoplasm of colon: Secondary | ICD-10-CM | POA: Diagnosis not present

## 2021-05-15 DIAGNOSIS — Z86010 Personal history of colonic polyps: Secondary | ICD-10-CM | POA: Diagnosis not present

## 2021-05-17 DIAGNOSIS — N3946 Mixed incontinence: Secondary | ICD-10-CM | POA: Diagnosis not present

## 2021-05-17 DIAGNOSIS — M818 Other osteoporosis without current pathological fracture: Secondary | ICD-10-CM | POA: Diagnosis not present

## 2021-05-17 DIAGNOSIS — M6281 Muscle weakness (generalized): Secondary | ICD-10-CM | POA: Diagnosis not present

## 2021-06-12 DIAGNOSIS — M818 Other osteoporosis without current pathological fracture: Secondary | ICD-10-CM | POA: Diagnosis not present

## 2021-06-12 DIAGNOSIS — M6281 Muscle weakness (generalized): Secondary | ICD-10-CM | POA: Diagnosis not present

## 2021-06-12 DIAGNOSIS — N3946 Mixed incontinence: Secondary | ICD-10-CM | POA: Diagnosis not present

## 2021-06-20 DIAGNOSIS — C61 Malignant neoplasm of prostate: Secondary | ICD-10-CM | POA: Diagnosis not present

## 2021-07-02 DIAGNOSIS — Z970 Presence of artificial eye: Secondary | ICD-10-CM | POA: Diagnosis not present

## 2021-07-02 DIAGNOSIS — H40052 Ocular hypertension, left eye: Secondary | ICD-10-CM | POA: Diagnosis not present

## 2021-07-02 DIAGNOSIS — H04122 Dry eye syndrome of left lacrimal gland: Secondary | ICD-10-CM | POA: Diagnosis not present

## 2021-07-02 DIAGNOSIS — H353122 Nonexudative age-related macular degeneration, left eye, intermediate dry stage: Secondary | ICD-10-CM | POA: Diagnosis not present

## 2021-07-02 DIAGNOSIS — F4381 Prolonged grief disorder: Secondary | ICD-10-CM | POA: Diagnosis not present

## 2021-07-02 DIAGNOSIS — H2512 Age-related nuclear cataract, left eye: Secondary | ICD-10-CM | POA: Diagnosis not present

## 2021-07-10 ENCOUNTER — Other Ambulatory Visit: Payer: Self-pay | Admitting: Radiation Therapy

## 2021-07-10 DIAGNOSIS — G510 Bell's palsy: Secondary | ICD-10-CM

## 2021-07-10 DIAGNOSIS — F4381 Prolonged grief disorder: Secondary | ICD-10-CM | POA: Diagnosis not present

## 2021-07-11 DIAGNOSIS — Z8673 Personal history of transient ischemic attack (TIA), and cerebral infarction without residual deficits: Secondary | ICD-10-CM | POA: Diagnosis not present

## 2021-07-11 DIAGNOSIS — M818 Other osteoporosis without current pathological fracture: Secondary | ICD-10-CM | POA: Diagnosis not present

## 2021-07-11 DIAGNOSIS — R7303 Prediabetes: Secondary | ICD-10-CM | POA: Diagnosis not present

## 2021-07-12 DIAGNOSIS — M6281 Muscle weakness (generalized): Secondary | ICD-10-CM | POA: Diagnosis not present

## 2021-07-12 DIAGNOSIS — C61 Malignant neoplasm of prostate: Secondary | ICD-10-CM | POA: Diagnosis not present

## 2021-07-12 DIAGNOSIS — N3946 Mixed incontinence: Secondary | ICD-10-CM | POA: Diagnosis not present

## 2021-07-17 DIAGNOSIS — R600 Localized edema: Secondary | ICD-10-CM | POA: Diagnosis not present

## 2021-07-17 DIAGNOSIS — R202 Paresthesia of skin: Secondary | ICD-10-CM | POA: Diagnosis not present

## 2021-07-17 DIAGNOSIS — R7303 Prediabetes: Secondary | ICD-10-CM | POA: Diagnosis not present

## 2021-07-17 DIAGNOSIS — M722 Plantar fascial fibromatosis: Secondary | ICD-10-CM | POA: Diagnosis not present

## 2021-07-17 DIAGNOSIS — M818 Other osteoporosis without current pathological fracture: Secondary | ICD-10-CM | POA: Diagnosis not present

## 2021-07-17 DIAGNOSIS — Z8673 Personal history of transient ischemic attack (TIA), and cerebral infarction without residual deficits: Secondary | ICD-10-CM | POA: Diagnosis not present

## 2021-07-17 DIAGNOSIS — H3530 Unspecified macular degeneration: Secondary | ICD-10-CM | POA: Diagnosis not present

## 2021-07-17 DIAGNOSIS — F321 Major depressive disorder, single episode, moderate: Secondary | ICD-10-CM | POA: Diagnosis not present

## 2021-07-17 DIAGNOSIS — G510 Bell's palsy: Secondary | ICD-10-CM | POA: Diagnosis not present

## 2021-07-18 ENCOUNTER — Ambulatory Visit
Admission: RE | Admit: 2021-07-18 | Discharge: 2021-07-18 | Disposition: A | Discharge status: Home / Self Care | Payer: Medicare PPO | Source: Ambulatory Visit | Attending: Radiation Oncology | Admitting: Radiation Oncology

## 2021-07-18 DIAGNOSIS — J329 Chronic sinusitis, unspecified: Secondary | ICD-10-CM | POA: Diagnosis not present

## 2021-07-18 DIAGNOSIS — Z9889 Other specified postprocedural states: Secondary | ICD-10-CM | POA: Diagnosis not present

## 2021-07-18 DIAGNOSIS — G510 Bell's palsy: Secondary | ICD-10-CM

## 2021-07-18 DIAGNOSIS — R200 Anesthesia of skin: Secondary | ICD-10-CM | POA: Diagnosis not present

## 2021-07-18 DIAGNOSIS — Z8546 Personal history of malignant neoplasm of prostate: Secondary | ICD-10-CM | POA: Diagnosis not present

## 2021-07-18 MED ORDER — GADOBENATE DIMEGLUMINE 529 MG/ML IV SOLN
12.0000 mL | Freq: Once | INTRAVENOUS | Status: AC | PRN
  Administered 2021-07-18: 12 mL via INTRAVENOUS

## 2021-07-19 ENCOUNTER — Telehealth: Payer: Self-pay | Admitting: Radiation Therapy

## 2021-07-19 NOTE — Telephone Encounter (Signed)
Called to share recent MRI results. Paul Vasquez did not answer, so I left a detailed voicemail explaining that the scan looks good; treated disease improved and there is no new disease or areas for concern. The symptoms of facial numbness could just be some side effects from radiation effect. I included my contact information and a request to call back if he has any questions or concerns.   Mont Dutton R.T.(R)(T) Radiation Special Procedures Navigator

## 2021-07-19 NOTE — Addendum Note (Signed)
Addended by: Pincus Large on: 07/19/2021 10:49 AM   Modules accepted: Orders

## 2021-07-23 ENCOUNTER — Inpatient Hospital Stay: Payer: Medicare PPO | Attending: Family Medicine

## 2021-07-24 ENCOUNTER — Telehealth: Payer: Self-pay

## 2021-07-24 ENCOUNTER — Encounter: Payer: Self-pay | Admitting: Urology

## 2021-07-24 DIAGNOSIS — F4381 Prolonged grief disorder: Secondary | ICD-10-CM | POA: Diagnosis not present

## 2021-07-24 NOTE — Progress Notes (Signed)
Patient reports RT sided facial discomfort 2/10 (superior lip and RT eye). No other symptoms reported at this time. Meaningful use complete.  Patient notified of 11:30-12/14/22 TELEPHONE only appointment w/Ashlyn Bruning PA-C and verbalized understanding.  Patient preferred contact # [phone]

## 2021-07-24 NOTE — Telephone Encounter (Signed)
Left message for patient w/ a friendly reminder about his 11:30am-07/25/21 TELEPHONE only appointment, w/ Ashlyn Bruning PA-C. I left my extension [phone] and asked that patient return my call prior to appointment time /date so that I may complete the "nursing portion" of the appointment.

## 2021-07-25 ENCOUNTER — Ambulatory Visit
Admission: RE | Admit: 2021-07-25 | Discharge: 2021-07-25 | Disposition: A | Discharge status: Home / Self Care | Payer: Medicare PPO | Source: Ambulatory Visit | Attending: Urology | Admitting: Urology

## 2021-07-25 DIAGNOSIS — C7250 Malignant neoplasm of unspecified cranial nerve: Secondary | ICD-10-CM | POA: Insufficient documentation

## 2021-07-25 DIAGNOSIS — Z08 Encounter for follow-up examination after completed treatment for malignant neoplasm: Secondary | ICD-10-CM | POA: Diagnosis not present

## 2021-07-25 DIAGNOSIS — C44320 Squamous cell carcinoma of skin of unspecified parts of face: Secondary | ICD-10-CM | POA: Insufficient documentation

## 2021-07-25 DIAGNOSIS — G500 Trigeminal neuralgia: Secondary | ICD-10-CM | POA: Diagnosis not present

## 2021-07-25 NOTE — Progress Notes (Signed)
Radiation Oncology         [id number]) [phone] ________________________________  Name: Paul Vasquez. MRN: [id number]  Date: 07/25/2021  DOB: Feb 13, 1946  Post-treatment Follow-Up Note  CC: [name], MD  [name], MD  Diagnosis:   75 y.o. 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    ICD-10-CM   1. Squamous cell carcinoma of face  C44.320     2. 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  05/18/2019 - 06/28/2019: Right Temple / 60 Gy in 30 fractions  Narrative:  I spoke with the patient to conduct his routine scheduled 3 month follow up visit via telephone to spare the patient unnecessary potential exposure in the healthcare setting during the current COVID-19 pandemic.  The patient was notified in advance and gave permission to proceed with this visit format.  He tolerated radiation treatment relatively well but continues with dense numbness in the V2 distribution. On 07/09/21, he called with concerns of an increase in pressure and numbness felt in his face and towards his lip so we moved his previously scheduled follow up scan up sooner, in light of his prior disease recurrence.                              On review of systems, the patient states that he is doing well in general.  He continues with the facial numbness but this has not progressively worsened.  He reports that some days are better than others and he intermittently has more pronounced numbness on the right side of his face and lip.  He denies any headaches, nausea, vomiting, dizziness or imbalance.  Overall, he is pleased  with his progress to date.  He had a recent follow-up MRI face/trigeminal nerve study on 07/18/2021 which confirms no progressive disease at the right V2 segment of the trigeminal nerve and no evidence of intracranial involvement.  We reviewed these findings today.   ALLERGIES:  is allergic to amoxicillin, sulfamethoxazole, and sulfamethoxazole-trimethoprim.  Meds: Current Outpatient Medications  Medication Sig Dispense Refill   aspirin EC 81 MG tablet Take 81 mg by mouth daily. Swallow whole.     atorvastatin (LIPITOR) 40 MG tablet Take 1 tablet (40 mg total) by mouth daily. 90 tablet 0   calcium-vitamin D (OSCAL WITH D) 500-200 MG-UNIT tablet Take 1 tablet by mouth.      escitalopram (LEXAPRO) 10 MG tablet Take 10 mg by mouth daily.     melatonin 1 MG TABS tablet Take 2 mg by mouth at bedtime.     NON FORMULARY Vitamin Code Grow Bone Calcium     NON FORMULARY Nature Sunshine Immune Stimulator daily     NON FORMULARY Nature Sunshine Skeletal strength daily     NON FORMULARY Growth factor daily     NON FORMULARY Wheat grass daily     NON FORMULARY Q-absorb Co-Q10     NON FORMULARY Apex Energetics Adaptocrine     NON FORMULARY Apex Energetics Gabatone     NON FORMULARY  Apex Energetics AD-Pro (bitamin A and D)     NON FORMULARY [place] Prolent      Omega-3 Fatty Acids (OMEGA-3 EPA FISH OIL PO) Take by mouth.     Probiotic Product (SOLUBLE FIBER/PROBIOTICS PO) Take by mouth.     vitamin C (ASCORBIC ACID) 500 MG tablet Take 500 mg by mouth 2 (two) times daily.      No current facility-administered medications for this encounter.    Physical Findings: Unable to assess due to telephone follow-up visit format.  Lab Findings: Lab Results  Component Value Date   WBC 4.0 12/31/2019   HGB 13.4 12/31/2019   HCT 40.1 12/31/2019   PLT 210 12/31/2019    Lab Results  Component Value Date   NA 140 12/31/2019   K 3.7 12/31/2019   CO2 24 12/31/2019   GLUCOSE 97 12/31/2019   BUN 20  11/30/2020   CREATININE 0.77 11/30/2020   BILITOT 0.9 12/30/2019   ALKPHOS 56 12/30/2019   AST 14 (L) 12/30/2019   ALT 13 12/30/2019   PROT 5.5 (L) 12/30/2019   ALBUMIN 3.4 (L) 12/30/2019   CALCIUM 8.6 (L) 12/31/2019   ANIONGAP 8 12/31/2019    Radiographic Findings: MR FACE/TRIGEMINAL WO/W CM  Result Date: 07/19/2021 CLINICAL DATA:  75 year old male with history of right temple cutaneous squamous cell carcinoma, developed right facial numbness after completion of radiation. History of prostate cancer. EXAM: MRI FACE TRIGEMINAL WITHOUT AND WITH CONTRAST TECHNIQUE: Multiplanar, multi-echo pulse sequences of the face and surrounding structures, including thin-slice imaging of the trigeminal nerves, were acquired before and after intravenous contrast administration. CONTRAST:  86mL MULTIHANCE GADOBENATE DIMEGLUMINE 529 MG/ML IV SOLN COMPARISON:  Face and brain MRI 11/24/2020. More recent face MRI 05/03/2021. restaging. FINDINGS: Limited intracranial/Trigeminal nerves: Brainstem signal appears stable since April. Patchy T2 hyperintensity in the pons. Slightly decreased size of the cisternal right 5th nerve, although stable since April. No definite cisternal or leptomeningeal enhancement identified today. Right Meckel's cave enhancement has regressed since April, and not definitely progressed since September (series 11, image 34). Otherwise the right cavernous sinus appears normal. Subtle asymmetric T2 hyperintensity of the right V2 trunk. Right V2 enhancement appears mildly regressed, less conspicuous since April (series 12, image 4). The right infraorbital nerve remains nonenlarged. Right V3 trunk, inferior alveolar nerve remains symmetric and within normal limits. Chronic postoperative changes to the right globe appear stable with no intraorbital mass identified. Left side trigeminal nerve imaging remains within normal limits. Vascular: Major intracranial vascular flow voids are stable. Left cavernous  sinus appears stable and within normal limits. Sinuses/Orbits: Stable postoperative changes and mild residual paranasal sinus inflammation. Soft tissues: Other visible deep soft tissue spaces of the face and neck appear negative. Osseous: No marrow edema or evidence of acute osseous abnormality. Visualized bone marrow signal is within normal limits. Other: Stable visible whole brain since April. Stable visible internal auditory structures. IMPRESSION: 1. Essentially stable. No progression - and perhaps mildly regressed - abnormal signal and enhancement associated with the Right Trigeminal V2 segment, Meckel's cave. 2. No new metastatic disease or abnormality identified. Electronically Signed   By: Genevie Ann M.D.   On: 07/19/2021 09:53     Impression/Plan: This visit was conducted via telephone to spare the patient unnecessary potential exposure in the healthcare setting during the current COVID-19 pandemic.  75 y.o. gentleman with Stage T2b invasive, cutaneous Squamous Cell Carcinoma of the Right Georgie Chard now with Trigeminal Nerve Involvement up to [facility]. He has recovered well  from the effects of his recent [facility] and remains without new complaints.  His recent MRI face/trigeminal nerve study confirms no progressive disease at the right V2 segment of the trigeminal nerve and no evidence of intracranial involvement.  We discussed the plan to continue with serial MRI brain/face/trigeminal nerve studies every 3 to 4 months with a telephone follow-up thereafter to review results.  If his upcoming scans remain stable without evidence of disease recurrence, we will then move to 6 month surveillance scans. He appears to have a good understanding of our recommendations and is comfortable and in agreement with the plan.  He knows that he is welcome to call anytime in the interim with any questions or concerns related to his prior radiotherapy.  Given current concerns for patient exposure during the COVID-19  pandemic, this encounter was conducted via telephone. The patient was notified in advance and has given verbal consent for this type of encounter. The time spent during this encounter was 20 minutes, including chart review, reviewing radiological studies, telephone conversation with the patient, entering orders and completing documentation. The attendants for this meeting include Paul Caldron, PA-C and patient, Paul Vasquez. During the encounter, Paul Swigert, PA-C was located at [facility] Radiation Oncology Department.  Patient, Paul Vasquez was located at home.   [name], MMS, PA-C [facility]: [phone]   Fax: [phone]

## 2021-08-07 DIAGNOSIS — L570 Actinic keratosis: Secondary | ICD-10-CM | POA: Diagnosis not present

## 2021-08-07 DIAGNOSIS — D485 Neoplasm of uncertain behavior of skin: Secondary | ICD-10-CM | POA: Diagnosis not present

## 2021-08-07 DIAGNOSIS — R239 Unspecified skin changes: Secondary | ICD-10-CM | POA: Diagnosis not present

## 2021-08-07 DIAGNOSIS — Z85820 Personal history of malignant melanoma of skin: Secondary | ICD-10-CM | POA: Diagnosis not present

## 2021-08-07 DIAGNOSIS — Z85828 Personal history of other malignant neoplasm of skin: Secondary | ICD-10-CM | POA: Diagnosis not present

## 2021-08-07 DIAGNOSIS — L821 Other seborrheic keratosis: Secondary | ICD-10-CM | POA: Diagnosis not present

## 2021-08-07 DIAGNOSIS — D225 Melanocytic nevi of trunk: Secondary | ICD-10-CM | POA: Diagnosis not present

## 2021-08-08 DIAGNOSIS — F4381 Prolonged grief disorder: Secondary | ICD-10-CM | POA: Diagnosis not present

## 2021-08-22 ENCOUNTER — Ambulatory Visit: Payer: Self-pay | Admitting: Urology

## 2021-08-22 DIAGNOSIS — F4381 Prolonged grief disorder: Secondary | ICD-10-CM | POA: Diagnosis not present

## 2021-08-31 ENCOUNTER — Other Ambulatory Visit: Payer: Self-pay

## 2021-09-03 DIAGNOSIS — Z85820 Personal history of malignant melanoma of skin: Secondary | ICD-10-CM | POA: Diagnosis not present

## 2021-09-03 DIAGNOSIS — Z85828 Personal history of other malignant neoplasm of skin: Secondary | ICD-10-CM | POA: Diagnosis not present

## 2021-09-03 DIAGNOSIS — D485 Neoplasm of uncertain behavior of skin: Secondary | ICD-10-CM | POA: Diagnosis not present

## 2021-09-04 DIAGNOSIS — N3946 Mixed incontinence: Secondary | ICD-10-CM | POA: Diagnosis not present

## 2021-09-04 DIAGNOSIS — M818 Other osteoporosis without current pathological fracture: Secondary | ICD-10-CM | POA: Diagnosis not present

## 2021-09-04 DIAGNOSIS — M6281 Muscle weakness (generalized): Secondary | ICD-10-CM | POA: Diagnosis not present

## 2021-09-05 DIAGNOSIS — F4381 Prolonged grief disorder: Secondary | ICD-10-CM | POA: Diagnosis not present

## 2021-09-06 DIAGNOSIS — M6740 Ganglion, unspecified site: Secondary | ICD-10-CM | POA: Diagnosis not present

## 2021-09-06 DIAGNOSIS — M7989 Other specified soft tissue disorders: Secondary | ICD-10-CM | POA: Diagnosis not present

## 2021-09-12 DIAGNOSIS — F4381 Prolonged grief disorder: Secondary | ICD-10-CM | POA: Diagnosis not present

## 2021-09-19 DIAGNOSIS — F4381 Prolonged grief disorder: Secondary | ICD-10-CM | POA: Diagnosis not present

## 2021-09-20 ENCOUNTER — Other Ambulatory Visit: Payer: Self-pay | Admitting: Radiation Therapy

## 2021-09-20 DIAGNOSIS — C7931 Secondary malignant neoplasm of brain: Secondary | ICD-10-CM

## 2021-09-21 DIAGNOSIS — N393 Stress incontinence (female) (male): Secondary | ICD-10-CM | POA: Diagnosis not present

## 2021-09-21 DIAGNOSIS — M6281 Muscle weakness (generalized): Secondary | ICD-10-CM | POA: Diagnosis not present

## 2021-09-21 DIAGNOSIS — M62838 Other muscle spasm: Secondary | ICD-10-CM | POA: Diagnosis not present

## 2021-10-02 DIAGNOSIS — M25552 Pain in left hip: Secondary | ICD-10-CM | POA: Diagnosis not present

## 2021-10-02 DIAGNOSIS — M67442 Ganglion, left hand: Secondary | ICD-10-CM | POA: Diagnosis not present

## 2021-10-03 DIAGNOSIS — F4381 Prolonged grief disorder: Secondary | ICD-10-CM | POA: Diagnosis not present

## 2021-10-05 DIAGNOSIS — M62838 Other muscle spasm: Secondary | ICD-10-CM | POA: Diagnosis not present

## 2021-10-05 DIAGNOSIS — M6281 Muscle weakness (generalized): Secondary | ICD-10-CM | POA: Diagnosis not present

## 2021-10-05 DIAGNOSIS — N393 Stress incontinence (female) (male): Secondary | ICD-10-CM | POA: Diagnosis not present

## 2021-10-12 DIAGNOSIS — C61 Malignant neoplasm of prostate: Secondary | ICD-10-CM | POA: Diagnosis not present

## 2021-10-19 ENCOUNTER — Other Ambulatory Visit: Payer: Self-pay

## 2021-10-19 ENCOUNTER — Ambulatory Visit
Admission: RE | Admit: 2021-10-19 | Discharge: 2021-10-19 | Disposition: A | Discharge status: Home / Self Care | Payer: Medicare PPO | Source: Ambulatory Visit | Attending: Radiation Oncology | Admitting: Radiation Oncology

## 2021-10-19 DIAGNOSIS — I639 Cerebral infarction, unspecified: Secondary | ICD-10-CM | POA: Diagnosis not present

## 2021-10-19 DIAGNOSIS — C61 Malignant neoplasm of prostate: Secondary | ICD-10-CM | POA: Diagnosis not present

## 2021-10-19 DIAGNOSIS — R200 Anesthesia of skin: Secondary | ICD-10-CM | POA: Diagnosis not present

## 2021-10-19 DIAGNOSIS — N3946 Mixed incontinence: Secondary | ICD-10-CM | POA: Diagnosis not present

## 2021-10-19 DIAGNOSIS — C44329 Squamous cell carcinoma of skin of other parts of face: Secondary | ICD-10-CM | POA: Diagnosis not present

## 2021-10-19 DIAGNOSIS — J341 Cyst and mucocele of nose and nasal sinus: Secondary | ICD-10-CM | POA: Diagnosis not present

## 2021-10-19 DIAGNOSIS — I6782 Cerebral ischemia: Secondary | ICD-10-CM | POA: Diagnosis not present

## 2021-10-19 DIAGNOSIS — G9389 Other specified disorders of brain: Secondary | ICD-10-CM | POA: Diagnosis not present

## 2021-10-19 DIAGNOSIS — C7931 Secondary malignant neoplasm of brain: Secondary | ICD-10-CM

## 2021-10-19 IMAGING — MR MR HEAD WO/W CM
16 of 17 series · 43 of 48 positions shown · IV contrast (12 ML MULTIHANCE)
Comparison: [DATE]

CLINICAL DATA: History of right temple skin cancer with right
facial numbness after radiotherapy.

EXAM:
MRI HEAD WITHOUT AND WITH CONTRAST
TECHNIQUE: Multiplanar, multiecho pulse sequences of the brain and surrounding
structures were obtained without and with intravenous contrast.
CONTRAST:  12mL MULTIHANCE GADOBENATE DIMEGLUMINE 529 MG/ML IV SOLN

[Series 3: FLAIR · sagittal · 3.0mm · 0.75mm/px · 1 of 39 slices shown (1 of 2)]
[im 1/39]
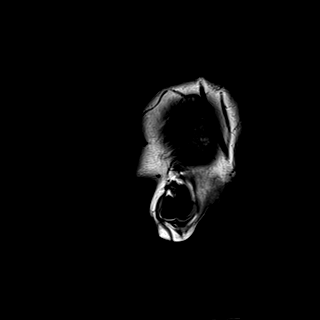

[Series 5: DWI · axial · 3.0mm · 1.50mm/px · z∈[-37,+124]mm · 4 of 87 slices shown (1 of 2)]
[im 1/87]
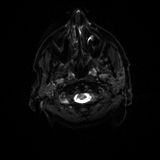
[im 29/87]
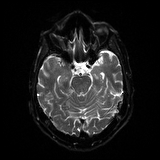
[im 58/87]
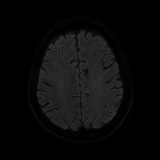
[im 87/87]
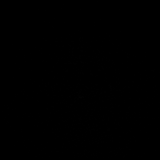

[Series 6: DWI · axial · 3.0mm · 1.50mm/px · z∈[-37,+120]mm · 2 of 43 slices shown (2 of 2)]
[im 1/43]
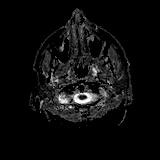
[im 43/43]
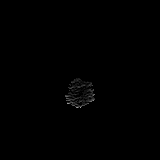

[Series 7: swi_images · axial · 1.5mm · 0.90mm/px · z∈[-33,+116]mm · 5 of 104 slices shown]
[im 1/104]
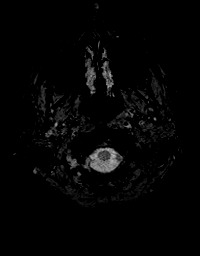
[im 26/104]
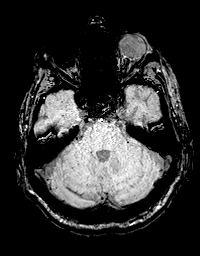
[im 52/104]
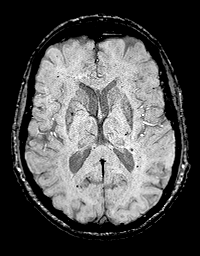
[im 78/104]
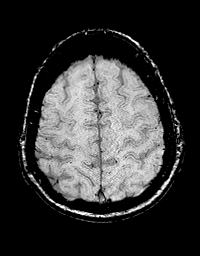
[im 104/104]
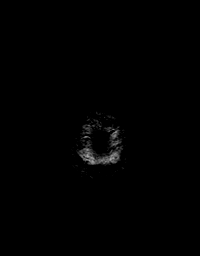

[Series 9: T2 · axial · 5.0mm · 0.57mm/px · 1 of 30 slices shown (1 of 3)]
[im 1/30]
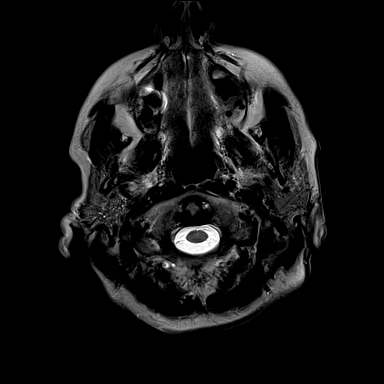

[Series 10: T2 · coronal · 3.0mm · 0.56mm/px · 2 of 33 slices shown (2 of 3)]
[im 1/33]
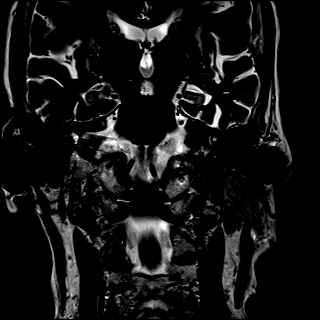
[im 33/33]
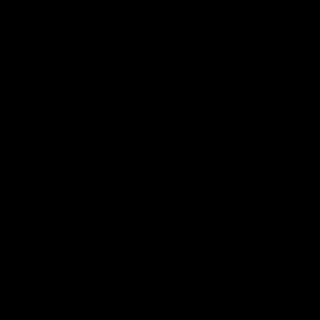

[Series 11: STIR · axial · 3.0mm · 0.50mm/px · z∈[-140,-12]mm · 2 of 33 slices shown]
[im 1/33]
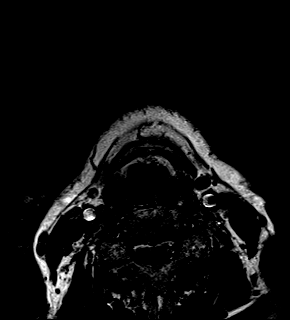
[im 33/33]
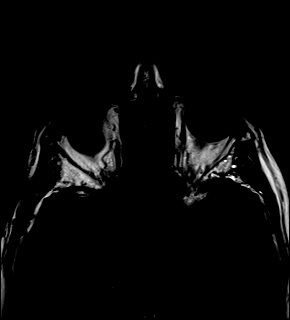

[Series 13: T1 · axial · 3.0mm · 0.31mm/px · z∈[-138,-25]mm · 2 of 35 slices shown (1 of 2)]
[im 1/35]
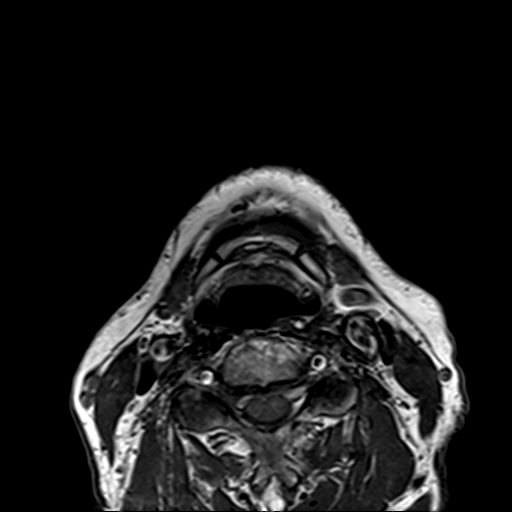
[im 35/35]
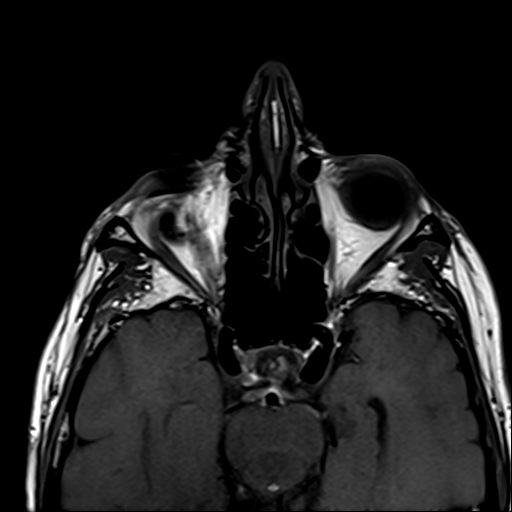

[Series 14: T1 · coronal · 3.0mm · 0.35mm/px · 2 of 38 slices shown (2 of 2)]
[im 1/38]
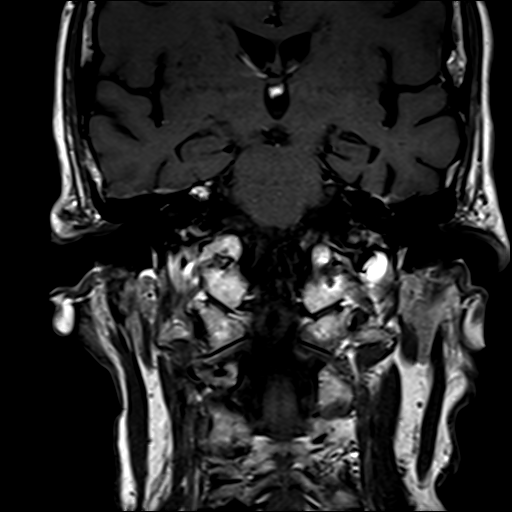
[im 38/38]
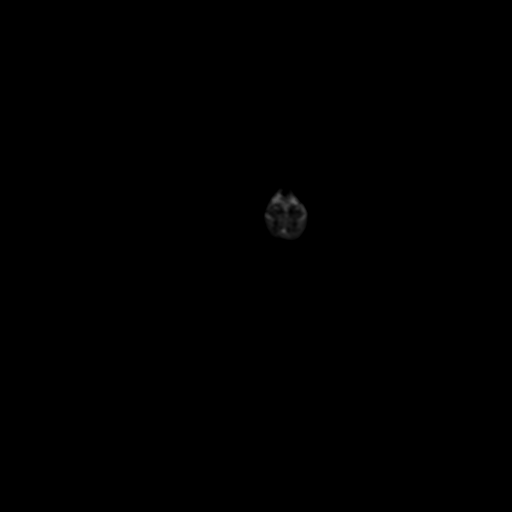

[Series 15: FLAIR · axial · 3.0mm · 0.86mm/px · z∈[-91,+85]mm · 3 of 60 slices shown (2 of 2)]
[im 1/60]
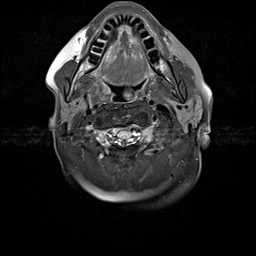
[im 30/60]
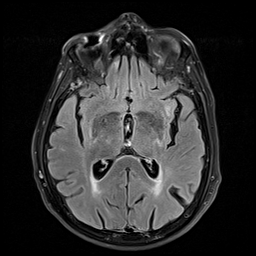
[im 60/60]
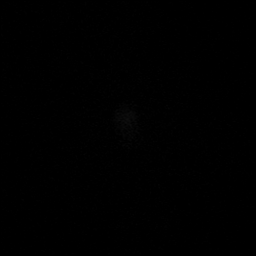

[Series 16: T2 · axial · non-contrast · 1.0mm · 0.86mm/px · z∈[-74,+82]mm · 8 of 160 slices shown (3 of 3)]
[im 1/160]
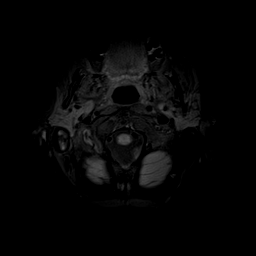
[im 23/160]
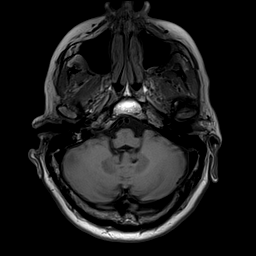
[im 46/160]
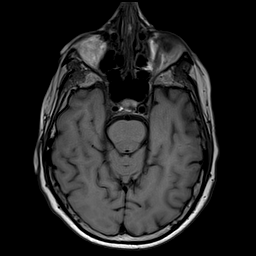
[im 69/160]
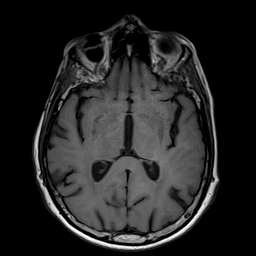
[im 91/160]
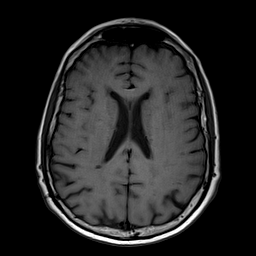
[im 114/160]
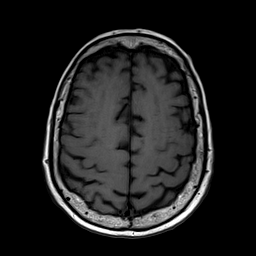
[im 137/160]
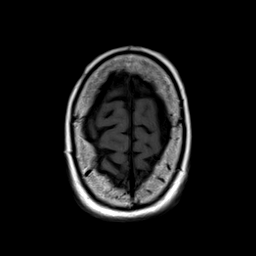
[im 160/160]
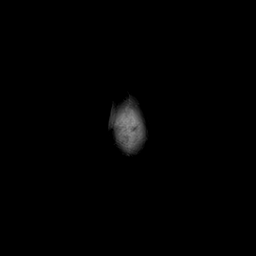

[Series 17: T2 post-contrast · coronal · 3.0mm · 0.57mm/px · 2 of 50 slices shown]
[im 1/50]
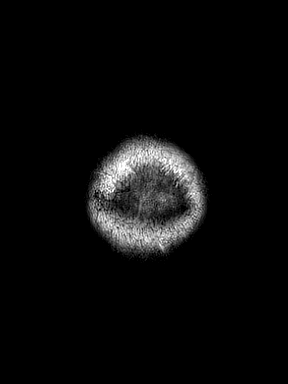
[im 50/50]
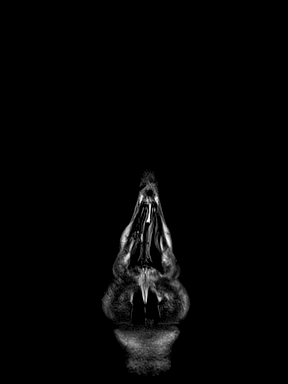

[Series 20: T1 post-contrast · coronal · 3.0mm · 0.57mm/px · 3 of 51 slices shown (1 of 2)]
[im 1/51]
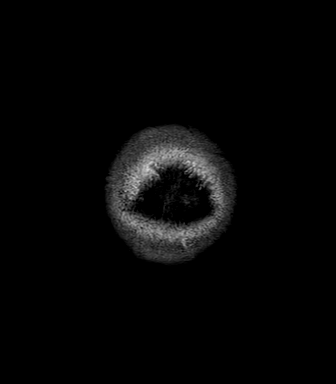
[im 26/51]
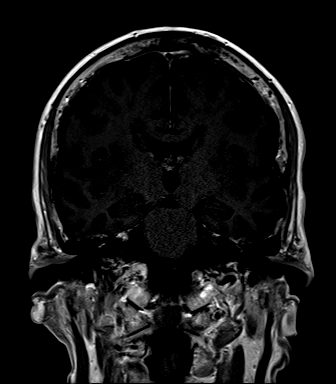
[im 51/51]
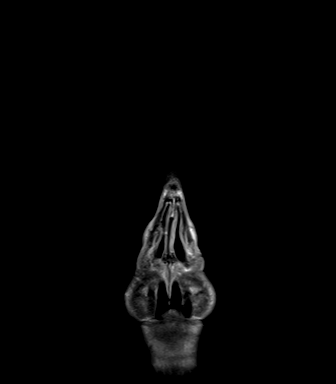

[Series 21: FLAIR post-contrast · sagittal · 3.0mm · 0.75mm/px · 2 of 39 slices shown]
[im 1/39]
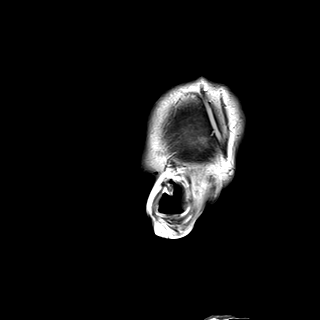
[im 39/39]
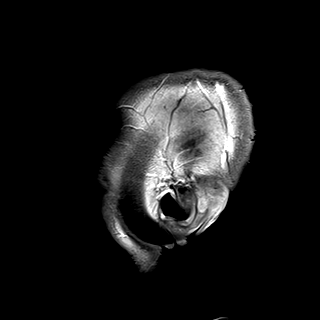

[Series 22: T1 post-contrast · coronal · 3.0mm · 0.35mm/px · 2 of 38 slices shown (2 of 2)]
[im 1/38]
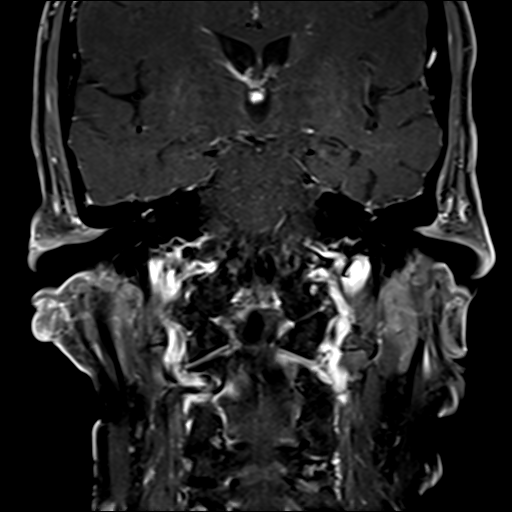
[im 38/38]
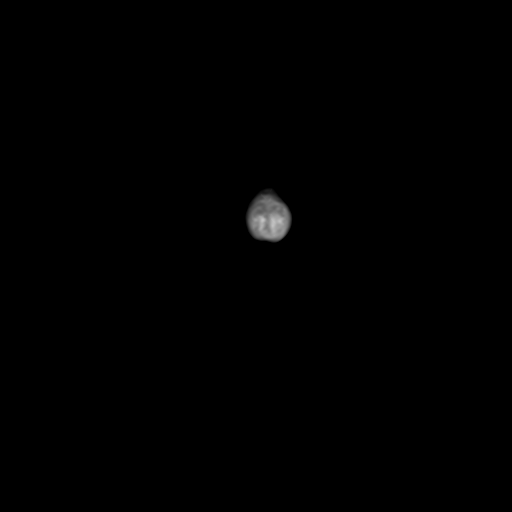

[Series 23: T1 fat-sat post-contrast · axial · 3.0mm · 0.31mm/px · z∈[-135,-23]mm · 2 of 35 slices shown]
[im 1/35]
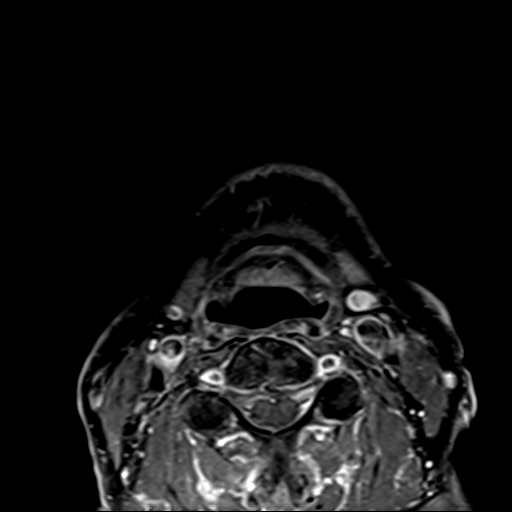
[im 35/35]
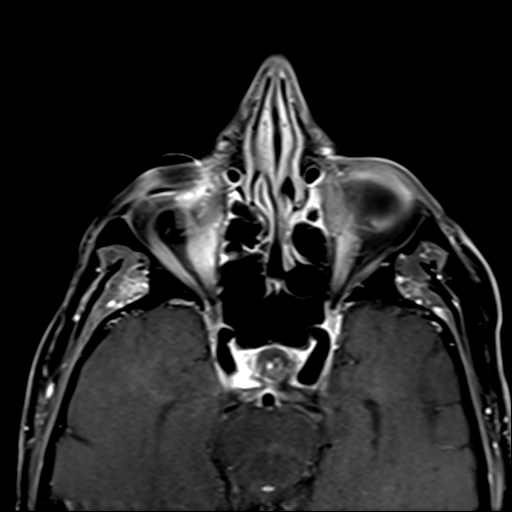

[43 of 48 positions shown; findings below may reference images not displayed]

FINDINGS: Brain: Subcentimeter focus of restricted diffusion in the right
cerebellum, not associated with enhancement. There is a background
of extensive chronic small vessel ischemic gliosis in the
hemispheric white matter and to a lesser extent in the pons.
Redemonstrated innumerable remote micro hemorrhages mainly scattered
along the lobar brain. Small area of more intense hemosiderin
deposition in the parasagittal left frontal cortex. No hydrocephalus
or collection. No abnormal intracranial enhancement.

Vascular: Normal flow voids and vascular enhancements

Skull and upper cervical spine: Normal marrow signal

Sinuses/Orbits: See dedicated face MRI.

These results will be called to the ordering clinician or
representative by the Radiologist Assistant, and communication
documented in the PACS or [REDACTED].
IMPRESSION: 1. Incidental small acute infarct in the right cerebellum.
2. Background of extensive chronic small vessel ischemia.
Innumerable remote lobar micro hemorrhages suggesting amyloid
angiopathy.
3. Face MRI described separately.

## 2021-10-19 IMAGING — MR MR FACE/TRIGEMINAL WO/W CM
18 of 19 series · 44 of 48 positions shown · IV contrast (multihance)
Comparison: [DATE]

CLINICAL DATA: Right temple cutaneous squamous cell carcinoma with
right facial numbness after radiotherapy

EXAM:
MRI FACE TRIGEMINAL WITHOUT AND WITH CONTRAST
TECHNIQUE: Multiplanar, multi-echo pulse sequences of the face and surrounding
structures, including thin-slice imaging of the trigeminal nerves,
were acquired before and after intravenous contrast administration.
CONTRAST:  12mL MULTIHANCE GADOBENATE DIMEGLUMINE 529 MG/ML IV SOLN

[Series 2: FLAIR · sagittal · 3.0mm · 0.75mm/px · 2 of 39 slices shown (1 of 2)]
[im 1/39]
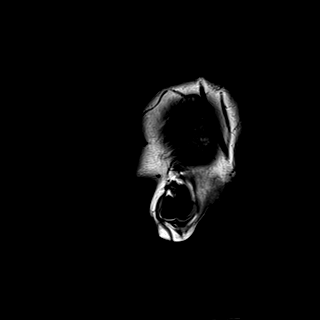
[im 39/39]
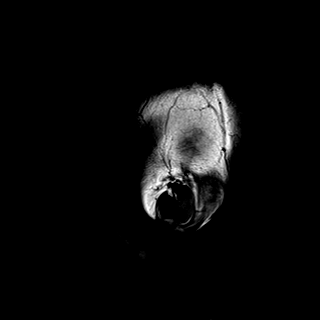

[Series 4: DWI · axial · 3.0mm · 1.50mm/px · z∈[-37,+124]mm · 4 of 87 slices shown (1 of 2)]
[im 1/87]
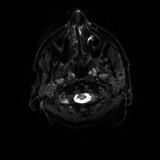
[im 29/87]
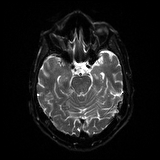
[im 58/87]
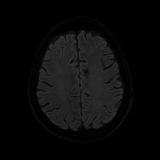
[im 87/87]
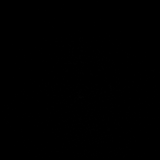

[Series 5: DWI · axial · 3.0mm · 1.50mm/px · z∈[-37,+120]mm · 2 of 43 slices shown (2 of 2)]
[im 1/43]
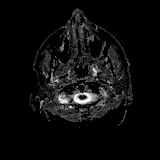
[im 43/43]
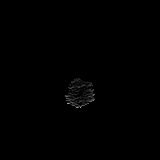

[Series 6: swi_images · axial · 1.5mm · 0.90mm/px · z∈[-33,+116]mm · 4 of 104 slices shown]
[im 1/104]
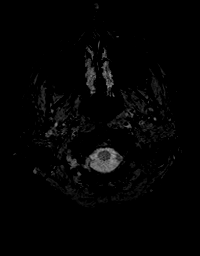
[im 35/104]
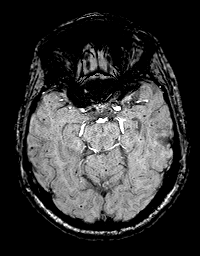
[im 69/104]
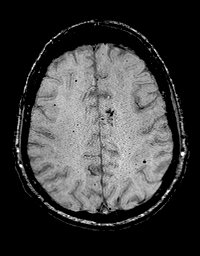
[im 104/104]
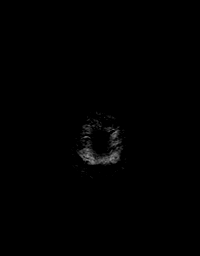

[Series 8: T2 · axial · 5.0mm · 0.57mm/px · 1 of 30 slices shown (1 of 3)]
[im 1/30]
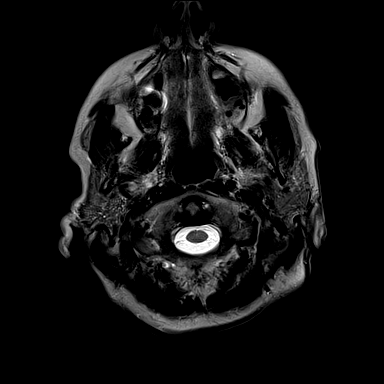

[Series 9: T2 · coronal · 3.0mm · 0.56mm/px · 1 of 33 slices shown (2 of 3)]
[im 1/33]
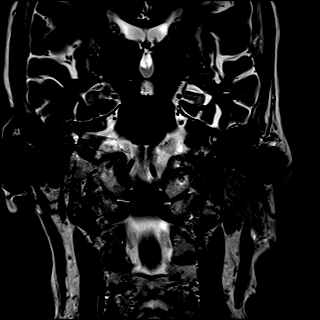

[Series 10: STIR · axial · 3.0mm · 0.50mm/px · 1 of 33 slices shown]
[im 1/33]
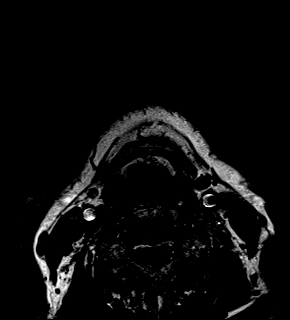

[Series 12: T1 · axial · 3.0mm · 0.31mm/px · 1 of 35 slices shown (1 of 2)]
[im 1/35]
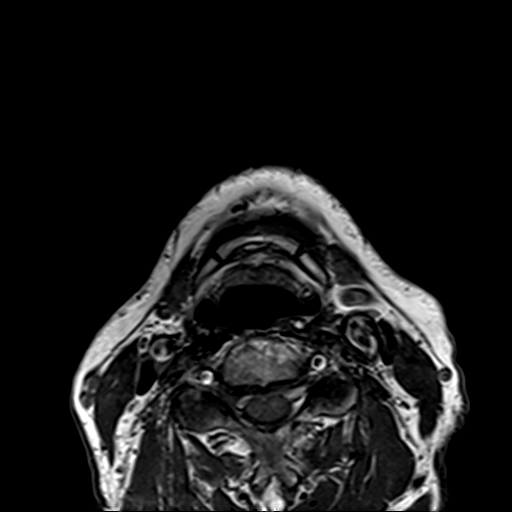

[Series 13: T1 · coronal · 3.0mm · 0.35mm/px · 1 of 38 slices shown (2 of 2)]
[im 1/38]
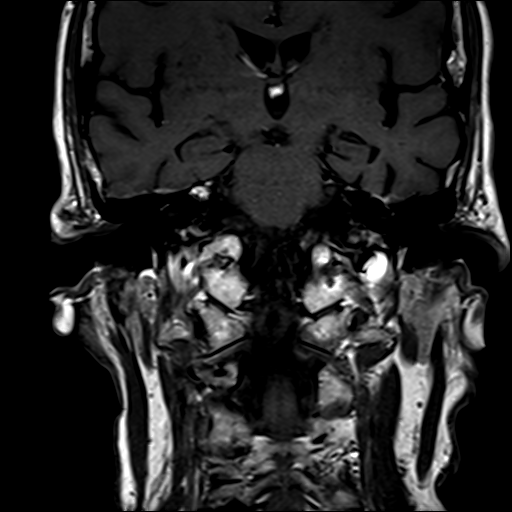

[Series 14: FLAIR · axial · 3.0mm · 0.86mm/px · z∈[-91,+85]mm · 2 of 60 slices shown (2 of 2)]
[im 1/60]
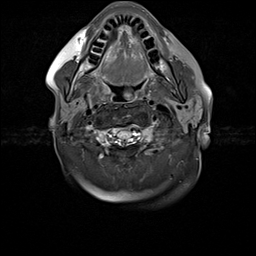
[im 60/60]
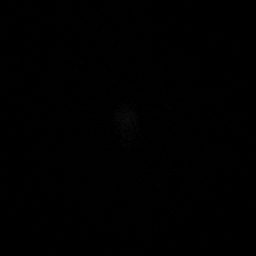

[Series 15: T2 · axial · non-contrast · 1.0mm · 0.86mm/px · z∈[-74,+82]mm · 6 of 160 slices shown (3 of 3)]
[im 1/160]
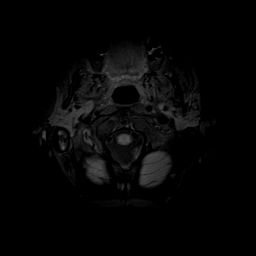
[im 32/160]
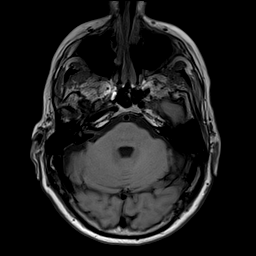
[im 64/160]
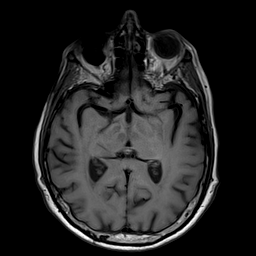
[im 96/160]
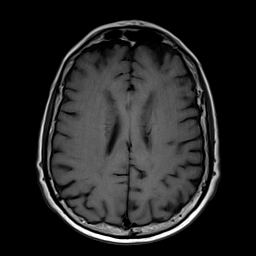
[im 128/160]
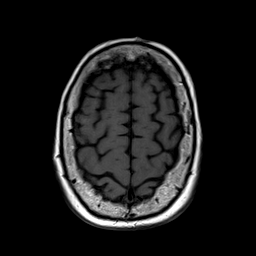
[im 160/160]
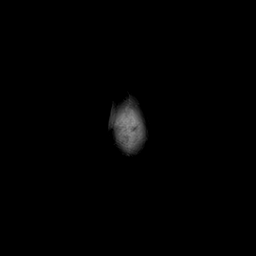

[Series 16: T2 post-contrast · coronal · 3.0mm · 0.57mm/px · 2 of 50 slices shown (1 of 2)]
[im 1/50]
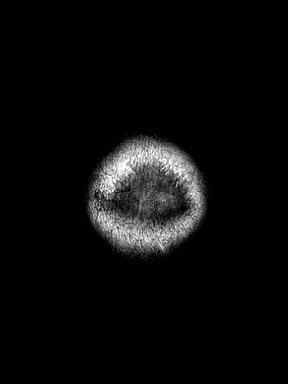
[im 50/50]
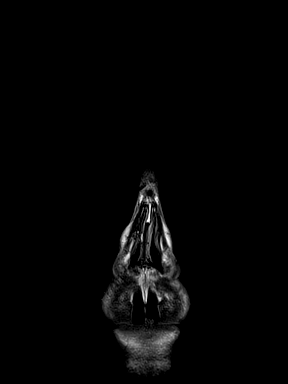

[Series 17: T2 post-contrast · axial · 1.0mm · 0.86mm/px · z∈[-74,+82]mm · 6 of 160 slices shown (2 of 2)]
[im 1/160]
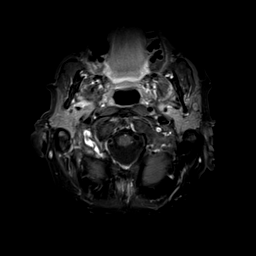
[im 32/160]
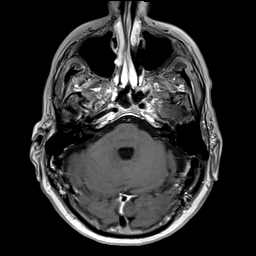
[im 64/160]
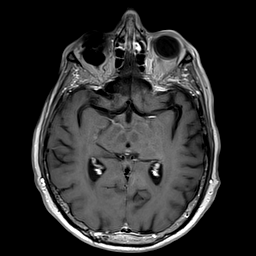
[im 96/160]
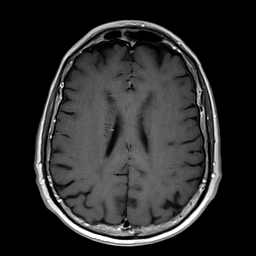
[im 128/160]
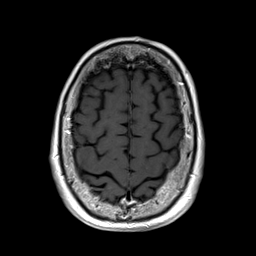
[im 160/160]
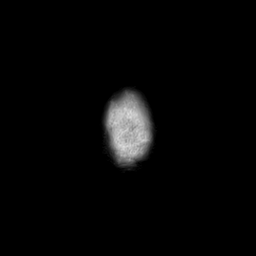

[Series 18: T1 post-contrast · axial · 1.0mm · 0.75mm/px · z∈[-75,+84]mm · 6 of 160 slices shown (1 of 3)]
[im 1/160]
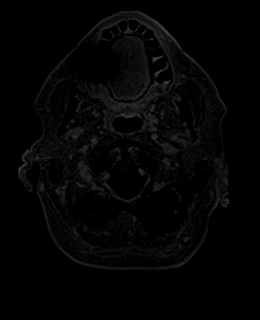
[im 32/160]
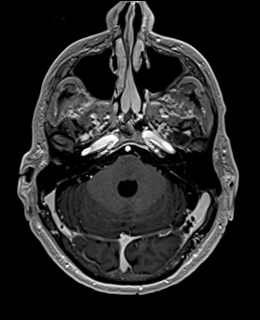
[im 64/160]
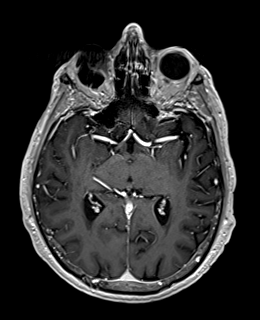
[im 96/160]
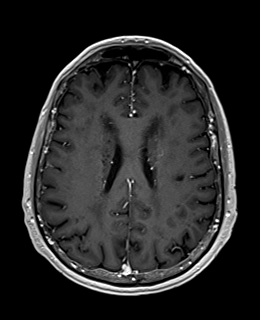
[im 128/160]
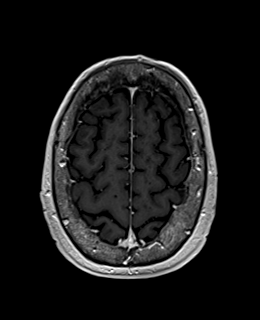
[im 160/160]
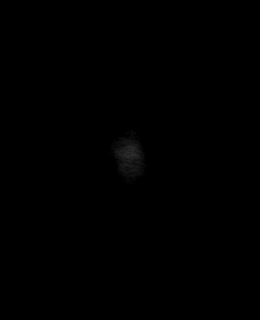

[Series 19: T1 post-contrast · coronal · 3.0mm · 0.57mm/px · 2 of 51 slices shown (2 of 3)]
[im 1/51]
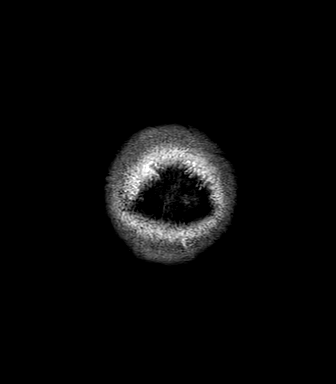
[im 51/51]
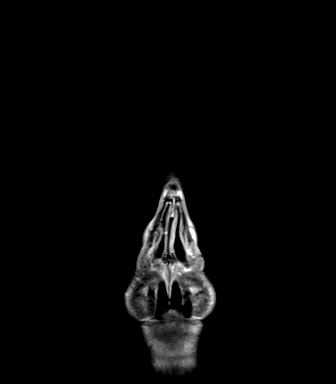

[Series 20: FLAIR post-contrast · sagittal · 3.0mm · 0.75mm/px · 1 of 39 slices shown]
[im 1/39]
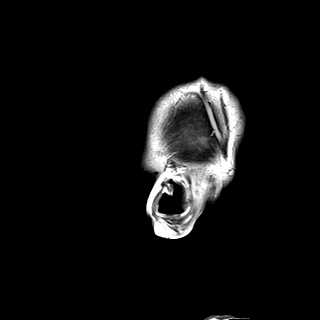

[Series 21: T1 post-contrast · coronal · 3.0mm · 0.35mm/px · 1 of 38 slices shown (3 of 3)]
[im 1/38]
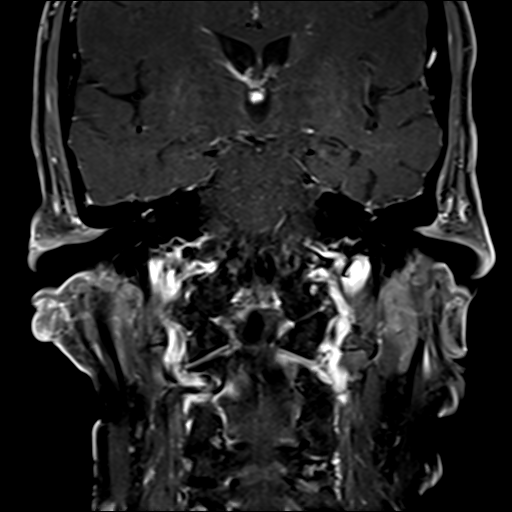

[Series 22: T1 fat-sat post-contrast · axial · 3.0mm · 0.31mm/px · 1 of 35 slices shown]
[im 1/35]
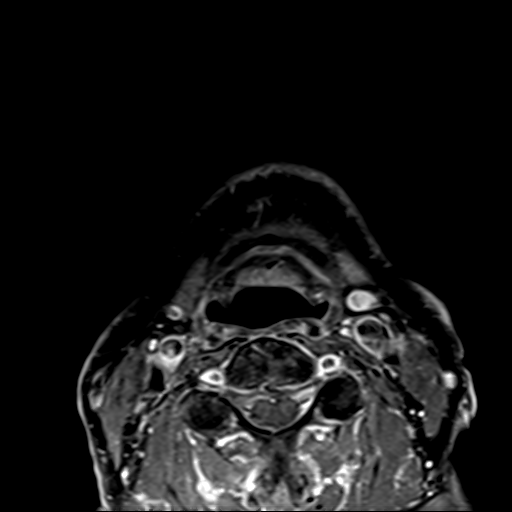

[44 of 48 positions shown; findings below may reference images not displayed]

FINDINGS: Limited intracranial/Trigeminal nerves: Unchanged right V2 segment
enhancement at the Meckel's cave. No convincing neural abnormality
before or after this point, including at the brainstem, cisterns, or
skull base/pterygoid palatine fossae. The infraorbital nerves are
symmetric and unremarkable. No findings on the left. No asymmetry or
abnormality of the muscles of mastication. Brain described
separately

Vascular: Unremarkable

Sinuses/Orbits: Small retention cysts in the inferior maxillary
sinuses. Endoscopic sinus surgery on the left. Enucleation on the
right with prosthesis.

Soft tissues: Negative

Osseous: No evidence of marrow lesion.
IMPRESSION: Reassuring scan with stable mild enhancement of the right V2
division of the trigeminal nerve at the Meckel's cave.

Brain reported separately.

## 2021-10-19 MED ORDER — GADOBENATE DIMEGLUMINE 529 MG/ML IV SOLN
12.0000 mL | Freq: Once | INTRAVENOUS | Status: AC | PRN
  Administered 2021-10-19: 12 mL via INTRAVENOUS

## 2021-10-22 ENCOUNTER — Inpatient Hospital Stay: Payer: Medicare PPO | Attending: Family Medicine

## 2021-10-22 DIAGNOSIS — C61 Malignant neoplasm of prostate: Secondary | ICD-10-CM | POA: Diagnosis not present

## 2021-10-22 DIAGNOSIS — Z79818 Long term (current) use of other agents affecting estrogen receptors and estrogen levels: Secondary | ICD-10-CM | POA: Diagnosis not present

## 2021-10-22 DIAGNOSIS — F325 Major depressive disorder, single episode, in full remission: Secondary | ICD-10-CM | POA: Diagnosis not present

## 2021-10-22 DIAGNOSIS — M810 Age-related osteoporosis without current pathological fracture: Secondary | ICD-10-CM | POA: Diagnosis not present

## 2021-10-22 DIAGNOSIS — N393 Stress incontinence (female) (male): Secondary | ICD-10-CM | POA: Diagnosis not present

## 2021-10-22 DIAGNOSIS — N529 Male erectile dysfunction, unspecified: Secondary | ICD-10-CM | POA: Diagnosis not present

## 2021-10-22 DIAGNOSIS — E785 Hyperlipidemia, unspecified: Secondary | ICD-10-CM | POA: Diagnosis not present

## 2021-10-22 DIAGNOSIS — R030 Elevated blood-pressure reading, without diagnosis of hypertension: Secondary | ICD-10-CM | POA: Diagnosis not present

## 2021-10-22 DIAGNOSIS — Z7982 Long term (current) use of aspirin: Secondary | ICD-10-CM | POA: Diagnosis not present

## 2021-10-23 ENCOUNTER — Encounter: Payer: Self-pay | Admitting: Urology

## 2021-10-23 DIAGNOSIS — M62838 Other muscle spasm: Secondary | ICD-10-CM | POA: Diagnosis not present

## 2021-10-23 DIAGNOSIS — M6281 Muscle weakness (generalized): Secondary | ICD-10-CM | POA: Diagnosis not present

## 2021-10-23 DIAGNOSIS — N393 Stress incontinence (female) (male): Secondary | ICD-10-CM | POA: Diagnosis not present

## 2021-10-23 NOTE — Progress Notes (Signed)
Spoke w/ patient, verified identity, and began nursing interview by phone. Patient reports RT cheek/ facial numbness. No other issues reported to me at this time. ? ?Meaningful use complete. ? ?Patient reminded of his 9:30pm-10/24/21 telephone appointment w/ Ashlyn Bruning PA-C. I left my extension [phone] in case patient needs anything. Patient verbalized understanding of information. ? ?Patient contact  [phone] ?

## 2021-10-24 ENCOUNTER — Ambulatory Visit
Admission: RE | Admit: 2021-10-24 | Discharge: 2021-10-24 | Disposition: A | Discharge status: Home / Self Care | Payer: Medicare PPO | Source: Ambulatory Visit | Attending: Urology | Admitting: Urology

## 2021-10-24 DIAGNOSIS — Z08 Encounter for follow-up examination after completed treatment for malignant neoplasm: Secondary | ICD-10-CM | POA: Diagnosis not present

## 2021-10-24 DIAGNOSIS — C44320 Squamous cell carcinoma of skin of unspecified parts of face: Secondary | ICD-10-CM | POA: Insufficient documentation

## 2021-10-24 DIAGNOSIS — C7250 Malignant neoplasm of unspecified cranial nerve: Secondary | ICD-10-CM | POA: Insufficient documentation

## 2021-10-24 DIAGNOSIS — G500 Trigeminal neuralgia: Secondary | ICD-10-CM | POA: Diagnosis not present

## 2021-10-24 NOTE — Progress Notes (Signed)
?Radiation Oncology         [phone] ?________________________________ ? ?Name: Paul Vasquez. MRN: [id number]  ?Date: 10/24/2021  DOB: 02-Dec-1945 ? ?Post-treatment Follow-Up Note ? ?CC: [name], MD  [name], MD ? ?Diagnosis:   76 y.o. 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 ? ?  ICD-10-CM   ?1. Squamous cell carcinoma of face  C44.320   ?  ?2. Cancer of cranial nerves (HCC)  C72.50   ?  ? ? ?Interval Since Last Radiation:  10 months ?4/29, 5/2, 5/4, 5/6 and 12/18/2020//SRS: ?The right 10 mm Meckel[place] cave trigeminal nerve target was treated to a prescription dose of 5 Gy which was repeated for a total of 5 fractions to a total dose of 25 Gy. ? ?05/18/2019 - 06/28/2019: Right Temple / 60 Gy in 30 fractions ? ?Narrative:  I spoke with the patient to conduct his routine scheduled 3 month follow up visit via telephone to spare the patient unnecessary potential exposure in the healthcare setting during the current COVID-19 pandemic.  The patient was notified in advance and gave permission to proceed with this visit format. ? ?He tolerated radiation treatment relatively well but continues with dense numbness in the V2 distribution. On 07/09/21, he called with concerns of an increase in pressure and numbness felt in his face and towards his lip so we moved his previously scheduled follow up scan up sooner, in light of his prior disease recurrence. ?                             ?On review of systems, the patient states that he is doing well in general.  He continues with the facial numbness but this has not progressively worsened.  He reports that the symptoms remain variable, with some days better than others, occasionally with more pronounced numbness on the right side of his face and lip.  He denies any headaches, nausea, vomiting, dizziness or imbalance.   Overall, he remains pleased with his progress to date. And currently without complaints  He had a recent follow-up MRI brain and trigeminal nerve study on 07/18/2021 which confirms no progressive disease at the right V2 segment of the trigeminal nerve and no evidence of intracranial involvement.  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 His imaging was reviewed in the recent multidisciplinary brain tumor board and the micro-hemorrhages were not felt to be concerning as they are usually asymptomatic and these patients do have greater tendency to hemorrhage spontaneously or with anticoagulation. He is currently on ASA 81 mg daily only for history of CVA in 12/2019. We reviewed these findings today and I assured him that I would share the results with his neurologist, Dr. Leonie Man, to keep him in the loop in case he has any additional recommendations. ? ? ?ALLERGIES:  is allergic to amoxicillin, sulfamethoxazole, and sulfamethoxazole-trimethoprim. ? ?Meds: ?Current Outpatient Medications  ?Medication Sig Dispense Refill  ? aspirin EC 81 MG tablet Take 81 mg by mouth daily. Swallow whole.    ? atorvastatin (LIPITOR) 40 MG tablet Take 1 tablet (40 mg total) by mouth daily. 90 tablet 0  ? calcium-vitamin D (OSCAL WITH D) 500-200 MG-UNIT tablet Take 1 tablet by mouth.     ?  escitalopram (LEXAPRO) 10 MG tablet Take 10 mg by mouth daily.    ? melatonin 1 MG TABS tablet Take 2 mg by mouth at bedtime.    ? NON FORMULARY Vitamin Code Grow Bone Calcium    ? NON FORMULARY Emiliano Dyer Immune Stimulator daily    ? NON FORMULARY Nature Sunshine Skeletal strength daily    ? NON FORMULARY Growth factor daily    ? NON FORMULARY Wheat grass daily    ? NON FORMULARY Q-absorb Co-Q10    ? NON FORMULARY Apex Energetics Adaptocrine    ? NON FORMULARY Apex Energetics Gabatone    ? NON FORMULARY Apex  Energetics AD-Pro (bitamin A and D)    ? NON FORMULARY [place] Prolent     ? Omega-3 Fatty Acids (OMEGA-3 EPA FISH OIL PO) Take by mouth.    ? Probiotic Product (SOLUBLE FIBER/PROBIOTICS PO) Take by mouth.    ? vitamin C (ASCORBIC ACID) 500 MG tablet Take 500 mg by mouth 2 (two) times daily.     ? ?No current facility-administered medications for this encounter.  ? ? ?Physical Findings: ?Unable to assess due to telephone follow-up visit format. ? ?Lab Findings: ?Lab Results  ?Component Value Date  ? WBC 4.0 12/31/2019  ? HGB 13.4 12/31/2019  ? HCT 40.1 12/31/2019  ? PLT 210 12/31/2019  ? ? ?Lab Results  ?Component Value Date  ? NA 140 12/31/2019  ? K 3.7 12/31/2019  ? CO2 24 12/31/2019  ? GLUCOSE 97 12/31/2019  ? BUN 20 11/30/2020  ? CREATININE 0.77 11/30/2020  ? BILITOT 0.9 12/30/2019  ? ALKPHOS 56 12/30/2019  ? AST 14 (L) 12/30/2019  ? ALT 13 12/30/2019  ? PROT 5.5 (L) 12/30/2019  ? ALBUMIN 3.4 (L) 12/30/2019  ? CALCIUM 8.6 (L) 12/31/2019  ? ANIONGAP 8 12/31/2019  ? ? ?Radiographic Findings: ?MR Brain W Wo Contrast ? ?Result Date: 10/20/2021 ?CLINICAL DATA:  History of right temple skin cancer with right facial numbness after radiotherapy. EXAM: MRI HEAD WITHOUT AND WITH CONTRAST TECHNIQUE: Multiplanar, multiecho pulse sequences of the brain and surrounding structures were obtained without and with intravenous contrast. CONTRAST:  40m MULTIHANCE GADOBENATE DIMEGLUMINE 529 MG/ML IV SOLN COMPARISON:  11/24/2020 FINDINGS: Brain: Subcentimeter focus of restricted diffusion in the right cerebellum, not associated with enhancement. There is a background of extensive chronic small vessel ischemic gliosis in the hemispheric white matter and to a lesser extent in the pons. Redemonstrated innumerable remote micro hemorrhages mainly scattered along the lobar brain. Small area of more intense hemosiderin deposition in the parasagittal left frontal cortex. No hydrocephalus or collection. No abnormal intracranial enhancement.  Vascular: Normal flow voids and vascular enhancements Skull and upper cervical spine: Normal marrow signal Sinuses/Orbits: See dedicated face MRI. These results will be called to the ordering clinician or representative by the Radiologist Assistant, and communication documented in the PACS or [employer] IMPRESSION: 1. 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 2. 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 3. Face MRI described separately. Electronically Signed   By: JJorje GuildM.D.   On: 10/20/2021 04:35  ? ?MR FACE/TRIGEMINAL WO/W CM ? ?Result Date: 10/20/2021 ?CLINICAL DATA:  Right temple cutaneous squamous cell carcinoma with right facial numbness after radiotherapy EXAM: MRI FACE TRIGEMINAL WITHOUT AND WITH CONTRAST TECHNIQUE: Multiplanar, multi-echo pulse sequences of the face and surrounding structures, including thin-slice imaging of the trigeminal nerves, were acquired before and after intravenous contrast administration. CONTRAST:  56m MULTIHANCE GADOBENATE DIMEGLUMINE 529 MG/ML IV SOLN COMPARISON:  07/18/2021 FINDINGS: Limited intracranial/Trigeminal nerves: Unchanged right V2 segment enhancement at the [place] No convincing neural abnormality before or after this point, including at the brainstem, cisterns, or skull base/pterygoid palatine fossae. The infraorbital nerves are symmetric and unremarkable. No findings on the left. No asymmetry or abnormality of the muscles of mastication. Brain described separately Vascular: Unremarkable Sinuses/Orbits: Small retention cysts in the inferior maxillary sinuses. Endoscopic sinus surgery on the left. Enucleation on the right with prosthesis. Soft tissues: Negative Osseous: No evidence of marrow lesion. IMPRESSION: Reassuring scan with stable mild enhancement of the right V2 division of the trigeminal nerve [facility]. Brain reported  separately. Electronically Signed   By: JJorje GuildM.D.   On: 10/20/2021 04:30   ? ? ?Impression/Plan: This visit was conducted via telephone to spare the patient unnecessary potential exposure in the hea

## 2021-10-24 NOTE — Progress Notes (Signed)
Thank you for the update. In patients with either microhemorrhages or CAA with a prior stroke or cardiovascular history, aspirin is okay to continue as benefit outweighs risk. It is also important to manage blood pressure and ensure adequate control of her risk factors. I do not see any specific concerns from our standpoint at this time.  Please let me know if you have any questions! Thank you again for the FYI! ?

## 2021-10-31 DIAGNOSIS — F4381 Prolonged grief disorder: Secondary | ICD-10-CM | POA: Diagnosis not present

## 2021-11-13 DIAGNOSIS — M818 Other osteoporosis without current pathological fracture: Secondary | ICD-10-CM | POA: Diagnosis not present

## 2021-11-13 DIAGNOSIS — R7303 Prediabetes: Secondary | ICD-10-CM | POA: Diagnosis not present

## 2021-11-13 DIAGNOSIS — M62838 Other muscle spasm: Secondary | ICD-10-CM | POA: Diagnosis not present

## 2021-11-13 DIAGNOSIS — Z8673 Personal history of transient ischemic attack (TIA), and cerebral infarction without residual deficits: Secondary | ICD-10-CM | POA: Diagnosis not present

## 2021-11-13 DIAGNOSIS — N393 Stress incontinence (female) (male): Secondary | ICD-10-CM | POA: Diagnosis not present

## 2021-11-13 DIAGNOSIS — M6281 Muscle weakness (generalized): Secondary | ICD-10-CM | POA: Diagnosis not present

## 2021-11-20 DIAGNOSIS — R202 Paresthesia of skin: Secondary | ICD-10-CM | POA: Diagnosis not present

## 2021-11-20 DIAGNOSIS — H3530 Unspecified macular degeneration: Secondary | ICD-10-CM | POA: Diagnosis not present

## 2021-11-20 DIAGNOSIS — F321 Major depressive disorder, single episode, moderate: Secondary | ICD-10-CM | POA: Diagnosis not present

## 2021-11-20 DIAGNOSIS — R7303 Prediabetes: Secondary | ICD-10-CM | POA: Diagnosis not present

## 2021-11-20 DIAGNOSIS — M818 Other osteoporosis without current pathological fracture: Secondary | ICD-10-CM | POA: Diagnosis not present

## 2021-11-20 DIAGNOSIS — Z8673 Personal history of transient ischemic attack (TIA), and cerebral infarction without residual deficits: Secondary | ICD-10-CM | POA: Diagnosis not present

## 2021-11-20 DIAGNOSIS — G510 Bell's palsy: Secondary | ICD-10-CM | POA: Diagnosis not present

## 2021-11-20 DIAGNOSIS — I872 Venous insufficiency (chronic) (peripheral): Secondary | ICD-10-CM | POA: Diagnosis not present

## 2021-11-28 DIAGNOSIS — F4381 Prolonged grief disorder: Secondary | ICD-10-CM | POA: Diagnosis not present

## 2021-12-17 DIAGNOSIS — F4381 Prolonged grief disorder: Secondary | ICD-10-CM | POA: Diagnosis not present

## 2021-12-24 DIAGNOSIS — N393 Stress incontinence (female) (male): Secondary | ICD-10-CM | POA: Diagnosis not present

## 2021-12-24 DIAGNOSIS — M6281 Muscle weakness (generalized): Secondary | ICD-10-CM | POA: Diagnosis not present

## 2021-12-24 DIAGNOSIS — M62838 Other muscle spasm: Secondary | ICD-10-CM | POA: Diagnosis not present

## 2021-12-25 DIAGNOSIS — F4381 Prolonged grief disorder: Secondary | ICD-10-CM | POA: Diagnosis not present

## 2021-12-31 ENCOUNTER — Telehealth: Payer: Self-pay | Admitting: Radiation Therapy

## 2021-12-31 DIAGNOSIS — H40052 Ocular hypertension, left eye: Secondary | ICD-10-CM | POA: Diagnosis not present

## 2021-12-31 DIAGNOSIS — Z970 Presence of artificial eye: Secondary | ICD-10-CM | POA: Diagnosis not present

## 2021-12-31 DIAGNOSIS — H353122 Nonexudative age-related macular degeneration, left eye, intermediate dry stage: Secondary | ICD-10-CM | POA: Diagnosis not present

## 2021-12-31 DIAGNOSIS — H04122 Dry eye syndrome of left lacrimal gland: Secondary | ICD-10-CM | POA: Diagnosis not present

## 2021-12-31 DIAGNOSIS — H2512 Age-related nuclear cataract, left eye: Secondary | ICD-10-CM | POA: Diagnosis not present

## 2021-12-31 NOTE — Telephone Encounter (Signed)
Received a call from Mr. Sweda complaining of increased sensitivity and feeling of "puffiness" in his lip, rt side. He is interested in getting another scan now instead of waiting till September.    Mont Dutton R.T.(R)(T) Radiation Special Procedures Navigator

## 2022-01-01 ENCOUNTER — Other Ambulatory Visit: Payer: Self-pay | Admitting: Radiation Therapy

## 2022-01-01 ENCOUNTER — Telehealth: Payer: Self-pay | Admitting: Radiation Therapy

## 2022-01-01 DIAGNOSIS — C7951 Secondary malignant neoplasm of bone: Secondary | ICD-10-CM

## 2022-01-01 DIAGNOSIS — F4381 Prolonged grief disorder: Secondary | ICD-10-CM | POA: Diagnosis not present

## 2022-01-01 NOTE — Telephone Encounter (Signed)
Left a detailed voicemail about upcoming brain MRI appointment and telephone follow-up with Ashlyn review the results. My contact information was included in case he has questions or concerns.   Mont Dutton R.T.(R)(T) Radiation Special Procedures Navigator

## 2022-01-03 ENCOUNTER — Ambulatory Visit
Admission: RE | Admit: 2022-01-03 | Discharge: 2022-01-03 | Disposition: A | Discharge status: Home / Self Care | Payer: Medicare PPO | Source: Ambulatory Visit | Attending: Radiation Oncology | Admitting: Radiation Oncology

## 2022-01-03 DIAGNOSIS — Z85828 Personal history of other malignant neoplasm of skin: Secondary | ICD-10-CM | POA: Diagnosis not present

## 2022-01-03 DIAGNOSIS — C7951 Secondary malignant neoplasm of bone: Secondary | ICD-10-CM

## 2022-01-03 DIAGNOSIS — H4720 Unspecified optic atrophy: Secondary | ICD-10-CM | POA: Diagnosis not present

## 2022-01-03 DIAGNOSIS — R200 Anesthesia of skin: Secondary | ICD-10-CM | POA: Diagnosis not present

## 2022-01-03 MED ORDER — GADOBENATE DIMEGLUMINE 529 MG/ML IV SOLN
12.0000 mL | Freq: Once | INTRAVENOUS | Status: AC | PRN
  Administered 2022-01-03: 12 mL via INTRAVENOUS

## 2022-01-04 ENCOUNTER — Encounter: Payer: Self-pay | Admitting: Urology

## 2022-01-04 ENCOUNTER — Telehealth: Payer: Self-pay | Admitting: Radiation Therapy

## 2022-01-04 NOTE — Progress Notes (Signed)
Telephone appointment. I verified patient's identity and began nursing interview. Patient is doing well. No issues reported at this time.  Meaningful use complete. I-PSS score of 3-mild No urinary management medications. Urology appointment- June, 2023  Reminded patient of his 9:30am-01/08/22 telephone appointment w/ Ashlyn Bruning PA-C. I left my extension [phone] in case patient needs anything. Patient verbalized understanding.  Patient preferred contact [phone]

## 2022-01-04 NOTE — Telephone Encounter (Signed)
Left a voicemail for Mr. Deman about his recent MRI results. I included my contact information in case he has other questions. Plans are to rescan in 6 months unless he has other concerns prior to that time.   Mont Dutton R.T.(R)(T) Radiation Special Procedures Navigator

## 2022-01-08 ENCOUNTER — Ambulatory Visit
Admission: RE | Admit: 2022-01-08 | Discharge: 2022-01-08 | Disposition: A | Discharge status: Home / Self Care | Payer: Medicare PPO | Source: Ambulatory Visit | Attending: Urology | Admitting: Urology

## 2022-01-08 ENCOUNTER — Telehealth: Payer: Self-pay

## 2022-01-08 NOTE — Telephone Encounter (Signed)
Telephone appointment cancellation. I called patient and left a voicemail informing the patient of a schedule change to his 9:30am-01/08/22 telephone appointment w/ Ashlyn Bruning PA-C. I let my extension [phone] and requested that patient return my call, so that we may get this appointment rescheduled as soon as possible.

## 2022-01-09 DIAGNOSIS — F4381 Prolonged grief disorder: Secondary | ICD-10-CM | POA: Diagnosis not present

## 2022-01-15 DIAGNOSIS — C61 Malignant neoplasm of prostate: Secondary | ICD-10-CM | POA: Diagnosis not present

## 2022-01-16 DIAGNOSIS — F4381 Prolonged grief disorder: Secondary | ICD-10-CM | POA: Diagnosis not present

## 2022-01-22 DIAGNOSIS — M62838 Other muscle spasm: Secondary | ICD-10-CM | POA: Diagnosis not present

## 2022-01-22 DIAGNOSIS — M6281 Muscle weakness (generalized): Secondary | ICD-10-CM | POA: Diagnosis not present

## 2022-01-22 DIAGNOSIS — F4381 Prolonged grief disorder: Secondary | ICD-10-CM | POA: Diagnosis not present

## 2022-01-22 DIAGNOSIS — N393 Stress incontinence (female) (male): Secondary | ICD-10-CM | POA: Diagnosis not present

## 2022-01-30 DIAGNOSIS — F4381 Prolonged grief disorder: Secondary | ICD-10-CM | POA: Diagnosis not present

## 2022-02-05 DIAGNOSIS — Z85820 Personal history of malignant melanoma of skin: Secondary | ICD-10-CM | POA: Diagnosis not present

## 2022-02-05 DIAGNOSIS — L570 Actinic keratosis: Secondary | ICD-10-CM | POA: Diagnosis not present

## 2022-02-05 DIAGNOSIS — D225 Melanocytic nevi of trunk: Secondary | ICD-10-CM | POA: Diagnosis not present

## 2022-02-05 DIAGNOSIS — D485 Neoplasm of uncertain behavior of skin: Secondary | ICD-10-CM | POA: Diagnosis not present

## 2022-02-05 DIAGNOSIS — Z85828 Personal history of other malignant neoplasm of skin: Secondary | ICD-10-CM | POA: Diagnosis not present

## 2022-02-05 DIAGNOSIS — L814 Other melanin hyperpigmentation: Secondary | ICD-10-CM | POA: Diagnosis not present

## 2022-02-05 DIAGNOSIS — L821 Other seborrheic keratosis: Secondary | ICD-10-CM | POA: Diagnosis not present

## 2022-02-05 DIAGNOSIS — L565 Disseminated superficial actinic porokeratosis (DSAP): Secondary | ICD-10-CM | POA: Diagnosis not present

## 2022-02-06 DIAGNOSIS — F4381 Prolonged grief disorder: Secondary | ICD-10-CM | POA: Diagnosis not present

## 2022-04-11 ENCOUNTER — Other Ambulatory Visit: Payer: Self-pay | Admitting: Radiation Therapy

## 2022-04-11 DIAGNOSIS — S0430XS Injury of trigeminal nerve, unspecified side, sequela: Secondary | ICD-10-CM

## 2022-04-23 ENCOUNTER — Encounter (INDEPENDENT_AMBULATORY_CARE_PROVIDER_SITE_OTHER): Payer: Self-pay | Admitting: Ophthalmology

## 2022-04-23 ENCOUNTER — Ambulatory Visit (INDEPENDENT_AMBULATORY_CARE_PROVIDER_SITE_OTHER): Payer: Medicare PPO | Admitting: Ophthalmology

## 2022-04-23 DIAGNOSIS — H2512 Age-related nuclear cataract, left eye: Secondary | ICD-10-CM

## 2022-04-23 DIAGNOSIS — H353122 Nonexudative age-related macular degeneration, left eye, intermediate dry stage: Secondary | ICD-10-CM | POA: Diagnosis not present

## 2022-04-23 NOTE — Assessment & Plan Note (Signed)
The nature of cataract was discussed with the patient as well as the elective nature of surgery. The patient was reassured that surgery at a later date does not put the patient at risk for a worse outcome. It was emphasized that the need for surgery is dictated by the patient's quality of life as influenced by the cataract. Patient was instructed to maintain close follow up with their general eye care doctor.  Follow-up with [facility]

## 2022-04-23 NOTE — Assessment & Plan Note (Signed)

## 2022-04-23 NOTE — Progress Notes (Signed)
04/23/2022     CHIEF COMPLAINT Patient presents for  Chief Complaint  Patient presents with   Macular Degeneration      HISTORY OF PRESENT ILLNESS: Paul Vasquez. is a 76 y.o. male who presents to the clinic today for:   HPI   Macular degeneration of left eye 1 year fu os oct Pt states his vision has been stable Pt denies any new floaters or FOL Pt complains of not being about to see his dash bored as clear as he used to.  Last edited by Morene Rankins, CMA on 04/23/2022  9:20 AM.      Referring physician: Warden Fillers, MD [address] [place],  [place] [address]  HISTORICAL INFORMATION:   Selected notes from the MEDICAL RECORD NUMBER    Lab Results  Component Value Date   HGBA1C 6.1 (H) 12/30/2019     CURRENT MEDICATIONS: No current outpatient medications on file. (Ophthalmic Drugs)   No current facility-administered medications for this visit. (Ophthalmic Drugs)   Current Outpatient Medications (Other)  Medication Sig   aspirin EC 81 MG tablet Take 81 mg by mouth daily. Swallow whole.   atorvastatin (LIPITOR) 40 MG tablet Take 1 tablet (40 mg total) by mouth daily.   calcium-vitamin D (OSCAL WITH D) 500-200 MG-UNIT tablet Take 1 tablet by mouth.    escitalopram (LEXAPRO) 10 MG tablet Take 10 mg by mouth daily.   melatonin 1 MG TABS tablet Take 2 mg by mouth at bedtime.   NON FORMULARY Vitamin Code Grow Bone Calcium   NON FORMULARY Nature Sunshine Immune Stimulator daily   NON FORMULARY Nature Sunshine Skeletal strength daily   NON FORMULARY Growth factor daily   NON FORMULARY Wheat grass daily   NON FORMULARY Q-absorb Co-Q10   NON FORMULARY Apex Energetics Adaptocrine   NON FORMULARY Apex Energetics Gabatone   NON FORMULARY Apex Energetics AD-Pro (bitamin A and D)   NON FORMULARY [place] Prolent    Omega-3 Fatty Acids (OMEGA-3 EPA FISH OIL PO) Take by mouth.   Probiotic Product (SOLUBLE FIBER/PROBIOTICS PO) Take by mouth.    vitamin C (ASCORBIC ACID) 500 MG tablet Take 500 mg by mouth 2 (two) times daily.    No current facility-administered medications for this visit. (Other)      REVIEW OF SYSTEMS: ROS   Negative for: Constitutional, Gastrointestinal, Neurological, Skin, Genitourinary, Musculoskeletal, HENT, Endocrine, Cardiovascular, Eyes, Respiratory, Psychiatric, Allergic/Imm, Heme/Lymph Last edited by Morene Rankins, CMA on 04/23/2022  9:20 AM.       ALLERGIES Allergies  Allergen Reactions   Amoxicillin Other (See Comments)    un   Sulfamethoxazole Other (See Comments)    un   Sulfamethoxazole-Trimethoprim Other (See Comments)    PAST MEDICAL HISTORY Past Medical History:  Diagnosis Date   Macular degeneration 02/2020   start   Melanoma in situ of ear, right ([place])    Prostate cancer ([place])    Skin cancer    Past Surgical History:  Procedure Laterality Date   MOHS SURGERY     PROSTATECTOMY      FAMILY HISTORY Family History  Problem Relation Age of Onset   Multiple myeloma Father     SOCIAL HISTORY Social History   Tobacco Use   Smoking status: Never   Smokeless tobacco: Never  Vaping Use   Vaping Use: Never used  Substance Use Topics   Alcohol use: Not Currently   Drug use: Never  OPHTHALMIC EXAM:  Base Eye Exam     Visual Acuity (ETDRS)       Right Left   Dist [place] NLP 20/20 -1    Correction: Glasses         Tonometry (Tonopen, 9:22 AM)       Right Left   Pressure  15         Visual Fields       Left Right    Full    Restrictions  Total superior temporal, inferior temporal, superior nasal, inferior nasal deficiencies         Neuro/Psych     Oriented x3: Yes   Mood/Affect: Normal         Dilation     Left eye: 2.5% Phenylephrine, 1.0% Mydriacyl @ 9:21 AM           Slit Lamp and Fundus Exam     External Exam       Right Left   External Normal Normal         Slit Lamp Exam       Right Left   Lids/Lashes  Normal Normal   Conjunctiva/Sclera White and quiet White and quiet   Cornea  Clear   Anterior Chamber Prosthesis  Deep and quiet   Iris  Round and reactive   Lens  3+ Nuclear sclerosis   Anterior Vitreous  Normal         Fundus Exam       Right Left   Posterior Vitreous prosthesis Posterior vitreous detachment   Disc  Normal   C/D Ratio  0.4   Macula  Intermediate age related macular degeneration, no macular thickening, Retinal pigment epithelial mottling, no hemorrhage, no exudates, Hard drusen, Soft drusen   Vessels  Normal   Periphery  Normal, 78d, 25d,, no masses or tumors or nevi            IMAGING AND PROCEDURES  Imaging and Procedures for 04/23/22  OCT, Retina - [facility] - Both Eyes       Left Eye Quality was good. Scan locations included subfoveal. Central Foveal Thickness: 327. Findings include no IRF, no SRF, retinal drusen .   Notes No signs of CNVM OS stable over time             ASSESSMENT/PLAN:  Intermediate stage nonexudative age-related macular degeneration of left eye The nature of age--related macular degeneration was discussed with the patient as well as the distinction between dry and wet types. Checking an Amsler Grid daily with advice to return immediately should a distortion develop, was given to the patient. The patient 's smoking status now and in the past was determined and advice based on the AREDS study was provided regarding the consumption of antioxidant supplements. AREDS 2 vitamin formulation was recommended. Consumption of dark leafy vegetables and fresh fruits of various colors was recommended. Treatment modalities for wet macular degeneration particularly the use of intravitreal injections of anti-blood vessel growth factors was discussed with the patient. Avastin, Lucentis, and Eylea are the available options. On occasion, therapy includes the use of photodynamic therapy and thermal laser. Stressed to the patient do not rub eyes.  Patient  was advised to check Amsler Grid daily and return immediately if changes are noted. Instructions on using the grid were given to the patient. All patient questions were answered.    Cataract, nuclear sclerotic, left eye The nature of cataract was discussed with the patient as well as the elective nature of  surgery. The patient was reassured that surgery at a later date does not put the patient at risk for a worse outcome. It was emphasized that the need for surgery is dictated by the patient's quality of life as influenced by the cataract. Patient was instructed to maintain close follow up with their general eye care doctor.  Follow-up with [facility]     ICD-10-CM   1. Intermediate stage nonexudative age-related macular degeneration of left eye  H35.3122 OCT, Retina - [facility] - Both Eyes    2. Cataract, nuclear sclerotic, left eye  H25.12       1.  OS no sign of progression of AMD.  Patient continues on oral AREDS 2 supplementation used  2.  Cataract OS, nighttime vision difficulties and dark rainy days difficulties likes dark sunglasses symptoms reviewed with the patient so he knows what the symptoms are from despite the 20/20 vision OS  3.  Ophthalmic Meds Ordered this visit:  No orders of the defined types were placed in this encounter.      Return in about 1 year (around 04/24/2023) for dilate, OS, OCT.  There are no Patient Instructions on file for this visit.   Explained the diagnoses, plan, and follow up with the patient and they expressed understanding.  Patient expressed understanding of the importance of proper follow up care.   Clent Demark Tresean Mattix M.D. Diseases & Surgery of the Retina and Vitreous [facility] [place] 04/23/22     Abbreviations: M myopia (nearsighted); A astigmatism; H hyperopia (farsighted); P presbyopia; Mrx spectacle prescription;  CTL contact lenses; OD right eye; OS left eye; OU both eyes  XT exotropia; ET esotropia; PEK punctate  epithelial keratitis; PEE punctate epithelial erosions; DES dry eye syndrome; MGD meibomian gland dysfunction; ATs artificial tears; PFAT's preservative free artificial tears; [place] nuclear sclerotic cataract; PSC posterior subcapsular cataract; ERM epi-retinal membrane; PVD posterior vitreous detachment; RD retinal detachment; DM diabetes mellitus; DR diabetic retinopathy; NPDR non-proliferative diabetic retinopathy; PDR proliferative diabetic retinopathy; CSME clinically significant macular edema; DME diabetic macular edema; dbh dot blot hemorrhages; CWS cotton wool spot; POAG primary open angle glaucoma; C/D cup-to-disc ratio; HVF humphrey visual field; GVF goldmann visual field; OCT optical coherence tomography; IOP intraocular pressure; BRVO Branch retinal vein occlusion; CRVO central retinal vein occlusion; CRAO central retinal artery occlusion; BRAO branch retinal artery occlusion; RT retinal tear; SB scleral buckle; PPV pars plana vitrectomy; VH Vitreous hemorrhage; PRP panretinal laser photocoagulation; IVK intravitreal kenalog; VMT vitreomacular traction; MH Macular hole;  NVD neovascularization of the disc; NVE neovascularization elsewhere; AREDS age related eye disease study; ARMD age related macular degeneration; POAG primary open angle glaucoma; EBMD epithelial/anterior basement membrane dystrophy; ACIOL anterior chamber intraocular lens; IOL intraocular lens; PCIOL posterior chamber intraocular lens; Phaco/IOL phacoemulsification with intraocular lens placement; [place] photorefractive keratectomy; LASIK laser assisted in situ keratomileusis; HTN hypertension; DM diabetes mellitus; COPD chronic obstructive pulmonary disease

## 2022-07-10 ENCOUNTER — Ambulatory Visit
Admission: RE | Admit: 2022-07-10 | Discharge: 2022-07-10 | Disposition: A | Discharge status: Home / Self Care | Payer: Medicare PPO | Source: Ambulatory Visit | Attending: Radiation Oncology | Admitting: Radiation Oncology

## 2022-07-10 DIAGNOSIS — S0430XS Injury of trigeminal nerve, unspecified side, sequela: Secondary | ICD-10-CM

## 2022-07-10 MED ORDER — GADOPICLENOL 0.5 MMOL/ML IV SOLN
6.0000 mL | Freq: Once | INTRAVENOUS | Status: AC | PRN
  Administered 2022-07-10: 6 mL via INTRAVENOUS

## 2022-07-17 ENCOUNTER — Encounter: Payer: Self-pay | Admitting: Urology

## 2022-07-17 ENCOUNTER — Ambulatory Visit
Admission: RE | Admit: 2022-07-17 | Discharge: 2022-07-17 | Disposition: A | Discharge status: Home / Self Care | Payer: Medicare PPO | Source: Ambulatory Visit | Attending: Urology | Admitting: Urology

## 2022-07-17 DIAGNOSIS — C7250 Malignant neoplasm of unspecified cranial nerve: Secondary | ICD-10-CM | POA: Diagnosis not present

## 2022-07-17 DIAGNOSIS — C44320 Squamous cell carcinoma of skin of unspecified parts of face: Secondary | ICD-10-CM | POA: Diagnosis not present

## 2022-07-17 NOTE — Progress Notes (Signed)
Telephone nursing appointment for patient to receive most recent MRI results from 07/10/22. I verified patient's identity and began nursing interview. Patient reports numbness to the RT side of face since receiving radiation Tx's. No other issues reported at this time.   Meaningful use complete.   Patient aware of their 11:00am-07/17/22 telephone appointment w/ Ashlyn Bruning PA-C. I left my extension [phone] in case patient needs anything. Patient verbalized understanding. This concludes the nursing interview.   Patient contact [phone]     Leandra Kern, LPN

## 2022-07-17 NOTE — Progress Notes (Signed)
Radiation Oncology         [phone]) [phone] ________________________________  Name: Paul Vasquez. MRN: [id number]  Date: 07/17/2022  DOB: [birth year]  Post-treatment Follow-Up Note  CC: [facility], Jama Flavors, MD  Diagnosis:   76 y.o. 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    ICD-10-CM   1. 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  05/18/2019 - 06/28/2019: Right Temple / 60 Gy in 30 fractions  Narrative:  I spoke with the patient to conduct his routine scheduled 6 month follow up visit to review results of his recent MRI brain scan via telephone to spare the patient unnecessary potential exposure in the healthcare setting during the current COVID-19 pandemic.  The patient was notified in advance and gave permission to proceed with this visit format.  He tolerated radiation treatment relatively well but continues with dense numbness in the V2 distribution. On 07/09/21, he called with concerns of an increase in pressure and numbness felt in his face on the right and towards his lip so he had a short interval follow up scan for evaluation, in light of his prior disease recurrence. The scan on 07/18/22 confirmed no progressive disease at the right V2 segment of the trigeminal nerve and no evidence of intracranial involvement.  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 His imaging was reviewed in the multidisciplinary brain  tumor board and the micro-hemorrhages were not felt to be concerning as they are usually asymptomatic and these patients do have greater tendency to hemorrhage spontaneously or with anticoagulation. He is currently on ASA 81 mg daily only for history of CVA in 12/2019 so we shared the results with his neurologist, Dr. Leonie Man, to keep him in the loop regarding any additional recommendations.  The patient called with complaints of increased sensitivity and feeling of "puffiness" in his lip, rt side on 12/31/2021 so we again obtained an MRI scan earlier than planned for evaluation. The scan on 01/03/22 was again stable without any evidence of new or progressive disease and with stable enhancement and thickening of the right trigeminal nerve at [place].                              On review of systems, the patient states that he is doing well in general.  He continues with the right sided facial numbness but this has not progressively worsened.  He reports that the symptoms remain variable, with some days better than others, occasionally with more pronounced numbness on the right side of his face and lip.  He denies any headaches, nausea, vomiting, dizziness or imbalance.  Overall, he remains pleased with his progress to date. And currently without complaints  He had a recent follow-up MRI trigeminal nerve study on 07/10/22 which re-demonstrated a focal region of contrast enhancement within the right Meckel's cave, unchanged from prior exam and no new intracranial  contrast-enhancing lesions visualized. There was an unchanged focal region of enhancement along the right temporal calvarium, felt to be related to prior radiation treatment after reviewing his prior treatment planning scans fused with the new scan.  ALLERGIES:  is allergic to amoxicillin, sulfamethoxazole, and sulfamethoxazole-trimethoprim.  Meds: Current Outpatient Medications  Medication Sig Dispense Refill   aspirin EC 81 MG tablet Take 81  mg by mouth daily. Swallow whole.     atorvastatin (LIPITOR) 40 MG tablet Take 1 tablet (40 mg total) by mouth daily. 90 tablet 0   calcium-vitamin D (OSCAL WITH D) 500-200 MG-UNIT tablet Take 1 tablet by mouth.      escitalopram (LEXAPRO) 10 MG tablet Take 10 mg by mouth daily.     melatonin 1 MG TABS tablet Take 2 mg by mouth at bedtime.     NON FORMULARY Vitamin Code Grow Bone Calcium     NON FORMULARY Nature Sunshine Immune Stimulator daily     NON FORMULARY Nature Sunshine Skeletal strength daily     NON FORMULARY Growth factor daily     NON FORMULARY Wheat grass daily     NON FORMULARY Q-absorb Co-Q10     NON FORMULARY Apex Energetics Adaptocrine     NON FORMULARY Apex Energetics Gabatone     NON FORMULARY Apex Energetics AD-Pro (bitamin A and D)     NON FORMULARY [employer]esco Prolent      Omega-3 Fatty Acids (OMEGA-3 EPA FISH OIL PO) Take by mouth.     Probiotic Product (SOLUBLE FIBER/PROBIOTICS PO) Take by mouth.     vitamin C (ASCORBIC ACID) 500 MG tablet Take 500 mg by mouth 2 (two) times daily.      No current facility-administered medications for this encounter.    Physical Findings: Unable to assess due to telephone follow-up visit format.  Lab Findings: Lab Results  Component Value Date   WBC 4.0 12/31/2019   HGB 13.4 12/31/2019   HCT 40.1 12/31/2019   PLT 210 12/31/2019    Lab Results  Component Value Date   NA 140 12/31/2019   K 3.7 12/31/2019   CO2 24 12/31/2019   GLUCOSE 97 12/31/2019   BUN 20 11/30/2020   CREATININE 0.77 11/30/2020   BILITOT 0.9 12/30/2019   ALKPHOS 56 12/30/2019   AST 14 (L) 12/30/2019   ALT 13 12/30/2019   PROT 5.5 (L) 12/30/2019   ALBUMIN 3.4 (L) 12/30/2019   CALCIUM 8.6 (L) 12/31/2019   ANIONGAP 8 12/31/2019    Radiographic Findings: MR FACE/TRIGEMINAL WO/W CM  Result Date: 07/11/2022 CLINICAL DATA:  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 Right facial numbness.  EXAM: MRI FACE TRIGEMINAL WITHOUT AND WITH CONTRAST TECHNIQUE: Multiplanar, multi-echo pulse sequences of the face and surrounding structures, including thin-slice imaging of the trigeminal nerves, were acquired before and after intravenous contrast administration. CONTRAST:  6 ml Vueway COMPARISON:  None Available. FINDINGS: Limited intracranial/Trigeminal nerves: Redemonstrated focal region of contrast enhancement within the right Meckel's cave (series 11, image 12, 11), unchanged from prior exam. No new intracranial contrast-enhancing lesions are visualized. There is symmetric contrast enhancement along the alveolar foramen bilaterally. Vascular: Normal flow voids. Sinuses/Orbits: Right orbital enucleation. Mild mucosal thickening in the bilateral maxillary and ethmoid sinuses, unchanged from prior exam. There is mild asymmetric atrophy of the right optic nerve, likely related to prior enucleation. Soft tissues: No focal abnormality. Osseous: Unchanged focal region of enhanacement along the right temporal calvarium (series  11, image 1), possibly related to prior radiation therpy. Other: None. IMPRESSION: 1. Redemonstrated focal region of contrast enhancement within the right Meckel's cave, unchanged from prior exam. No new intracranial contrast-enhancing lesions are visualized. 2. Unchanged focal region of enhancement along the right temporal calvarium, possibly related to prior radiation therpay. Electronically Signed   By: Marin Roberts M.D.   On: 07/11/2022 12:56     Impression/Plan: This visit was conducted via telephone to spare the patient unnecessary potential exposure in the healthcare setting during the current COVID-19 pandemic.  76 y.o. 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 He has recovered well from the effects of his recent [facility] and remains without new complaints.  His recent MRI  trigeminal nerve study confirms a stable appearance with no progressive disease at the right V2 segment of the trigeminal nerve and no evidence of intracranial involvement.  We discussed the plan to continue with 6 month surveillance scans going forward and will connect by telephone to review results and recommendations from multidisciplinary tumor board following each scan. He appears to have a good understanding of our recommendations and is comfortable and in agreement with the plan.  He knows that he is welcome to call anytime in the interim with any questions or concerns related to his prior radiotherapy.  I personally spent 20 minutes in this encounter including chart review, reviewing radiological studies, telephone conversation with the patient, entering orders and completing documentation.    [name], MMS, PA-C [place] at [place]: [phone]  Fax: [phone]

## 2022-07-25 ENCOUNTER — Ambulatory Visit: Payer: Medicare PPO | Admitting: Urology

## 2022-07-26 DIAGNOSIS — I872 Venous insufficiency (chronic) (peripheral): Secondary | ICD-10-CM | POA: Diagnosis not present

## 2022-07-26 DIAGNOSIS — Z79899 Other long term (current) drug therapy: Secondary | ICD-10-CM | POA: Diagnosis not present

## 2022-07-26 DIAGNOSIS — R7303 Prediabetes: Secondary | ICD-10-CM | POA: Diagnosis not present

## 2022-08-07 DIAGNOSIS — Z85828 Personal history of other malignant neoplasm of skin: Secondary | ICD-10-CM | POA: Diagnosis not present

## 2022-08-07 DIAGNOSIS — L821 Other seborrheic keratosis: Secondary | ICD-10-CM | POA: Diagnosis not present

## 2022-08-07 DIAGNOSIS — L565 Disseminated superficial actinic porokeratosis (DSAP): Secondary | ICD-10-CM | POA: Diagnosis not present

## 2022-08-07 DIAGNOSIS — C44519 Basal cell carcinoma of skin of other part of trunk: Secondary | ICD-10-CM | POA: Diagnosis not present

## 2022-08-07 DIAGNOSIS — D2372 Other benign neoplasm of skin of left lower limb, including hip: Secondary | ICD-10-CM | POA: Diagnosis not present

## 2022-08-07 DIAGNOSIS — D485 Neoplasm of uncertain behavior of skin: Secondary | ICD-10-CM | POA: Diagnosis not present

## 2022-08-07 DIAGNOSIS — Z85820 Personal history of malignant melanoma of skin: Secondary | ICD-10-CM | POA: Diagnosis not present

## 2022-08-09 DIAGNOSIS — Z79899 Other long term (current) drug therapy: Secondary | ICD-10-CM | POA: Diagnosis not present

## 2022-08-09 DIAGNOSIS — R7303 Prediabetes: Secondary | ICD-10-CM | POA: Diagnosis not present

## 2022-08-09 DIAGNOSIS — M25522 Pain in left elbow: Secondary | ICD-10-CM | POA: Diagnosis not present

## 2022-08-09 DIAGNOSIS — Z9181 History of falling: Secondary | ICD-10-CM | POA: Diagnosis not present

## 2022-08-09 DIAGNOSIS — M818 Other osteoporosis without current pathological fracture: Secondary | ICD-10-CM | POA: Diagnosis not present

## 2022-08-09 DIAGNOSIS — Z8673 Personal history of transient ischemic attack (TIA), and cerebral infarction without residual deficits: Secondary | ICD-10-CM | POA: Diagnosis not present

## 2022-08-09 DIAGNOSIS — F331 Major depressive disorder, recurrent, moderate: Secondary | ICD-10-CM | POA: Diagnosis not present

## 2022-08-16 DIAGNOSIS — H353122 Nonexudative age-related macular degeneration, left eye, intermediate dry stage: Secondary | ICD-10-CM | POA: Diagnosis not present

## 2022-08-16 DIAGNOSIS — H40052 Ocular hypertension, left eye: Secondary | ICD-10-CM | POA: Diagnosis not present

## 2022-08-16 DIAGNOSIS — Z970 Presence of artificial eye: Secondary | ICD-10-CM | POA: Diagnosis not present

## 2022-08-16 DIAGNOSIS — H04122 Dry eye syndrome of left lacrimal gland: Secondary | ICD-10-CM | POA: Diagnosis not present

## 2022-08-16 DIAGNOSIS — H2512 Age-related nuclear cataract, left eye: Secondary | ICD-10-CM | POA: Diagnosis not present

## 2022-08-19 DIAGNOSIS — F4381 Prolonged grief disorder: Secondary | ICD-10-CM | POA: Diagnosis not present

## 2022-08-27 DIAGNOSIS — H903 Sensorineural hearing loss, bilateral: Secondary | ICD-10-CM | POA: Diagnosis not present

## 2022-09-04 DIAGNOSIS — Z85820 Personal history of malignant melanoma of skin: Secondary | ICD-10-CM | POA: Diagnosis not present

## 2022-09-04 DIAGNOSIS — D225 Melanocytic nevi of trunk: Secondary | ICD-10-CM | POA: Diagnosis not present

## 2022-09-04 DIAGNOSIS — Z85828 Personal history of other malignant neoplasm of skin: Secondary | ICD-10-CM | POA: Diagnosis not present

## 2022-09-04 DIAGNOSIS — N61 Mastitis without abscess: Secondary | ICD-10-CM | POA: Diagnosis not present

## 2022-09-05 ENCOUNTER — Other Ambulatory Visit: Payer: Self-pay | Admitting: Radiation Therapy

## 2022-09-05 DIAGNOSIS — C7250 Malignant neoplasm of unspecified cranial nerve: Secondary | ICD-10-CM

## 2022-09-09 DIAGNOSIS — S0571XD Avulsion of right eye, subsequent encounter: Secondary | ICD-10-CM | POA: Diagnosis not present

## 2022-09-11 ENCOUNTER — Telehealth: Payer: Self-pay | Admitting: Radiation Therapy

## 2022-09-11 NOTE — Telephone Encounter (Signed)
I spoke with [name] about his upcoming MRI and telephone follow-up with Ashlyn. He had already seen these in [place], but was thankful for the call to explain.   Mont Dutton R.T.(R)(T) Radiation Special Procedures Navigator

## 2022-09-13 DIAGNOSIS — C61 Malignant neoplasm of prostate: Secondary | ICD-10-CM | POA: Diagnosis not present

## 2022-09-27 DIAGNOSIS — F4381 Prolonged grief disorder: Secondary | ICD-10-CM | POA: Diagnosis not present

## 2022-11-05 DIAGNOSIS — F4381 Prolonged grief disorder: Secondary | ICD-10-CM | POA: Diagnosis not present

## 2022-11-27 DIAGNOSIS — F4381 Prolonged grief disorder: Secondary | ICD-10-CM | POA: Diagnosis not present

## 2022-12-05 DIAGNOSIS — R7303 Prediabetes: Secondary | ICD-10-CM | POA: Diagnosis not present

## 2022-12-05 DIAGNOSIS — Z79899 Other long term (current) drug therapy: Secondary | ICD-10-CM | POA: Diagnosis not present

## 2022-12-10 DIAGNOSIS — Z8673 Personal history of transient ischemic attack (TIA), and cerebral infarction without residual deficits: Secondary | ICD-10-CM | POA: Diagnosis not present

## 2022-12-10 DIAGNOSIS — Z0000 Encounter for general adult medical examination without abnormal findings: Secondary | ICD-10-CM | POA: Diagnosis not present

## 2022-12-10 DIAGNOSIS — Z6822 Body mass index (BMI) 22.0-22.9, adult: Secondary | ICD-10-CM | POA: Diagnosis not present

## 2022-12-10 DIAGNOSIS — Z79899 Other long term (current) drug therapy: Secondary | ICD-10-CM | POA: Diagnosis not present

## 2022-12-10 DIAGNOSIS — E559 Vitamin D deficiency, unspecified: Secondary | ICD-10-CM | POA: Diagnosis not present

## 2022-12-10 DIAGNOSIS — Z9181 History of falling: Secondary | ICD-10-CM | POA: Diagnosis not present

## 2022-12-10 DIAGNOSIS — F331 Major depressive disorder, recurrent, moderate: Secondary | ICD-10-CM | POA: Diagnosis not present

## 2022-12-10 DIAGNOSIS — M818 Other osteoporosis without current pathological fracture: Secondary | ICD-10-CM | POA: Diagnosis not present

## 2022-12-10 DIAGNOSIS — Z1389 Encounter for screening for other disorder: Secondary | ICD-10-CM | POA: Diagnosis not present

## 2022-12-10 DIAGNOSIS — Z8546 Personal history of malignant neoplasm of prostate: Secondary | ICD-10-CM | POA: Diagnosis not present

## 2022-12-10 DIAGNOSIS — Z6823 Body mass index (BMI) 23.0-23.9, adult: Secondary | ICD-10-CM | POA: Diagnosis not present

## 2022-12-10 DIAGNOSIS — R7303 Prediabetes: Secondary | ICD-10-CM | POA: Diagnosis not present

## 2022-12-11 DIAGNOSIS — C61 Malignant neoplasm of prostate: Secondary | ICD-10-CM | POA: Diagnosis not present

## 2022-12-18 DIAGNOSIS — C61 Malignant neoplasm of prostate: Secondary | ICD-10-CM | POA: Diagnosis not present

## 2022-12-18 DIAGNOSIS — N3946 Mixed incontinence: Secondary | ICD-10-CM | POA: Diagnosis not present

## 2022-12-20 DIAGNOSIS — I951 Orthostatic hypotension: Secondary | ICD-10-CM | POA: Diagnosis not present

## 2022-12-20 DIAGNOSIS — Z6823 Body mass index (BMI) 23.0-23.9, adult: Secondary | ICD-10-CM | POA: Diagnosis not present

## 2022-12-23 DIAGNOSIS — R42 Dizziness and giddiness: Secondary | ICD-10-CM | POA: Diagnosis not present

## 2022-12-23 DIAGNOSIS — Z6823 Body mass index (BMI) 23.0-23.9, adult: Secondary | ICD-10-CM | POA: Diagnosis not present

## 2022-12-31 DIAGNOSIS — H2512 Age-related nuclear cataract, left eye: Secondary | ICD-10-CM | POA: Diagnosis not present

## 2022-12-31 DIAGNOSIS — H04122 Dry eye syndrome of left lacrimal gland: Secondary | ICD-10-CM | POA: Diagnosis not present

## 2022-12-31 DIAGNOSIS — H40052 Ocular hypertension, left eye: Secondary | ICD-10-CM | POA: Diagnosis not present

## 2022-12-31 DIAGNOSIS — H353122 Nonexudative age-related macular degeneration, left eye, intermediate dry stage: Secondary | ICD-10-CM | POA: Diagnosis not present

## 2023-01-08 ENCOUNTER — Ambulatory Visit
Admission: RE | Admit: 2023-01-08 | Discharge: 2023-01-08 | Disposition: A | Discharge status: Home / Self Care | Payer: Medicare PPO | Source: Ambulatory Visit | Attending: Radiation Oncology | Admitting: Radiation Oncology

## 2023-01-08 DIAGNOSIS — Z85810 Personal history of malignant neoplasm of tongue: Secondary | ICD-10-CM | POA: Diagnosis not present

## 2023-01-08 DIAGNOSIS — C61 Malignant neoplasm of prostate: Secondary | ICD-10-CM | POA: Diagnosis not present

## 2023-01-08 DIAGNOSIS — M810 Age-related osteoporosis without current pathological fracture: Secondary | ICD-10-CM | POA: Diagnosis not present

## 2023-01-08 DIAGNOSIS — Z8546 Personal history of malignant neoplasm of prostate: Secondary | ICD-10-CM | POA: Diagnosis not present

## 2023-01-08 DIAGNOSIS — Z8781 Personal history of (healed) traumatic fracture: Secondary | ICD-10-CM | POA: Diagnosis not present

## 2023-01-08 DIAGNOSIS — C7250 Malignant neoplasm of unspecified cranial nerve: Secondary | ICD-10-CM

## 2023-01-08 DIAGNOSIS — Z8262 Family history of osteoporosis: Secondary | ICD-10-CM | POA: Diagnosis not present

## 2023-01-08 DIAGNOSIS — C44529 Squamous cell carcinoma of skin of other part of trunk: Secondary | ICD-10-CM | POA: Diagnosis not present

## 2023-01-08 MED ORDER — GADOPICLENOL 0.5 MMOL/ML IV SOLN
6.0000 mL | Freq: Once | INTRAVENOUS | Status: AC | PRN
  Administered 2023-01-08: 6 mL via INTRAVENOUS

## 2023-01-15 ENCOUNTER — Encounter: Payer: Self-pay | Admitting: Urology

## 2023-01-15 ENCOUNTER — Ambulatory Visit
Admission: RE | Admit: 2023-01-15 | Discharge: 2023-01-15 | Disposition: A | Discharge status: Home / Self Care | Payer: Medicare PPO | Source: Ambulatory Visit | Attending: Urology | Admitting: Urology

## 2023-01-15 DIAGNOSIS — C44320 Squamous cell carcinoma of skin of unspecified parts of face: Secondary | ICD-10-CM | POA: Diagnosis not present

## 2023-01-15 DIAGNOSIS — C7250 Malignant neoplasm of unspecified cranial nerve: Secondary | ICD-10-CM

## 2023-01-15 NOTE — Progress Notes (Signed)
Telephone nursing appointment for patient to review most recent scan results from 01/08/2023. I verified patient's identity x2 and began nursing interview. Patient reports occasional numbness RT cheek and lip. No other issues conveyed at this time.   Meaningful use complete.   Patient aware of their 11:00am-01/15/2023 telephone appointment w/ Ashlyn Bruning PA-C. I left my extension [phone] in case patient needs anything. Patient verbalized understanding. This concludes the nursing interview.   Patient contact [phone]     Ruel Favors, LPN

## 2023-01-15 NOTE — Progress Notes (Signed)
Radiation Oncology         [phone]) [phone] ________________________________  Name: Paul Vasquez. MRN: [id number]  Date: 01/15/2023  DOB: Apr 17, 1946  Post-treatment Follow-Up Note  CC: [facility], Gloriajean Dell, MD  Diagnosis:   78 y.o. 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    ICD-10-CM   1. 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  05/18/2019 - 06/28/2019: Right [place] / 60 Gy in 30 fractions  Narrative:  I spoke with the patient to conduct his routine scheduled 6 month follow up visit to review results of his recent MRI face/trigeminal nerve scan via telephone to spare the patient unnecessary potential exposure in the healthcare setting during the current COVID-19 pandemic.  The patient was notified in advance and gave permission to proceed with this visit format.  He tolerated radiation treatment relatively well but continues with dense numbness in the V2 distribution. On 07/09/21, he called with concerns of an increase in pressure and numbness felt in his face on the right and towards his lip so he had a short interval follow up scan for evaluation, in light of his prior disease recurrence.  Subsequent follow-up scans have confirmed no progressive disease at the right V2 segment of the trigeminal nerve and no evidence of intracranial involvement.  On his MRI brain scan from 10/19/21, there was mention of chronically advanced small vessel disease, and numerous chronic microhemorrhages throughout the brain, raising the possibility of underlying Amyloid Angiopathy.  His imaging was reviewed in the multidisciplinary brain tumor board and the micro-hemorrhages were not felt to be concerning as they are usually asymptomatic and these patients do have greater tendency to hemorrhage spontaneously or with anticoagulation. He is currently on ASA 81 mg daily only for history of CVA in 12/2019 so we shared the results with his neurologist, Dr. Pearlean Brownie, to keep him in the loop regarding any additional recommendations. His most recent MRI face/trigeminal scan from 01/08/23 shows a very stable and satisfactory posttreatment appearance of the trigeminal nerves, right orbit and lateral facial soft tissues with no evidence of disease recurrence or metastasis.  We reviewed these results today.                              On review of systems, the patient states that he is doing well in general.  He continues with the right sided facial numbness but this has not changed significantly.  He reports that the symptoms remain variable, with some days better than others, occasionally with more pronounced numbness on the right side of his face and lip and less symptoms in the right temple area.  He denies any headaches, nausea, vomiting, dizziness or imbalance.  Overall, he remains pleased with his progress to date and currently without complaints.  ALLERGIES:  is allergic to amoxicillin, sulfamethoxazole, and sulfamethoxazole-trimethoprim.  Meds: Current Outpatient Medications  Medication Sig Dispense Refill   aspirin EC 81 MG tablet Take 81 mg by mouth daily. Swallow whole.  atorvastatin (LIPITOR) 40 MG tablet Take 1 tablet (40 mg total) by mouth daily. 90 tablet 0   calcium-vitamin D (OSCAL WITH D) 500-200 MG-UNIT tablet Take 1 tablet by mouth.      escitalopram (LEXAPRO) 10 MG tablet Take 10 mg by mouth daily.     melatonin 1 MG TABS tablet Take 2 mg by mouth at bedtime.     NON FORMULARY Vitamin Code Grow Bone Calcium     NON FORMULARY Nature Sunshine Immune Stimulator daily      NON FORMULARY Nature Sunshine Skeletal strength daily     NON FORMULARY Growth factor daily     NON FORMULARY Wheat grass daily     NON FORMULARY Q-absorb Co-Q10     NON FORMULARY Apex Energetics Adaptocrine     NON FORMULARY Apex Energetics Gabatone     NON FORMULARY Apex Energetics AD-Pro (bitamin A and D)     NON FORMULARY [employer] Prolent      Omega-3 Fatty Acids (OMEGA-3 EPA FISH OIL PO) Take by mouth.     Probiotic Product (SOLUBLE FIBER/PROBIOTICS PO) Take by mouth.     vitamin C (ASCORBIC ACID) 500 MG tablet Take 500 mg by mouth 2 (two) times daily.      No current facility-administered medications for this encounter.    Physical Findings: Unable to assess due to telephone follow-up visit format.  Lab Findings: Lab Results  Component Value Date   WBC 4.0 12/31/2019   HGB 13.4 12/31/2019   HCT 40.1 12/31/2019   PLT 210 12/31/2019    Lab Results  Component Value Date   NA 140 12/31/2019   K 3.7 12/31/2019   CO2 24 12/31/2019   GLUCOSE 97 12/31/2019   BUN 20 11/30/2020   CREATININE 0.77 11/30/2020   BILITOT 0.9 12/30/2019   ALKPHOS 56 12/30/2019   AST 14 (L) 12/30/2019   ALT 13 12/30/2019   PROT 5.5 (L) 12/30/2019   ALBUMIN 3.4 (L) 12/30/2019   CALCIUM 8.6 (L) 12/31/2019   ANIONGAP 8 12/31/2019    Radiographic Findings: MR FACE/TRIGEMINAL WO/W CM  Result Date: 01/13/2023 CLINICAL DATA:  77 year old male with a history of right temporal cutaneous squamous cell carcinoma. Prostate cancer. 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 Stable and/or regressed MRI abnormality next since that time. Restaging. EXAM: MRI FACE TRIGEMINAL WITHOUT AND WITH CONTRAST TECHNIQUE: Multiplanar, multi-echo pulse sequences of the face and surrounding structures, including thin-slice imaging of the trigeminal nerves, were acquired before and after intravenous contrast administration. CONTRAST:  6 mL Vueway COMPARISON:   Face/trigeminal MRI 07/10/2022 and earlier. FINDINGS: Limited intracranial/Trigeminal nerves: Cisternal 5th nerve segments appear stable, relatively symmetric in size. No cisternal 5th nerve enhancement now. Minimal postcontrast asymmetry of the right Meckel's cave (series 11, image 18 now) is significantly improved compared to 11/24/2020, and stable since last year. Left Meckel's cave remains normal. Anterior to that, cavernous sinuses appears stable. No convincing right V2 segment enhancement now on series 11, image 25. And V3 trunks also appear symmetric on series 12, image 12. Infraorbital nerves and bilateral inferior alveolar nerves appear symmetric and within normal limits. Chronic STIR signal heterogeneity in the brainstem appears stable since 2022. Stable visible gray and white matter signal since that time. No intracranial mass effect or ventriculomegaly. No abnormal intracranial enhancement or dural thickening identified. Vascular: Major intracranial vascular flow voids remain stable. Sinuses/Orbits: Chronic postoperative changes in the right orbit with right globe  prosthesis appears stable since 2022. No intraorbital mass or abnormal enhancement is identified. Postoperative changes to the paranasal sinuses most pronounced at the left maxillary antrum. Scattered mild sinus mucosal thickening and retention cysts have not significantly changed since 2022. Soft tissues: Stable, negative visible deep soft tissue spaces of the face and neck. No upper cervical lymphadenopathy. Osseous: Chronic upper cervical spine degeneration. Visible bone marrow signal is stable, within normal limits aside from decreased right calvarium T1 marrow signal overlying the posterior temporal lobe, stable since 2022. Other: Visible internal auditory structures appear normal. Mastoids remain clear. Right scalp soft tissue deficiency (series 10, image 28) compatible with primary resection site remain stable since 2022. IMPRESSION: 1.  Stable and satisfactory post treatment MRI appearance of the trigeminal nerves. 2. Stable postoperative changes to the right orbit and lateral face soft tissues. 3. No new metastatic disease identified. Electronically Signed   By: Odessa Fleming M.D.   On: 01/13/2023 11:31     Impression/Plan: This visit was conducted via telephone to spare the patient unnecessary potential exposure in the healthcare setting during the current COVID-19 pandemic.  77 y.o. 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 He has recovered well from the effects of his recent [facility] and remains without new complaints.  His recent MRI trigeminal nerve study confirms a stable appearance with no progressive disease at the right V2 segment of the trigeminal nerve and no evidence of intracranial involvement.  We discussed the plan to continue with 6 month surveillance scans going forward and will connect by telephone to review results and recommendations from multidisciplinary tumor board following each scan. He appears to have a good understanding of our recommendations and is comfortable and in agreement with the plan.  He knows that he is welcome to call anytime in the interim with any questions or concerns related to his prior radiotherapy.  I personally spent 20 minutes in this encounter including chart review, reviewing radiological studies, telephone conversation with the patient, entering orders and completing documentation.    Marguarite Arbour, MMS, PA-C   [facility] Radiation Oncology Physician Assistant Direct Dial: [phone]  Fax: [phone]

## 2023-01-27 DIAGNOSIS — L570 Actinic keratosis: Secondary | ICD-10-CM | POA: Diagnosis not present

## 2023-01-27 DIAGNOSIS — D2271 Melanocytic nevi of right lower limb, including hip: Secondary | ICD-10-CM | POA: Diagnosis not present

## 2023-01-27 DIAGNOSIS — D2272 Melanocytic nevi of left lower limb, including hip: Secondary | ICD-10-CM | POA: Diagnosis not present

## 2023-01-27 DIAGNOSIS — Z85820 Personal history of malignant melanoma of skin: Secondary | ICD-10-CM | POA: Diagnosis not present

## 2023-01-27 DIAGNOSIS — Z85828 Personal history of other malignant neoplasm of skin: Secondary | ICD-10-CM | POA: Diagnosis not present

## 2023-01-27 DIAGNOSIS — L820 Inflamed seborrheic keratosis: Secondary | ICD-10-CM | POA: Diagnosis not present

## 2023-01-27 DIAGNOSIS — D225 Melanocytic nevi of trunk: Secondary | ICD-10-CM | POA: Diagnosis not present

## 2023-01-27 DIAGNOSIS — L821 Other seborrheic keratosis: Secondary | ICD-10-CM | POA: Diagnosis not present

## 2023-03-10 DIAGNOSIS — L905 Scar conditions and fibrosis of skin: Secondary | ICD-10-CM | POA: Diagnosis not present

## 2023-03-10 DIAGNOSIS — L570 Actinic keratosis: Secondary | ICD-10-CM | POA: Diagnosis not present

## 2023-03-10 DIAGNOSIS — Z85828 Personal history of other malignant neoplasm of skin: Secondary | ICD-10-CM | POA: Diagnosis not present

## 2023-03-10 DIAGNOSIS — Z85820 Personal history of malignant melanoma of skin: Secondary | ICD-10-CM | POA: Diagnosis not present

## 2023-03-12 ENCOUNTER — Other Ambulatory Visit: Payer: Self-pay | Admitting: Radiation Therapy

## 2023-03-12 DIAGNOSIS — C7250 Malignant neoplasm of unspecified cranial nerve: Secondary | ICD-10-CM

## 2023-03-13 DIAGNOSIS — E559 Vitamin D deficiency, unspecified: Secondary | ICD-10-CM | POA: Diagnosis not present

## 2023-03-13 DIAGNOSIS — Z8673 Personal history of transient ischemic attack (TIA), and cerebral infarction without residual deficits: Secondary | ICD-10-CM | POA: Diagnosis not present

## 2023-03-13 DIAGNOSIS — R7303 Prediabetes: Secondary | ICD-10-CM | POA: Diagnosis not present

## 2023-03-27 DIAGNOSIS — C61 Malignant neoplasm of prostate: Secondary | ICD-10-CM | POA: Diagnosis not present

## 2023-03-28 DIAGNOSIS — E559 Vitamin D deficiency, unspecified: Secondary | ICD-10-CM | POA: Diagnosis not present

## 2023-03-28 DIAGNOSIS — F331 Major depressive disorder, recurrent, moderate: Secondary | ICD-10-CM | POA: Diagnosis not present

## 2023-03-28 DIAGNOSIS — R7303 Prediabetes: Secondary | ICD-10-CM | POA: Diagnosis not present

## 2023-03-28 DIAGNOSIS — Z6822 Body mass index (BMI) 22.0-22.9, adult: Secondary | ICD-10-CM | POA: Diagnosis not present

## 2023-03-28 DIAGNOSIS — Z79899 Other long term (current) drug therapy: Secondary | ICD-10-CM | POA: Diagnosis not present

## 2023-03-28 DIAGNOSIS — Z8673 Personal history of transient ischemic attack (TIA), and cerebral infarction without residual deficits: Secondary | ICD-10-CM | POA: Diagnosis not present

## 2023-04-24 ENCOUNTER — Encounter (INDEPENDENT_AMBULATORY_CARE_PROVIDER_SITE_OTHER): Payer: Medicare PPO | Admitting: Ophthalmology

## 2023-04-24 DIAGNOSIS — H2512 Age-related nuclear cataract, left eye: Secondary | ICD-10-CM | POA: Diagnosis not present

## 2023-04-24 DIAGNOSIS — H353122 Nonexudative age-related macular degeneration, left eye, intermediate dry stage: Secondary | ICD-10-CM | POA: Diagnosis not present

## 2023-04-24 DIAGNOSIS — H40052 Ocular hypertension, left eye: Secondary | ICD-10-CM | POA: Diagnosis not present

## 2023-05-01 ENCOUNTER — Telehealth: Payer: Self-pay | Admitting: Radiation Therapy

## 2023-05-01 NOTE — Telephone Encounter (Signed)
Left a detailed message for patient about upcoming brain MRI and telephone follow-up with Ellie in December. My contact information was included for him to call back with questions or concerns.   Jalene Mullet R.T.(R)(T) Radiation Special Procedures Navigator

## 2023-06-30 DIAGNOSIS — H40052 Ocular hypertension, left eye: Secondary | ICD-10-CM | POA: Diagnosis not present

## 2023-06-30 DIAGNOSIS — H2512 Age-related nuclear cataract, left eye: Secondary | ICD-10-CM | POA: Diagnosis not present

## 2023-06-30 DIAGNOSIS — H353122 Nonexudative age-related macular degeneration, left eye, intermediate dry stage: Secondary | ICD-10-CM | POA: Diagnosis not present

## 2023-06-30 DIAGNOSIS — H04122 Dry eye syndrome of left lacrimal gland: Secondary | ICD-10-CM | POA: Diagnosis not present

## 2023-06-30 DIAGNOSIS — Z970 Presence of artificial eye: Secondary | ICD-10-CM | POA: Diagnosis not present

## 2023-07-14 ENCOUNTER — Ambulatory Visit
Admission: RE | Admit: 2023-07-14 | Discharge: 2023-07-14 | Disposition: A | Discharge status: Home / Self Care | Payer: Medicare PPO | Source: Ambulatory Visit | Attending: Radiation Oncology | Admitting: Radiation Oncology

## 2023-07-14 DIAGNOSIS — C7250 Malignant neoplasm of unspecified cranial nerve: Secondary | ICD-10-CM

## 2023-07-14 DIAGNOSIS — C801 Malignant (primary) neoplasm, unspecified: Secondary | ICD-10-CM | POA: Diagnosis not present

## 2023-07-14 DIAGNOSIS — C7931 Secondary malignant neoplasm of brain: Secondary | ICD-10-CM | POA: Diagnosis not present

## 2023-07-14 MED ORDER — GADOPICLENOL 0.5 MMOL/ML IV SOLN
7.0000 mL | Freq: Once | INTRAVENOUS | Status: AC | PRN
  Administered 2023-07-14: 7 mL via INTRAVENOUS

## 2023-07-18 DIAGNOSIS — C61 Malignant neoplasm of prostate: Secondary | ICD-10-CM | POA: Diagnosis not present

## 2023-07-21 ENCOUNTER — Ambulatory Visit: Payer: Medicare PPO | Admitting: Radiology

## 2023-07-21 ENCOUNTER — Inpatient Hospital Stay: Payer: Medicare PPO | Attending: Radiation Oncology

## 2023-07-23 ENCOUNTER — Ambulatory Visit
Admission: RE | Admit: 2023-07-23 | Discharge: 2023-07-23 | Disposition: A | Discharge status: Home / Self Care | Payer: Medicare PPO | Source: Ambulatory Visit | Attending: Urology | Admitting: Urology

## 2023-07-23 ENCOUNTER — Ambulatory Visit: Payer: Self-pay | Admitting: Urology

## 2023-07-23 ENCOUNTER — Encounter: Payer: Self-pay | Admitting: Urology

## 2023-07-23 DIAGNOSIS — C7250 Malignant neoplasm of unspecified cranial nerve: Secondary | ICD-10-CM

## 2023-07-23 DIAGNOSIS — C44320 Squamous cell carcinoma of skin of unspecified parts of face: Secondary | ICD-10-CM

## 2023-07-23 NOTE — Progress Notes (Signed)
Telephone nursing appointment for review of . I verified patient's identity x2 and began nursing interview.   Patient reports a reduction of touch sensation to the face overall, w/ an 'elevation' of sensation concentration in the RT-side of the upper lip. Patient denies any other related issues at this time.   Meaningful use complete.   Patient aware of their 11:30am-07/23/2023 telephone appointment w/ Ashlyn Bruning PA-C. I left my extension [phone] in case patient needs anything. Patient verbalized understanding. This concludes the nursing interview.   Patient contact [phone]     Ruel Favors, LPN

## 2023-07-23 NOTE — Progress Notes (Signed)
Radiation Oncology         [phone]) [phone] ________________________________  Name: Paul Vasquez. MRN: [id number]  Date: 07/23/2023  DOB: 1946/04/14  Post-treatment Follow-Up Note  CC: [facility] @ [address] [place] [facility]  Diagnosis:   77 y.o. 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    ICD-10-CM   1. 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 Squamous cell carcinoma of face  C44.320       Interval Since Last Radiation:  2.5 years 4/29, 5/2, 5/4, 5/6 and 12/18/2020//SRS: The right 10 mm Meckel's cave trigeminal nerve target was treated to a prescription dose of 5 Gy which was repeated for a total of 5 fractions to a total dose of 25 Gy.  05/18/2019 - 06/28/2019: Right Temple / 60 Gy in 30 fractions  Narrative:  I spoke with the patient to conduct his routine scheduled 6 month follow up visit to review results of his recent MRI face/trigeminal nerve scan via telephone to spare the patient unnecessary potential exposure in the healthcare setting during the current COVID-19 pandemic.  The patient was notified in advance and gave permission to proceed with this visit format.  He tolerated radiation treatment relatively well but continues with dense numbness in the V2 distribution. On 07/09/21, he called with concerns of an increase in pressure and numbness felt in his face on the right and towards his lip so he had a short interval follow up scan for evaluation, in light of his prior disease recurrence.  Subsequent follow-up scans have confirmed no progressive disease at the right V2 segment of the trigeminal nerve and no evidence of intracranial involvement.  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 His imaging was reviewed in the multidisciplinary brain tumor board and the micro-hemorrhages were not felt to be concerning as they are usually asymptomatic and these patients do have greater tendency to hemorrhage spontaneously or with anticoagulation. He is currently on ASA 81 mg daily only for history of CVA in 12/2019 so we shared the results with his neurologist, Dr. Pearlean Brownie, to keep him in the loop regarding any additional recommendations. His most recent MRI face/trigeminal scan from 07/14/23 shows a very stable posttreatment appearance along the right Meckle's cave with no evidence of disease recurrence or metastasis.  There was some new, nonspecific contrast enhancement along the right aspect of the oral cavity that was felt most likely inflammatory, but the consensus recommendation in multidisciplinary tumor board was for correlation with physical exam. We reviewed these results today and he has a scheduled follow up with his dermatologist, Dr. Yetta Barre, in the next week.                              On review of systems, the patient states that he is doing well in general.  He continues with the right sided facial numbness but this has not changed significantly.  He reports that the symptoms remain variable, with some days better than others, occasionally with more pronounced numbness on the right side of his face and lip and less sensation in the right temple area. He did switch toothpaste recently and noted  some increased irritation along the upper and lower gumline, on the right specifically, with brushing. He has since discontinued the toothpaste and has noted some gradual improvement. He denies any headaches, nausea, vomiting, dizziness or imbalance.  Overall, he remains pleased with his progress to date and currently without complaints.  ALLERGIES:  is allergic to amoxicillin, sulfamethoxazole, and  sulfamethoxazole-trimethoprim.  Meds: Current Outpatient Medications  Medication Sig Dispense Refill   aspirin EC 81 MG tablet Take 81 mg by mouth daily. Swallow whole.     atorvastatin (LIPITOR) 40 MG tablet Take 1 tablet (40 mg total) by mouth daily. 90 tablet 0   calcium-vitamin D (OSCAL WITH D) 500-200 MG-UNIT tablet Take 1 tablet by mouth.      escitalopram (LEXAPRO) 10 MG tablet Take 10 mg by mouth daily.     melatonin 1 MG TABS tablet Take 2 mg by mouth at bedtime.     NON FORMULARY Vitamin Code Grow Bone Calcium     NON FORMULARY Nature Sunshine Immune Stimulator daily     NON FORMULARY Nature Sunshine Skeletal strength daily     NON FORMULARY Growth factor daily     NON FORMULARY Wheat grass daily     NON FORMULARY Q-absorb Co-Q10     NON FORMULARY Apex Energetics Adaptocrine     NON FORMULARY Apex Energetics Gabatone     NON FORMULARY Apex Energetics AD-Pro (bitamin A and D)     NON FORMULARY [place] Prolent      Omega-3 Fatty Acids (OMEGA-3 EPA FISH OIL PO) Take by mouth.     Probiotic Product (SOLUBLE FIBER/PROBIOTICS PO) Take by mouth.     vitamin C (ASCORBIC ACID) 500 MG tablet Take 500 mg by mouth 2 (two) times daily.      No current facility-administered medications for this encounter.    Physical Findings: Unable to assess due to telephone follow-up visit format.  Lab Findings: Lab Results  Component Value Date   WBC 4.0 12/31/2019   HGB 13.4 12/31/2019   HCT 40.1 12/31/2019   PLT 210 12/31/2019    Lab Results  Component Value Date   NA 140 12/31/2019   K 3.7 12/31/2019   CO2 24 12/31/2019   GLUCOSE 97 12/31/2019   BUN 20 11/30/2020   CREATININE 0.77 11/30/2020   BILITOT 0.9 12/30/2019   ALKPHOS 56 12/30/2019   AST 14 (L) 12/30/2019   ALT 13 12/30/2019   PROT 5.5 (L) 12/30/2019   ALBUMIN 3.4 (L) 12/30/2019   CALCIUM 8.6 (L) 12/31/2019   ANIONGAP 8 12/31/2019    Radiographic Findings: MR FACE/TRIGEMINAL WO/W CM  Result Date:  07/17/2023 CLINICAL DATA:  Brain metastases, assess treatment response 3T SRS Trigem protocol. Follow-up of treated metastataic disease involing the cranial nerves EXAM: MRI FACE TRIGEMINAL WITHOUT AND WITH CONTRAST TECHNIQUE: Multiplanar, multi-echo pulse sequences of the face and surrounding structures, including thin-slice imaging of the trigeminal nerves, were acquired before and after intravenous contrast administration. CONTRAST:  6 ml Vueway COMPARISON:  MRI Face 01/08/23, 07/10/22 FINDINGS: Limited intracranial/Trigeminal nerves: Unchanged appearance of trace contrast enhancement along the right Meckel's cave (series 13, image 17). The left Meckel's cave is normal in appearance. The cisternal portion of the right trigeminal nerve is normal in appearance. Normal appearance of the V1 V3 segments of the trigeminal nerve on the right. Vascular: Normal flow voids. 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 Unchanged appearance of the right orbit compared to prior exam. No middle ear or mastoid effusion. Paranasal  sinuses are notable for mucosal thickening in the bilateral maxillary sinuses. Left maxillary antrostomy. Soft tissues: Normal. Osseous: Normal marrow signal without focal lesion. Other: New nonspecific contrast enhancement along the right aspect of the oral cavity (series 13, image 21). Recommend correlation with physical exam. IMPRESSION: 1. Unchanged appearance of trace contrast enhancement along the right Meckel's cave. No finding to suggest new or worsening perineural spread of tumor. 2. New nonspecific contrast enhancement along the right aspect of the oral cavity. Recommend correlation with physical exam. Electronically Signed   By: Lorenza Cambridge M.D.   On: 07/17/2023 11:45     Impression/Plan: This visit was conducted via telephone to spare the patient unnecessary potential exposure in the healthcare setting during the current COVID-19 pandemic.  77 y.o. gentleman  with Stage T2b invasive, cutaneous Squamous Cell Carcinoma of the Right [place] now with Trigeminal Nerve Involvement up to [facility]. He has recovered well from the effects of his recent [facility] and remains without new complaints.  His recent MRI trigeminal nerve study confirms a stable appearance with no progressive disease at the right V2 segment of the trigeminal nerve and no evidence of intracranial involvement.  There was some new, nonspecific contrast enhancement along the right aspect of the oral cavity that was felt most likely inflammatory, but the consensus recommendation in multidisciplinary tumor board was for correlation with physical exam. We reviewed these results today and he has a scheduled follow up with his dermatologist, Dr. Yetta Barre, in the next week so I will forward a copy of today's visit and recent MRI report so that he is aware. Pending there are no concerns at his upcoming dermatology visit, we discussed the plan to continue with 6 month surveillance scans going forward and will connect by telephone to review results and recommendations from multidisciplinary tumor board following each scan. He appears to have a good understanding of our recommendations and is comfortable and in agreement with the plan.  He knows that he is welcome to call anytime in the interim with any questions or concerns related to his prior radiotherapy.  I personally spent 30 minutes in this encounter including chart review, reviewing radiological studies, telephone conversation with the patient, entering orders and completing documentation.    Marguarite Arbour, MMS, PA-C [place]  [facility] Radiation Oncology Physician Assistant Direct Dial: [phone]  Fax: [phone]

## 2023-07-25 ENCOUNTER — Other Ambulatory Visit: Payer: Self-pay

## 2023-07-25 DIAGNOSIS — C7931 Secondary malignant neoplasm of brain: Secondary | ICD-10-CM

## 2023-07-25 DIAGNOSIS — N3946 Mixed incontinence: Secondary | ICD-10-CM | POA: Diagnosis not present

## 2023-07-25 DIAGNOSIS — C61 Malignant neoplasm of prostate: Secondary | ICD-10-CM | POA: Diagnosis not present

## 2023-07-25 DIAGNOSIS — C7250 Malignant neoplasm of unspecified cranial nerve: Secondary | ICD-10-CM

## 2023-07-31 DIAGNOSIS — Z85820 Personal history of malignant melanoma of skin: Secondary | ICD-10-CM | POA: Diagnosis not present

## 2023-07-31 DIAGNOSIS — D225 Melanocytic nevi of trunk: Secondary | ICD-10-CM | POA: Diagnosis not present

## 2023-07-31 DIAGNOSIS — L570 Actinic keratosis: Secondary | ICD-10-CM | POA: Diagnosis not present

## 2023-07-31 DIAGNOSIS — Z85828 Personal history of other malignant neoplasm of skin: Secondary | ICD-10-CM | POA: Diagnosis not present

## 2023-07-31 DIAGNOSIS — L905 Scar conditions and fibrosis of skin: Secondary | ICD-10-CM | POA: Diagnosis not present

## 2023-07-31 DIAGNOSIS — D2262 Melanocytic nevi of left upper limb, including shoulder: Secondary | ICD-10-CM | POA: Diagnosis not present

## 2023-07-31 DIAGNOSIS — L821 Other seborrheic keratosis: Secondary | ICD-10-CM | POA: Diagnosis not present

## 2023-07-31 DIAGNOSIS — L814 Other melanin hyperpigmentation: Secondary | ICD-10-CM | POA: Diagnosis not present

## 2023-08-01 ENCOUNTER — Other Ambulatory Visit (HOSPITAL_COMMUNITY): Payer: Self-pay | Admitting: Urology

## 2023-08-01 DIAGNOSIS — C61 Malignant neoplasm of prostate: Secondary | ICD-10-CM

## 2023-08-18 ENCOUNTER — Encounter (HOSPITAL_COMMUNITY)
Admission: RE | Admit: 2023-08-18 | Discharge: 2023-08-18 | Disposition: A | Discharge status: Home / Self Care | Payer: Medicare PPO | Source: Ambulatory Visit | Attending: Urology | Admitting: Urology

## 2023-08-18 DIAGNOSIS — C61 Malignant neoplasm of prostate: Secondary | ICD-10-CM | POA: Insufficient documentation

## 2023-08-18 MED ORDER — FLOTUFOLASTAT F 18 GALLIUM 296-5846 MBQ/ML IV SOLN
8.1590 | Freq: Once | INTRAVENOUS | Status: AC
  Administered 2023-08-18: 8.159 via INTRAVENOUS

## 2023-09-04 ENCOUNTER — Inpatient Hospital Stay: Payer: Medicare PPO | Attending: Radiation Oncology | Admitting: Internal Medicine

## 2023-09-04 ENCOUNTER — Ambulatory Visit
Admission: RE | Admit: 2023-09-04 | Discharge: 2023-09-04 | Disposition: A | Discharge status: Home / Self Care | Payer: Medicare PPO | Source: Ambulatory Visit | Attending: Radiation Oncology | Admitting: Radiation Oncology

## 2023-09-04 VITALS — BP 134/74 | HR 64 | Temp 97.9°F | Resp 17 | Ht 68.0 in | Wt 145.0 lb

## 2023-09-04 DIAGNOSIS — C61 Malignant neoplasm of prostate: Secondary | ICD-10-CM

## 2023-09-04 DIAGNOSIS — Z79899 Other long term (current) drug therapy: Secondary | ICD-10-CM | POA: Diagnosis not present

## 2023-09-04 DIAGNOSIS — Z8546 Personal history of malignant neoplasm of prostate: Secondary | ICD-10-CM | POA: Diagnosis not present

## 2023-09-04 DIAGNOSIS — C7250 Malignant neoplasm of unspecified cranial nerve: Secondary | ICD-10-CM

## 2023-09-04 DIAGNOSIS — C44320 Squamous cell carcinoma of skin of unspecified parts of face: Secondary | ICD-10-CM | POA: Diagnosis not present

## 2023-09-04 DIAGNOSIS — Z85828 Personal history of other malignant neoplasm of skin: Secondary | ICD-10-CM | POA: Diagnosis not present

## 2023-09-04 NOTE — Progress Notes (Signed)
Radiation Oncology         [phone]) [phone] ________________________________  Name: Paul Vasquez. MRN: [id number]  Date: 09/04/2023  DOB: [birth year]  Follow-Up Note  CC: [facility] @ [address], [place] [facility]*  Diagnosis:   78 y.o. 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    ICD-10-CM   1. Squamous cell carcinoma of face  C44.320     2. Malignant neoplasm of prostate (HCC)  C61       Interval Since Last Radiation:  2.5 years 4/29, 5/2, 5/4, 5/6 and 12/18/2020//SRS: The right 10 mm Meckel's cave trigeminal nerve target was treated to a prescription dose of 5 Gy which was repeated for a total of 5 fractions to a total dose of 25 Gy.  05/18/2019 - 06/28/2019: Right Temple / 60 Gy in 30 fractions  07/2008 - 08/2008: Prostate Fossa / 68.4 Gy in 38 fractions   Narrative:  I spoke with the patient to conduct his routine scheduled 6 month follow up visit to review results of his recent MRI face/trigeminal nerve scan via telephone to spare the patient unnecessary potential exposure in the healthcare setting during the current COVID-19 pandemic.  The patient was notified in advance and gave permission to proceed with this visit format.  He tolerated radiation treatment relatively well but continues with dense numbness in the V2 distribution. A few times, he has called with concerns of an increase in pressure and numbness felt in his face on the right and towards his lip so he had a short interval follow up scan for evaluation, in light of his prior disease recurrence.  Subsequent follow-up scans have confirmed no progressive disease at the right V2 segment of the trigeminal nerve and no evidence of intracranial involvement.  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 His imaging was reviewed in the multidisciplinary brain tumor board and the micro-hemorrhages were not felt to be concerning as they are usually asymptomatic and these patients do have greater tendency to hemorrhage spontaneously or with anticoagulation.   He asked to be seen today to discuss his ongoing facial sensation issues.                              On review of systems, the patient states that he is doing well in general.  He continues with the right sided facial numbness but this has not changed significantly.  He reports that the symptoms remain variable, with some days better than others, occasionally with more pronounced numbness on the right side of his face and lip and less sensation in the right temple area. He did switch toothpaste recently and noted some increased irritation along the upper and lower gumline, on the right specifically, with brushing. He has since discontinued the toothpaste and has noted some gradual improvement. He denies any headaches, nausea, vomiting, dizziness or imbalance.  Overall, he remains pleased with his progress to date and currently without complaints.  ALLERGIES:  is allergic to amoxicillin, sulfamethoxazole, and sulfamethoxazole-trimethoprim.  Meds: Current Outpatient Medications  Medication Sig Dispense Refill   alendronate (FOSAMAX) 70 MG tablet Take by mouth.     aspirin EC 81 MG tablet Take  81 mg by mouth daily. Swallow whole.     atorvastatin (LIPITOR) 40 MG tablet Take 1 tablet (40 mg total) by mouth daily. 90 tablet 0   calcium-vitamin D (OSCAL WITH D) 500-200 MG-UNIT tablet Take 1 tablet by mouth.      escitalopram (LEXAPRO) 10 MG tablet Take 5 mg by mouth daily.     LUMIGAN 0.01 % SOLN Place 1 drop into the left eye at bedtime.     melatonin 1 MG TABS tablet Take 2 mg by mouth at bedtime.     NON FORMULARY Vitamin Code Grow  Bone Calcium     NON FORMULARY Nature Sunshine Immune Stimulator daily     NON FORMULARY Nature Sunshine Skeletal strength daily     NON FORMULARY Growth factor daily     NON FORMULARY Q-absorb Co-Q10     NON FORMULARY Apex Energetics Adaptocrine     NON FORMULARY Apex Energetics Gabatone     NON FORMULARY Apex Energetics AD-Pro (bitamin A and D)     Probiotic Product (SOLUBLE FIBER/PROBIOTICS PO) Take by mouth.     No current facility-administered medications for this encounter.    Physical Findings: Per Dr. Barbaraann Cao KPS: 90. General: Alert, cooperative, pleasant, in no acute distress Head: Normal EENT: No conjunctival injection or scleral icterus.  Lungs: Resp effort normal Cardiac: Regular rate Abdomen: Non-distended abdomen Skin: No rashes cyanosis or petechiae. Extremities: No clubbing or edema   Neurologic Exam: Mental Status: Awake, alert, attentive to examiner. Oriented to self and environment. Language is fluent with intact comprehension.  Cranial Nerves: Visual acuity is grossly normal in left eye, right eye prosthetic. Visual fields are full. Extra-ocular movements intact. R eye ptotic. Face is symmetric, numbness along R V2/V3 territories. Motor: Tone and bulk are normal. Power is full in both arms and legs. Reflexes are symmetric, no pathologic reflexes present.  Sensory: Intact to light touch Gait: Normal.  Lab Findings: Lab Results  Component Value Date   WBC 4.0 12/31/2019   HGB 13.4 12/31/2019   HCT 40.1 12/31/2019   PLT 210 12/31/2019    Lab Results  Component Value Date   NA 140 12/31/2019   K 3.7 12/31/2019   CO2 24 12/31/2019   GLUCOSE 97 12/31/2019   BUN 20 11/30/2020   CREATININE 0.77 11/30/2020   BILITOT 0.9 12/30/2019   ALKPHOS 56 12/30/2019   AST 14 (L) 12/30/2019   ALT 13 12/30/2019   PROT 5.5 (L) 12/30/2019   ALBUMIN 3.4 (L) 12/30/2019   CALCIUM 8.6 (L) 12/31/2019   ANIONGAP 8 12/31/2019    Radiographic Findings: NM PET (PSMA)  SKULL TO MID THIGH Result Date: 08/24/2023 CLINICAL DATA:  Prostate carcinoma with biochemical recurrence. EXAM: NUCLEAR MEDICINE PET SKULL BASE TO THIGH TECHNIQUE: 8.2 mCi Flotufolastat (Posluma) was injected intravenously. Full-ring PET imaging was performed from the skull base to thigh after the radiotracer. CT data was obtained and used for attenuation correction and anatomic localization. COMPARISON:  CT 12/22/2008 FINDINGS: NECK No radiotracer activity in neck lymph nodes. Incidental CT finding: None. CHEST No radiotracer accumulation within mediastinal or hilar lymph nodes. No suspicious pulmonary nodules on the CT scan. Incidental CT finding: None. ABDOMEN/PELVIS Prostate: No focal activity in prostatectomy bed. Lymph nodes: No abnormal radiotracer accumulation within pelvic or abdominal nodes. Liver: No evidence of liver metastasis. Incidental CT finding: Enlargement of the LEFT adrenal gland to 2.3 cm (image 110/series 4) the enlarged gland has portions of low density (HU equal -13) which is most  consistent benign adrenal lipoma adenoma. No change in size from CT 10/17/2008. 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 SKELETON No focal activity to suggest skeletal metastasis. IMPRESSION: 1. No evidence of prostate cancer recurrence in the prostatectomy bed. 2. No evidence of metastatic adenopathy in the pelvis or periaortic retroperitoneum. 3. No evidence of visceral metastasis or skeletal metastasis. 4. Long-term stability LEFT adrenal gland enlargement with history of prior LEFT adrenal hematoma. Favor underlying adenoma. Electronically Signed   By: Genevive Bi M.D.   On: 08/24/2023 09:14     Impression/Plan: This visit was conducted via telephone to spare the patient unnecessary potential exposure in the healthcare setting during the current COVID-19 pandemic.  78 y.o. gentleman with Stage T2b invasive, cutaneous Squamous Cell Carcinoma of the Right [place] now with Trigeminal Nerve  Involvement up to [facility]. He has recovered well from the effects of his recent [facility] and remains without new complaints.  His recent MRI trigeminal nerve study confirms a stable appearance with no progressive disease at the right V2 segment of the trigeminal nerve and no evidence of intracranial involvement.  We will continue MRI surveillance.  Today he is also meeting with Dr. Barbaraann Cao to discuss neuropathy post radiation to discuss whether medical therapy is warranted at this time or in the future   I personally spent 15 minutes in this encounter including chart review, reviewing radiological studies, meeting face-to-face with the patient, entering orders and completing documentation.   ------------------------------------------------   Margaretmary Dys, MD [facility]  Radiation Oncology Direct Dial: [phone]  Fax: [phone] [email or web address]  Skype  LinkedIn

## 2023-09-04 NOTE — Progress Notes (Signed)
[facility] at [facility] [address]  [place], [place] [address] [phone]   New Patient Evaluation  Date of Service: 09/04/23 Patient Name: Paul Vasquez. Patient MRN: [id number] Patient DOB: 05-13-1946 Provider: Henreitta Leber, MD  Identifying Statement:  Paul Vasquez. is a 78 y.o. male with Cancer of cranial nerves [facility]) who presents for initial consultation and evaluation regarding cancer associated neurologic deficits.    Referring Provider: [facility] @ [place] [address] [place],  [place] [address]  Primary Cancer: squamous skin  Oncologic History: Oncology History   No history exists.    History of Present Illness: The patient's records from the referring physician were obtained and reviewed and the patient interviewed to confirm this HPI.  Paul Vasquez. Presents today for follow up after recent MRI trigeminal protocol.  He describes ongoing right sided facial numbness, not significantly changed from prior.  He does acknowledge "a few epsides of facial pain", described as electric shock in right side of face, mid and lower.  No double vision, facial weakness.  Continues to follow with dermatology for skin cancers.  Medications: Current Outpatient Medications on File Prior to Visit  Medication Sig Dispense Refill   aspirin EC 81 MG tablet Take 81 mg by mouth daily. Swallow whole.     atorvastatin (LIPITOR) 40 MG tablet Take 1 tablet (40 mg total) by mouth daily. 90 tablet 0   calcium-vitamin D (OSCAL WITH D) 500-200 MG-UNIT tablet Take 1 tablet by mouth.      escitalopram (LEXAPRO) 10 MG tablet Take 10 mg by mouth daily.     melatonin 1 MG TABS tablet Take 2 mg by mouth at bedtime.     NON FORMULARY Vitamin Code Grow Bone Calcium     NON FORMULARY Nature Sunshine Immune Stimulator daily     NON FORMULARY Nature Sunshine Skeletal strength daily     NON FORMULARY Growth factor daily      NON FORMULARY Wheat grass daily     NON FORMULARY Q-absorb Co-Q10     NON FORMULARY Apex Energetics Adaptocrine     NON FORMULARY Apex Energetics Gabatone     NON FORMULARY Apex Energetics AD-Pro (bitamin A and D)     NON FORMULARY [place] Prolent      Omega-3 Fatty Acids (OMEGA-3 EPA FISH OIL PO) Take by mouth.     Probiotic Product (SOLUBLE FIBER/PROBIOTICS PO) Take by mouth.     vitamin C (ASCORBIC ACID) 500 MG tablet Take 500 mg by mouth 2 (two) times daily.      No current facility-administered medications on file prior to visit.    Allergies:  Allergies  Allergen Reactions   Amoxicillin Other (See Comments)    un   Sulfamethoxazole Other (See Comments)    un   Sulfamethoxazole-Trimethoprim Other (See Comments)   Past Medical History:  Past Medical History:  Diagnosis Date   Macular degeneration 02/2020   start   Melanoma in situ of ear, right (HCC)    Prostate cancer (HCC)    Skin cancer    Past Surgical History:  Past Surgical History:  Procedure Laterality Date   MOHS SURGERY     PROSTATECTOMY     Social History:  Social History   Socioeconomic History   Marital status: Widowed    Spouse name: Not on file   Number of children: Not on file   Years of education: Not on file  Highest education level: Not on file  Occupational History    Comment: retired  Tobacco Use   Smoking status: Never   Smokeless tobacco: Never  Vaping Use   Vaping status: Never Used  Substance and Sexual Activity   Alcohol use: Not Currently   Drug use: Never   Sexual activity: Not Currently  Other Topics Concern   Not on file  Social History Narrative   Not on file   Social Drivers of Health   Financial Resource Strain: Not on file  Food Insecurity: No Food Insecurity (01/15/2023)   Hunger Vital Sign    Worried About Running Out of Food in the Last Year: Never true    Ran Out of Food in the Last Year: Never true  Transportation Needs: No Transportation Needs  (01/15/2023)   PRAPARE - Administrator, Civil Service (Medical): No    Lack of Transportation (Non-Medical): No  Physical Activity: Not on file  Stress: Not on file  Social Connections: Not on file  Intimate Partner Violence: Not At Risk (01/15/2023)   Humiliation, Afraid, Rape, and Kick questionnaire    Fear of Current or Ex-Partner: No    Emotionally Abused: No    Physically Abused: No    Sexually Abused: No   Family History:  Family History  Problem Relation Age of Onset   Multiple myeloma Father     Review of Systems: Constitutional: Doesn't report fevers, chills or abnormal weight loss Eyes: Doesn't report blurriness of vision Ears, nose, mouth, throat, and face: Doesn't report sore throat Respiratory: Doesn't report cough, dyspnea or wheezes Cardiovascular: Doesn't report palpitation, chest discomfort  Gastrointestinal:  Doesn't report nausea, constipation, diarrhea GU: Doesn't report incontinence Skin: Doesn't report skin rashes Neurological: Per HPI Musculoskeletal: Doesn't report joint pain Behavioral/Psych: Doesn't report anxiety  Physical Exam: Vitals:   09/04/23 1447  BP: 134/74  Pulse: 64  Resp: 17  Temp: 97.9 F (36.6 C)  SpO2: 100%   KPS: 90. General: Alert, cooperative, pleasant, in no acute distress Head: Normal EENT: No conjunctival injection or scleral icterus.  Lungs: Resp effort normal Cardiac: Regular rate Abdomen: Non-distended abdomen Skin: No rashes cyanosis or petechiae. Extremities: No clubbing or edema  Neurologic Exam: Mental Status: Awake, alert, attentive to examiner. Oriented to self and environment. Language is fluent with intact comprehension.  Cranial Nerves: Visual acuity is grossly normal in left eye, right eye prosthetic. Visual fields are full. Extra-ocular movements intact. R eye ptotic. Face is symmetric, numbness along R V2/V3 territories. Motor: Tone and bulk are normal. Power is full in both arms and legs.  Reflexes are symmetric, no pathologic reflexes present.  Sensory: Intact to light touch Gait: Normal.   Labs: I have reviewed the data as listed    Component Value Date/Time   NA 140 12/31/2019 0338   K 3.7 12/31/2019 0338   CL 108 12/31/2019 0338   CO2 24 12/31/2019 0338   GLUCOSE 97 12/31/2019 0338   BUN 20 11/30/2020 1109   CREATININE 0.77 11/30/2020 1109   CALCIUM 8.6 (L) 12/31/2019 0338   PROT 5.5 (L) 12/30/2019 0512   ALBUMIN 3.4 (L) 12/30/2019 0512   AST 14 (L) 12/30/2019 0512   ALT 13 12/30/2019 0512   ALKPHOS 56 12/30/2019 0512   BILITOT 0.9 12/30/2019 0512   GFRNONAA >60 11/30/2020 1109   GFRAA >60 12/31/2019 0338   Lab Results  Component Value Date   WBC 4.0 12/31/2019   NEUTROABS 2.5 12/31/2019   HGB  13.4 12/31/2019   HCT 40.1 12/31/2019   MCV 96.4 12/31/2019   PLT 210 12/31/2019    Imaging:  NM PET (PSMA) SKULL TO MID THIGH Result Date: 08/24/2023 CLINICAL DATA:  Prostate carcinoma with biochemical recurrence. EXAM: NUCLEAR MEDICINE PET SKULL BASE TO THIGH TECHNIQUE: 8.2 mCi Flotufolastat (Posluma) was injected intravenously. Full-ring PET imaging was performed from the skull base to thigh after the radiotracer. CT data was obtained and used for attenuation correction and anatomic localization. COMPARISON:  CT 12/22/2008 FINDINGS: NECK No radiotracer activity in neck lymph nodes. Incidental CT finding: None. CHEST No radiotracer accumulation within mediastinal or hilar lymph nodes. No suspicious pulmonary nodules on the CT scan. Incidental CT finding: None. ABDOMEN/PELVIS Prostate: No focal activity in prostatectomy bed. Lymph nodes: No abnormal radiotracer accumulation within pelvic or abdominal nodes. Liver: No evidence of liver metastasis. Incidental CT finding: Enlargement of the LEFT adrenal gland to 2.3 cm (image 110/series 4) the enlarged gland has portions of low density (HU equal -13) which is most consistent benign adrenal lipoma adenoma. No change in  size from CT 10/17/2008. 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 SKELETON No focal activity to suggest skeletal metastasis. IMPRESSION: 1. No evidence of prostate cancer recurrence in the prostatectomy bed. 2. No evidence of metastatic adenopathy in the pelvis or periaortic retroperitoneum. 3. No evidence of visceral metastasis or skeletal metastasis. 4. Long-term stability LEFT adrenal gland enlargement with history of prior LEFT adrenal hematoma. Favor underlying adenoma. Electronically Signed   By: Genevive Bi M.D.   On: 08/24/2023 09:14    [facility] Clinician Interpretation: I have personally reviewed the radiological images as listed.  My interpretation, in the context of the patient's clinical presentation, is stable disease   Assessment/Plan Cancer of cranial nerves (HCC)  Paul Vasquez. Is clinically stable today. MRI trigeminal protocol does not demonstrate any progression of disease.  Small focus of enhancement within oral cavity is of unclear significance.   Radiation oncology will con't to follow serially with imaging as scheduled.  He is not interested in any medications for trigeminal neuropathy/pain at this time.  We spent twenty additional minutes teaching regarding the natural history, biology, and historical experience in the treatment of neurologic complications of cancer.   We appreciate the opportunity to participate in the care of Paul Vasquez.Marland Kitchen  He may follow up as needed.  All questions were answered. The patient knows to call the clinic with any problems, questions or concerns. No barriers to learning were detected.  The total time spent in the encounter was 45 minutes and more than 50% was on counseling and review of test results   Henreitta Leber, MD Medical Director of Neuro-Oncology [facility] [place] 09/04/23 2:59 PM

## 2023-09-09 ENCOUNTER — Other Ambulatory Visit: Payer: Self-pay | Admitting: Radiation Therapy

## 2023-09-11 DIAGNOSIS — S0571XD Avulsion of right eye, subsequent encounter: Secondary | ICD-10-CM | POA: Diagnosis not present

## 2023-09-26 DIAGNOSIS — R7303 Prediabetes: Secondary | ICD-10-CM | POA: Diagnosis not present

## 2023-09-26 DIAGNOSIS — Z8673 Personal history of transient ischemic attack (TIA), and cerebral infarction without residual deficits: Secondary | ICD-10-CM | POA: Diagnosis not present

## 2023-09-26 DIAGNOSIS — E559 Vitamin D deficiency, unspecified: Secondary | ICD-10-CM | POA: Diagnosis not present

## 2023-10-07 DIAGNOSIS — Z8673 Personal history of transient ischemic attack (TIA), and cerebral infarction without residual deficits: Secondary | ICD-10-CM | POA: Diagnosis not present

## 2023-10-07 DIAGNOSIS — E559 Vitamin D deficiency, unspecified: Secondary | ICD-10-CM | POA: Diagnosis not present

## 2023-10-07 DIAGNOSIS — F331 Major depressive disorder, recurrent, moderate: Secondary | ICD-10-CM | POA: Diagnosis not present

## 2023-10-07 DIAGNOSIS — Z6823 Body mass index (BMI) 23.0-23.9, adult: Secondary | ICD-10-CM | POA: Diagnosis not present

## 2023-10-07 DIAGNOSIS — Z0000 Encounter for general adult medical examination without abnormal findings: Secondary | ICD-10-CM | POA: Diagnosis not present

## 2023-10-07 DIAGNOSIS — R7303 Prediabetes: Secondary | ICD-10-CM | POA: Diagnosis not present

## 2023-10-20 DIAGNOSIS — C61 Malignant neoplasm of prostate: Secondary | ICD-10-CM | POA: Diagnosis not present

## 2023-12-15 DIAGNOSIS — G510 Bell's palsy: Secondary | ICD-10-CM | POA: Diagnosis not present

## 2023-12-15 DIAGNOSIS — E559 Vitamin D deficiency, unspecified: Secondary | ICD-10-CM | POA: Diagnosis not present

## 2023-12-15 DIAGNOSIS — Z0000 Encounter for general adult medical examination without abnormal findings: Secondary | ICD-10-CM | POA: Diagnosis not present

## 2023-12-15 DIAGNOSIS — Z1331 Encounter for screening for depression: Secondary | ICD-10-CM | POA: Diagnosis not present

## 2023-12-15 DIAGNOSIS — Z6823 Body mass index (BMI) 23.0-23.9, adult: Secondary | ICD-10-CM | POA: Diagnosis not present

## 2023-12-30 DIAGNOSIS — H2512 Age-related nuclear cataract, left eye: Secondary | ICD-10-CM | POA: Diagnosis not present

## 2023-12-30 DIAGNOSIS — H353122 Nonexudative age-related macular degeneration, left eye, intermediate dry stage: Secondary | ICD-10-CM | POA: Diagnosis not present

## 2023-12-30 DIAGNOSIS — Z970 Presence of artificial eye: Secondary | ICD-10-CM | POA: Diagnosis not present

## 2023-12-30 DIAGNOSIS — H04122 Dry eye syndrome of left lacrimal gland: Secondary | ICD-10-CM | POA: Diagnosis not present

## 2023-12-30 DIAGNOSIS — H40052 Ocular hypertension, left eye: Secondary | ICD-10-CM | POA: Diagnosis not present

## 2024-01-08 DIAGNOSIS — H903 Sensorineural hearing loss, bilateral: Secondary | ICD-10-CM | POA: Diagnosis not present

## 2024-01-19 ENCOUNTER — Ambulatory Visit
Admission: RE | Admit: 2024-01-19 | Discharge: 2024-01-19 | Disposition: A | Discharge status: Home / Self Care | Payer: Medicare PPO | Source: Ambulatory Visit | Attending: Radiation Oncology | Admitting: Radiation Oncology

## 2024-01-19 DIAGNOSIS — C7250 Malignant neoplasm of unspecified cranial nerve: Secondary | ICD-10-CM

## 2024-01-19 DIAGNOSIS — C7931 Secondary malignant neoplasm of brain: Secondary | ICD-10-CM

## 2024-01-19 DIAGNOSIS — G529 Cranial nerve disorder, unspecified: Secondary | ICD-10-CM | POA: Diagnosis not present

## 2024-01-19 MED ORDER — GADOPICLENOL 0.5 MMOL/ML IV SOLN
7.5000 mL | Freq: Once | INTRAVENOUS | Status: AC | PRN
  Administered 2024-01-19: 7.5 mL via INTRAVENOUS

## 2024-01-21 ENCOUNTER — Ambulatory Visit: Payer: Self-pay | Admitting: Radiation Oncology

## 2024-01-21 NOTE — Progress Notes (Signed)
 Please call patient with normal result.  Thanks. MM

## 2024-01-23 ENCOUNTER — Telehealth: Payer: Self-pay

## 2024-01-23 NOTE — Telephone Encounter (Signed)
 RN called [name] per Dr. Lorri Rota to inform him that results from recent MRI was normal.

## 2024-01-27 NOTE — Progress Notes (Signed)
 No answer for this apt. Voicemail left on 01/27/2024 5pm M. Daun Rens-LPN   Telephone nursing appointment for review of most recent MRI-Brain results. I verified patient's identity x2 and began nursing interview.   Patient states issues as follows...   -Fatigue: *** -Hair Loss: *** -Skin: *** -Weakness: *** -Loss of control of extremities: *** -Headache: *** -Seizure/ uncontrolled movement: *** -Vision: *** -Speech: *** -Confusion: *** -Dexamethasone/ steroids: ***   Patient denies any other related issues at this time.   Meaningful use complete.   Patient aware of their telephone appointment w/ Ashlyn Bruning PA-C. I left my extension [phone] in case patient needs anything. Patient verbalized understanding. This concludes the nursing interview.   Patient preferred phone # [phone]

## 2024-01-28 ENCOUNTER — Ambulatory Visit
Admission: RE | Admit: 2024-01-28 | Discharge: 2024-01-28 | Disposition: A | Discharge status: Home / Self Care | Payer: Medicare PPO | Source: Ambulatory Visit | Attending: Urology | Admitting: Urology

## 2024-01-28 DIAGNOSIS — C44320 Squamous cell carcinoma of skin of unspecified parts of face: Secondary | ICD-10-CM

## 2024-01-28 DIAGNOSIS — C7250 Malignant neoplasm of unspecified cranial nerve: Secondary | ICD-10-CM

## 2024-01-28 NOTE — Progress Notes (Signed)
 Radiation Oncology         [phone]) [phone] ________________________________  Name: Paul Vasquez. MRN: [id number]  Date: 01/28/2024  DOB: 1946-03-18  Follow-Up Note  CC: [facility] @ [address], [place] [facility]  Diagnosis:   78 y.o. 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    ICD-10-CM   1. 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 Squamous cell carcinoma of face  C44.320       Interval Since Last Radiation:  3 years 4/29, 5/2, 5/4, 5/6 and 12/18/2020//SRS: The right 10 mm Meckel's cave trigeminal nerve target was treated to a prescription dose of 5 Gy which was repeated for a total of 5 fractions to a total dose of 25 Gy.  05/18/2019 - 06/28/2019: Right Temple / 60 Gy in 30 fractions  07/2008 - 08/2008: Prostate Fossa / 68.4 Gy in 38 fractions   Narrative:  I spoke with the patient to conduct his routine scheduled 6 month follow up visit to review results of his recent MRI face/trigeminal nerve scan via telephone to spare the patient unnecessary potential exposure in the healthcare setting during the current COVID-19 pandemic.  The patient was notified in advance and gave permission to proceed with this visit format.  He tolerated radiation treatment relatively well but continues with dense numbness in the V2 distribution. A few times, he has called with concerns of an increase in pressure and numbness felt in his face on the right and towards his lip so he had a short interval follow up scan for evaluation, in light of his prior disease recurrence.  Subsequent follow-up scans have confirmed no progressive disease at the right V2 segment of the trigeminal nerve and no evidence of intracranial involvement.  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 His imaging was reviewed in the multidisciplinary brain tumor board and the micro-hemorrhages were not felt to be concerning as they are usually asymptomatic and these patients do have greater tendency to hemorrhage spontaneously or with anticoagulation.   He has continued with ongoing facial sensation issues but reports that these are not worsening or changing.                              On review of systems, the patient states that he is doing well in general.  He continues with the right sided facial numbness but this has not changed significantly.  He reports that the symptoms remain variable, with some days better than others, occasionally with more pronounced numbness on the right side of his face and lip and less sensation in the right temple area. He did switch toothpaste recently and noted some increased irritation along the upper and lower gumline, on the right specifically, with brushing. He has since discontinued the toothpaste and has noted some gradual improvement. He denies any headaches, nausea, vomiting, dizziness or imbalance.  Overall, he remains pleased with his progress to date and currently without complaints.  ALLERGIES:  is allergic to amoxicillin, sulfamethoxazole, and sulfamethoxazole-trimethoprim.  Meds: Current Outpatient Medications  Medication Sig Dispense Refill   alendronate (FOSAMAX) 70 MG tablet Take by mouth.     aspirin  EC  81 MG tablet Take 81 mg by mouth daily. Swallow whole.     atorvastatin  (LIPITOR) 40 MG tablet Take 1 tablet (40 mg total) by mouth daily. 90 tablet 0   calcium -vitamin D (OSCAL WITH D) 500-200 MG-UNIT tablet Take 1 tablet by mouth.      escitalopram (LEXAPRO) 10 MG tablet Take 5 mg by mouth daily.     LUMIGAN 0.01 % SOLN Place 1 drop into the left eye at bedtime.     melatonin 1 MG TABS tablet Take 2 mg by mouth at bedtime.      NON FORMULARY Vitamin Code Grow Bone Calcium      NON FORMULARY Nature Sunshine Immune Stimulator daily     NON FORMULARY Nature Sunshine Skeletal strength daily     NON FORMULARY Growth factor daily     NON FORMULARY Q-absorb Co-Q10     NON FORMULARY Apex Energetics Adaptocrine     NON FORMULARY Apex Energetics Gabatone     NON FORMULARY Apex Energet[employer] AD-Pro (bitamin A and D)     Probiotic Product (SOLUBLE FIBER/PROBIOTICS PO) Take by mouth.     No current facility-administered medications for this encounter.   Physical exam not performed due to telephone follow up visit format.  Lab Findings: Lab Results  Component Value Date   WBC 4.0 12/31/2019   HGB 13.4 12/31/2019   HCT 40.1 12/31/2019   PLT 210 12/31/2019    Lab Results  Component Value Date   NA 140 12/31/2019   K 3.7 12/31/2019   CO2 24 12/31/2019   GLUCOSE 97 12/31/2019   BUN 20 11/30/2020   CREATININE 0.77 11/30/2020   BILITOT 0.9 12/30/2019   ALKPHOS 56 12/30/2019   AST 14 (L) 12/30/2019   ALT 13 12/30/2019   PROT 5.5 (L) 12/30/2019   ALBUMIN 3.4 (L) 12/30/2019   CALCIUM  8.6 (L) 12/31/2019   ANIONGAP 8 12/31/2019    Radiographic Findings: MR FACE/TRIGEMINAL WO/W CM Result Date: 01/20/2024 CLINICAL DATA:  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 EXAM: MRI FACE TRIGEMINAL WITHOUT AND WITH CONTRAST TECHNIQUE: Multiplanar, multi-echo pulse sequences of the face and surrounding structures, including thin-slice imaging of the trigeminal nerves, were acquired before and after intravenous contrast administration. CONTRAST:  7.5 cc Vueway  COMPARISON:  Multiple previous exams, most recently 07/14/2023 FINDINGS: Limited intracranial/Trigeminal nerves: Complete brain exam was not performed. Visualized portions of the brain appear unremarkable, with mild age related volume loss. No evidence of discrete metastatic disease or leptomeningeal metastatic disease. There is no evidence  of recurrent/worsening disease affecting the right Meckel's cave or other components of the right fifth nerve. The appearance has been stable over the last several examinations and there is no worsening or new finding. No abnormality on the left. Vascular: Major vessels at the base of the brain show flow. Sinuses/Orbits: Visualized sinuses are clear. Previous functional endoscopic sinus surgery. 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 Left orbit appears unremarkable. Soft tissues: Other regional soft tissues appear normal. Osseous: No evidence of osseous disease. Other: None. IMPRESSION: No evidence of recurrent/worsening disease affecting the right Meckel's cave or other components of the right fifth nerve. The appearance has been stable over the last several examinations and there is no worsening or new finding. Electronically Signed   By: Oneil Officer M.D.   On: 01/20/2024 14:41     Impression/Plan: This visit was conducted via telephone to spare the patient unnecessary potential exposure in the healthcare setting during the current COVID-19  pandemic.  78 y.o. 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 He has recovered well from the effects of his recent [facility] and remains without new complaints.  His recent MRI trigeminal nerve study confirms a stable appearance with no progressive disease at the right V2 segment of the trigeminal nerve and no evidence of intracranial involvement.  We will continue MRI surveillance every 6 months and I will plan to call him following each scan to review results. He knows to call at any time in the interim with any questions or concerns.  I personally spent 20 minutes in this encounter including chart review, reviewing radiological studies, telephone conversation with the patient, entering orders and completing documentation.   Sabra MICAEL Rusk, MMS, PA-C [place]   [facility] Radiation Oncology Physician Assistant Direct Dial: [phone]  Fax: [phone]

## 2024-02-02 DIAGNOSIS — D2261 Melanocytic nevi of right upper limb, including shoulder: Secondary | ICD-10-CM | POA: Diagnosis not present

## 2024-02-02 DIAGNOSIS — D225 Melanocytic nevi of trunk: Secondary | ICD-10-CM | POA: Diagnosis not present

## 2024-02-02 DIAGNOSIS — L570 Actinic keratosis: Secondary | ICD-10-CM | POA: Diagnosis not present

## 2024-02-02 DIAGNOSIS — Z85820 Personal history of malignant melanoma of skin: Secondary | ICD-10-CM | POA: Diagnosis not present

## 2024-02-02 DIAGNOSIS — Z85828 Personal history of other malignant neoplasm of skin: Secondary | ICD-10-CM | POA: Diagnosis not present

## 2024-02-02 DIAGNOSIS — D692 Other nonthrombocytopenic purpura: Secondary | ICD-10-CM | POA: Diagnosis not present

## 2024-02-02 DIAGNOSIS — L905 Scar conditions and fibrosis of skin: Secondary | ICD-10-CM | POA: Diagnosis not present

## 2024-02-02 DIAGNOSIS — L821 Other seborrheic keratosis: Secondary | ICD-10-CM | POA: Diagnosis not present

## 2024-02-02 DIAGNOSIS — D2262 Melanocytic nevi of left upper limb, including shoulder: Secondary | ICD-10-CM | POA: Diagnosis not present

## 2024-02-11 ENCOUNTER — Other Ambulatory Visit: Payer: Self-pay | Admitting: Radiation Therapy

## 2024-02-11 DIAGNOSIS — C44320 Squamous cell carcinoma of skin of unspecified parts of face: Secondary | ICD-10-CM

## 2024-04-19 DIAGNOSIS — F331 Major depressive disorder, recurrent, moderate: Secondary | ICD-10-CM | POA: Diagnosis not present

## 2024-04-19 DIAGNOSIS — Z1331 Encounter for screening for depression: Secondary | ICD-10-CM | POA: Diagnosis not present

## 2024-04-19 DIAGNOSIS — K6289 Other specified diseases of anus and rectum: Secondary | ICD-10-CM | POA: Diagnosis not present

## 2024-04-20 ENCOUNTER — Other Ambulatory Visit

## 2024-05-04 DIAGNOSIS — C61 Malignant neoplasm of prostate: Secondary | ICD-10-CM | POA: Diagnosis not present

## 2024-05-11 DIAGNOSIS — C61 Malignant neoplasm of prostate: Secondary | ICD-10-CM | POA: Diagnosis not present

## 2024-05-26 ENCOUNTER — Other Ambulatory Visit (HOSPITAL_COMMUNITY): Payer: Self-pay | Admitting: Urology

## 2024-05-26 DIAGNOSIS — H353122 Nonexudative age-related macular degeneration, left eye, intermediate dry stage: Secondary | ICD-10-CM | POA: Diagnosis not present

## 2024-05-26 DIAGNOSIS — H40052 Ocular hypertension, left eye: Secondary | ICD-10-CM | POA: Diagnosis not present

## 2024-05-26 DIAGNOSIS — H2512 Age-related nuclear cataract, left eye: Secondary | ICD-10-CM | POA: Diagnosis not present

## 2024-05-26 DIAGNOSIS — C61 Malignant neoplasm of prostate: Secondary | ICD-10-CM

## 2024-06-03 ENCOUNTER — Encounter (HOSPITAL_COMMUNITY)
Admission: RE | Admit: 2024-06-03 | Discharge: 2024-06-03 | Disposition: A | Discharge status: Home / Self Care | Source: Ambulatory Visit | Attending: Urology | Admitting: Urology

## 2024-06-03 DIAGNOSIS — C61 Malignant neoplasm of prostate: Secondary | ICD-10-CM | POA: Insufficient documentation

## 2024-06-03 MED ORDER — FLOTUFOLASTAT F 18 GALLIUM 296-5846 MBQ/ML IV SOLN
8.0700 | Freq: Once | INTRAVENOUS | Status: AC
  Administered 2024-06-03: 8.07 via INTRAVENOUS
  Filled 2024-06-03: qty 9

## 2024-06-28 DIAGNOSIS — Z970 Presence of artificial eye: Secondary | ICD-10-CM | POA: Diagnosis not present

## 2024-06-28 DIAGNOSIS — H40052 Ocular hypertension, left eye: Secondary | ICD-10-CM | POA: Diagnosis not present

## 2024-06-28 DIAGNOSIS — H04122 Dry eye syndrome of left lacrimal gland: Secondary | ICD-10-CM | POA: Diagnosis not present

## 2024-06-28 DIAGNOSIS — H2512 Age-related nuclear cataract, left eye: Secondary | ICD-10-CM | POA: Diagnosis not present

## 2024-07-20 ENCOUNTER — Inpatient Hospital Stay
Admission: RE | Admit: 2024-07-20 | Discharge: 2024-07-20 | Attending: Radiation Oncology | Admitting: Radiation Oncology

## 2024-07-20 DIAGNOSIS — C799 Secondary malignant neoplasm of unspecified site: Secondary | ICD-10-CM | POA: Diagnosis not present

## 2024-07-20 DIAGNOSIS — C44320 Squamous cell carcinoma of skin of unspecified parts of face: Secondary | ICD-10-CM

## 2024-07-20 MED ORDER — GADOPICLENOL 0.5 MMOL/ML IV SOLN
7.0000 mL | Freq: Once | INTRAVENOUS | Status: AC | PRN
  Administered 2024-07-20: 7 mL via INTRAVENOUS

## 2024-07-21 NOTE — Progress Notes (Signed)
 Follow up call to discuss face/trigeminal MRI on 07/20/24:  Patient denies any facial pain or headaches. He does still have some facial numbness from radiation. No other complaints today.   IMPRESSION: 1. No evidence of recurrent or worsening disease involving the treated right Meckel's cave. Stable exam.

## 2024-07-26 ENCOUNTER — Encounter

## 2024-07-28 ENCOUNTER — Encounter: Payer: Self-pay | Admitting: Urology

## 2024-07-28 ENCOUNTER — Other Ambulatory Visit: Payer: Self-pay | Admitting: Radiation Therapy

## 2024-07-28 ENCOUNTER — Inpatient Hospital Stay: Admission: RE | Admit: 2024-07-28 | Discharge: 2024-07-28 | Attending: Urology | Admitting: Urology

## 2024-07-28 DIAGNOSIS — C61 Malignant neoplasm of prostate: Secondary | ICD-10-CM

## 2024-07-28 DIAGNOSIS — C7250 Malignant neoplasm of unspecified cranial nerve: Secondary | ICD-10-CM

## 2024-07-28 NOTE — Progress Notes (Signed)
 Radiation Oncology         [phone] ________________________________  Name: Paul Vasquez. MRN: [id number]  Date: 07/28/2024  DOB: June 12, 1946  Follow-Up Note  CC: [facility], [facility] @ [place]  Diagnosis:   78 y.o. 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    ICD-10-CM   1. 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 Malignant neoplasm of prostate (HCC)  C61       Interval Since Last Radiation:  3.5 years 4/29, 5/2, 5/4, 5/6 and 12/18/2020//SRS: The right 10 mm Meckel's cave trigeminal nerve target was treated to a prescription dose of 5 Gy which was repeated for a total of 5 fractions to a total dose of 25 Gy.  05/18/2019 - 06/28/2019: Right Temple / 60 Gy in 30 fractions  07/2008 - 08/2008: Prostate Fossa / 68.4 Gy in 38 fractions   Narrative:  I spoke with the patient to conduct his routine scheduled 6 month follow up visit to review results of his recent MRI face/trigeminal nerve scan via telephone to spare the patient unnecessary potential exposure in the healthcare setting during the current COVID-19 pandemic.  The patient was notified in advance and gave permission to proceed with this visit format.  He tolerated radiation treatment relatively well but continues with dense numbness in the V2 distribution. A few times, he has called with concerns of an increase in pressure and numbness felt in his face on the right and towards his lip so he had a short interval follow up scan for evaluation, in light of his prior disease recurrence.  Subsequent follow-up scans have confirmed no progressive disease at the right V2 segment of the trigeminal nerve and no evidence of intracranial involvement.  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 His imaging was reviewed in the multidisciplinary brain tumor board and the micro-hemorrhages were not felt to be concerning as they are usually asymptomatic and these patients do have greater tendency to hemorrhage spontaneously or with anticoagulation.   He has continued with ongoing facial sensation issues but reports that these are not worsening or changing. His most recent MRI brain scan from 07/20/24 remains stable with no evidence of recurrent or worsening disease involving the treated right Meckel's cave. We reviewed these results by telephone today.                              On review of systems, the patient states that he is doing well in general.  He continues with the right sided facial numbness but this has not changed significantly.  He reports that the symptoms remain variable, with some days better than others, occasionally with more pronounced numbness on the right side of his face and lip and less sensation in the right temple area. He denies any headaches, nausea, vomiting, dizziness or imbalance.  Overall, he remains pleased with his progress to date and currently without complaints.  ALLERGIES:  is allergic to amoxicillin, sulfamethoxazole, and sulfamethoxazole-trimethoprim.  Meds: Current Outpatient Medications  Medication Sig Dispense Refill   alendronate (FOSAMAX) 70 MG tablet Take by mouth.     aspirin  EC 81  MG tablet Take 81 mg by mouth daily. Swallow whole.     atorvastatin  (LIPITOR) 20 MG tablet Take 20 mg by mouth daily.     calcium -vitamin D (OSCAL WITH D) 500-200 MG-UNIT tablet Take 1 tablet by mouth.      escitalopram (LEXAPRO) 10 MG tablet Take 5 mg by mouth daily.     leuprolide, 6 Month, (ELIGARD) 45 MG injection Inject 45 mg into the skin every 6 (six) months.     LUMIGAN 0.01 % SOLN Place 1 drop into the left eye at bedtime.      melatonin 1 MG TABS tablet Take 2 mg by mouth at bedtime.     NON FORMULARY Vitamin Code Grow Bone Calcium      NON FORMULARY Nature Sunshine Immune Stimulator daily     NON FORMULARY Nature Sunshine Skeletal strength daily     NON FORMULARY Growth factor daily     NON FORMULARY Q-absorb Co-Q10     NON FORMULARY Apex Energetics Adaptocrine     NON FORMULARY Apex Energetics Gabatone     NON FORMULARY Apex Energetics AD-Pro (bitamin A and D)     Probiotic Product (SOLUBLE FIBER/PROBIOTICS PO) Take by mouth.     No current facility-administered medications for this encounter.   Physical exam not performed due to telephone follow up visit format.  Lab Findings: Lab Results  Component Value Date   WBC 4.0 12/31/2019   HGB 13.4 12/31/2019   HCT 40.1 12/31/2019   PLT 210 12/31/2019    Lab Results  Component Value Date   NA 140 12/31/2019   K 3.7 12/31/2019   CO2 24 12/31/2019   GLUCOSE 97 12/31/2019   BUN 20 11/30/2020   CREATININE 0.77 11/30/2020   BILITOT 0.9 12/30/2019   ALKPHOS 56 12/30/2019   AST 14 (L) 12/30/2019   ALT 13 12/30/2019   PROT 5.5 (L) 12/30/2019   ALBUMIN 3.4 (L) 12/30/2019   CALCIUM  8.6 (L) 12/31/2019   ANIONGAP 8 12/31/2019    Radiographic Findings: MR FACE/TRIGEMINAL WO/W CM Result Date: 07/21/2024 EXAM: MRI FACE WITHOUT AND WITH CONTRAST 07/20/2024 10:28:04 AM TECHNIQUE: Multiplanar, multisequence MRI of the face was performed without and with intravenous contrast. COMPARISON: MRI face January 19, 2024 CLINICAL HISTORY: Metastatic disease evaluation; 3T SRS Protocol FINDINGS: No evidence of recurrent or worsening disease involving the treated right Meckel's cave. No concerning enhancement. Stable appearence. Normal left Meckel's cave. AERODIGESTIVE TRACT: No mass or edema. SALIVARY GLANDS: Unremarkable. SOFT TISSUES: No acute abnormality or mass. ORBITS: No acute abnormality or mass. Previous right enucleation. SINUSES AND MASTOIDS: Mild bilateral maxillary  sinus mucosal thickening. No mastoid effusions. BRAIN: No acute abnormality. Incompletely imaged. BONES: Normal bone marrow signal. IMPRESSION: 1. No evidence of recurrent or worsening disease involving the treated right Meckel's cave. Stable exam. Electronically signed by: Gilmore Molt MD 07/21/2024 03:43 AM EST RP Workstation: [id number]     Impression/Plan: This visit was conducted via telephone to spare the patient unnecessary potential exposure in the healthcare setting during the current COVID-19 pandemic.  78 y.o. gentleman with Stage T2b invasive, cutaneous Squamous Cell Carcinoma of the Right [place] now with Trigeminal Nerve Involvement up to [facility]. He has recovered well from the effects of his recent [facility] and remains without new complaints.  His recent MRI trigeminal nerve study confirms a stable appearance with no progressive disease at the right V2 segment of the trigeminal nerve and no evidence of intracranial involvement.  We discussed a follow up visit with  Dr. Buckley in the future if symptoms begin to progress, but currently, symptoms are stable. Therefore, we will continue MRI surveillance every 6 months and I will plan to call him following each scan to review results. He knows to call at any time in the interim with any questions or concerns.  I personally spent 20 minutes in this encounter including chart review, reviewing radiological studies, telephone conversation with the patient, entering orders and completing documentation.   Sabra MICAEL Rusk, MMS, PA-C [place]  [facility] Radiation Oncology Physician Assistant Direct Dial: [phone]  Fax: [phone]

## 2025-01-18 ENCOUNTER — Other Ambulatory Visit

## 2025-01-24 ENCOUNTER — Inpatient Hospital Stay

## 2025-01-26 ENCOUNTER — Ambulatory Visit: Admitting: Urology
